# Patient Record
Sex: Female | Born: 1980 | Race: White | Hispanic: No | Marital: Married | Smoking: Current some day smoker
Health system: Southern US, Community
[De-identification: ages and names within clinical notes are randomized; demographics above are authoritative.]

## PROBLEM LIST (undated history)

## (undated) DIAGNOSIS — K449 Diaphragmatic hernia without obstruction or gangrene: Secondary | ICD-10-CM

## (undated) DIAGNOSIS — T8859XA Other complications of anesthesia, initial encounter: Secondary | ICD-10-CM

## (undated) DIAGNOSIS — E259 Adrenogenital disorder, unspecified: Secondary | ICD-10-CM

## (undated) DIAGNOSIS — J189 Pneumonia, unspecified organism: Secondary | ICD-10-CM

## (undated) DIAGNOSIS — J40 Bronchitis, not specified as acute or chronic: Secondary | ICD-10-CM

## (undated) DIAGNOSIS — K589 Irritable bowel syndrome without diarrhea: Secondary | ICD-10-CM

## (undated) DIAGNOSIS — Z720 Tobacco use: Secondary | ICD-10-CM

## (undated) DIAGNOSIS — D3501 Benign neoplasm of right adrenal gland: Secondary | ICD-10-CM

## (undated) DIAGNOSIS — E236 Other disorders of pituitary gland: Secondary | ICD-10-CM

## (undated) DIAGNOSIS — E273 Drug-induced adrenocortical insufficiency: Secondary | ICD-10-CM

## (undated) DIAGNOSIS — J45909 Unspecified asthma, uncomplicated: Secondary | ICD-10-CM

## (undated) DIAGNOSIS — K219 Gastro-esophageal reflux disease without esophagitis: Secondary | ICD-10-CM

## (undated) DIAGNOSIS — R519 Headache, unspecified: Secondary | ICD-10-CM

## (undated) DIAGNOSIS — E279 Disorder of adrenal gland, unspecified: Secondary | ICD-10-CM

## (undated) HISTORY — PX: TUBAL LIGATION: SHX77

## (undated) HISTORY — DX: Irritable bowel syndrome, unspecified: K58.9

## (undated) HISTORY — PX: APPENDECTOMY: SHX54

## (undated) HISTORY — PX: CHOLECYSTECTOMY: SHX55

## (undated) HISTORY — DX: Diaphragmatic hernia without obstruction or gangrene: K44.9

## (undated) HISTORY — PX: HIP SURGERY: SHX245

---

## 2005-05-04 ENCOUNTER — Emergency Department: Payer: Self-pay | Admitting: Internal Medicine

## 2005-05-23 ENCOUNTER — Emergency Department: Payer: Self-pay | Admitting: Emergency Medicine

## 2006-01-07 ENCOUNTER — Emergency Department: Payer: Self-pay | Admitting: Internal Medicine

## 2006-01-12 ENCOUNTER — Emergency Department: Payer: Self-pay | Admitting: Emergency Medicine

## 2006-06-05 ENCOUNTER — Emergency Department: Payer: Self-pay | Admitting: Emergency Medicine

## 2006-09-03 ENCOUNTER — Emergency Department: Payer: Self-pay | Admitting: Emergency Medicine

## 2006-09-03 ENCOUNTER — Other Ambulatory Visit: Payer: Self-pay

## 2006-10-30 ENCOUNTER — Emergency Department: Payer: Self-pay | Admitting: Emergency Medicine

## 2007-05-21 ENCOUNTER — Emergency Department: Payer: Self-pay | Admitting: Emergency Medicine

## 2009-02-10 ENCOUNTER — Ambulatory Visit: Payer: Self-pay | Admitting: Unknown Physician Specialty

## 2009-02-16 ENCOUNTER — Ambulatory Visit: Payer: Self-pay | Admitting: Unknown Physician Specialty

## 2009-10-13 ENCOUNTER — Emergency Department: Payer: Self-pay | Admitting: Emergency Medicine

## 2009-11-09 ENCOUNTER — Emergency Department: Payer: Self-pay | Admitting: Unknown Physician Specialty

## 2009-12-05 ENCOUNTER — Encounter: Payer: Self-pay | Admitting: Obstetrics and Gynecology

## 2010-07-07 ENCOUNTER — Observation Stay: Payer: Self-pay | Admitting: Obstetrics and Gynecology

## 2010-07-08 ENCOUNTER — Inpatient Hospital Stay: Payer: Self-pay

## 2010-07-12 LAB — PATHOLOGY REPORT

## 2013-01-22 DIAGNOSIS — F17200 Nicotine dependence, unspecified, uncomplicated: Secondary | ICD-10-CM | POA: Insufficient documentation

## 2013-07-15 ENCOUNTER — Ambulatory Visit: Payer: Self-pay | Admitting: Orthopedic Surgery

## 2013-07-22 ENCOUNTER — Other Ambulatory Visit: Payer: Self-pay

## 2013-07-22 LAB — CBC WITH DIFFERENTIAL/PLATELET
BASOS ABS: 0 10*3/uL (ref 0.0–0.1)
BASOS PCT: 0.6 %
EOS PCT: 7.8 %
Eosinophil #: 0.5 10*3/uL (ref 0.0–0.7)
HCT: 45.1 % (ref 35.0–47.0)
HGB: 14.7 g/dL (ref 12.0–16.0)
LYMPHS PCT: 41.7 %
Lymphocyte #: 2.8 10*3/uL (ref 1.0–3.6)
MCH: 32.7 pg (ref 26.0–34.0)
MCHC: 32.6 g/dL (ref 32.0–36.0)
MCV: 100 fL (ref 80–100)
MONO ABS: 0.7 x10 3/mm (ref 0.2–0.9)
MONOS PCT: 10.3 %
NEUTROS ABS: 2.6 10*3/uL (ref 1.4–6.5)
Neutrophil %: 39.6 %
Platelet: 253 10*3/uL (ref 150–440)
RBC: 4.51 10*6/uL (ref 3.80–5.20)
RDW: 12.2 % (ref 11.5–14.5)
WBC: 6.6 10*3/uL (ref 3.6–11.0)

## 2013-07-22 LAB — SEDIMENTATION RATE: Erythrocyte Sed Rate: 3 mm/hr (ref 0–20)

## 2014-05-21 ENCOUNTER — Other Ambulatory Visit: Payer: Self-pay | Admitting: Orthopedic Surgery

## 2014-05-21 DIAGNOSIS — M545 Low back pain, unspecified: Secondary | ICD-10-CM

## 2014-05-21 DIAGNOSIS — M5440 Lumbago with sciatica, unspecified side: Principal | ICD-10-CM

## 2014-05-28 ENCOUNTER — Ambulatory Visit
Admission: RE | Admit: 2014-05-28 | Discharge: 2014-05-28 | Disposition: A | Discharge status: Home / Self Care | Payer: BLUE CROSS/BLUE SHIELD | Source: Ambulatory Visit | Attending: Orthopedic Surgery | Admitting: Orthopedic Surgery

## 2014-05-28 DIAGNOSIS — M545 Low back pain, unspecified: Secondary | ICD-10-CM

## 2014-05-28 DIAGNOSIS — M5127 Other intervertebral disc displacement, lumbosacral region: Secondary | ICD-10-CM | POA: Diagnosis not present

## 2014-05-28 DIAGNOSIS — M5440 Lumbago with sciatica, unspecified side: Secondary | ICD-10-CM

## 2014-12-07 DIAGNOSIS — M25851 Other specified joint disorders, right hip: Secondary | ICD-10-CM | POA: Insufficient documentation

## 2015-03-11 DIAGNOSIS — E259 Adrenogenital disorder, unspecified: Secondary | ICD-10-CM | POA: Insufficient documentation

## 2015-03-23 DIAGNOSIS — Z9889 Other specified postprocedural states: Secondary | ICD-10-CM | POA: Insufficient documentation

## 2015-05-06 DIAGNOSIS — S73192A Other sprain of left hip, initial encounter: Secondary | ICD-10-CM | POA: Insufficient documentation

## 2017-01-03 ENCOUNTER — Emergency Department: Payer: BLUE CROSS/BLUE SHIELD

## 2017-01-03 ENCOUNTER — Emergency Department
Admission: EM | Admit: 2017-01-03 | Discharge: 2017-01-03 | Disposition: A | Discharge status: Home / Self Care | Payer: BLUE CROSS/BLUE SHIELD | Attending: Emergency Medicine | Admitting: Emergency Medicine

## 2017-01-03 ENCOUNTER — Other Ambulatory Visit: Payer: Self-pay

## 2017-01-03 DIAGNOSIS — B9689 Other specified bacterial agents as the cause of diseases classified elsewhere: Secondary | ICD-10-CM | POA: Insufficient documentation

## 2017-01-03 DIAGNOSIS — F17200 Nicotine dependence, unspecified, uncomplicated: Secondary | ICD-10-CM | POA: Insufficient documentation

## 2017-01-03 DIAGNOSIS — R05 Cough: Secondary | ICD-10-CM | POA: Diagnosis not present

## 2017-01-03 DIAGNOSIS — R0789 Other chest pain: Secondary | ICD-10-CM | POA: Diagnosis present

## 2017-01-03 DIAGNOSIS — N760 Acute vaginitis: Secondary | ICD-10-CM | POA: Diagnosis not present

## 2017-01-03 HISTORY — DX: Disorder of adrenal gland, unspecified: E27.9

## 2017-01-03 LAB — CBC
HEMATOCRIT: 42.3 % (ref 35.0–47.0)
Hemoglobin: 14.5 g/dL (ref 12.0–16.0)
MCH: 33.9 pg (ref 26.0–34.0)
MCHC: 34.2 g/dL (ref 32.0–36.0)
MCV: 99.2 fL (ref 80.0–100.0)
Platelets: 306 10*3/uL (ref 150–440)
RBC: 4.26 MIL/uL (ref 3.80–5.20)
RDW: 12.5 % (ref 11.5–14.5)
WBC: 14.3 10*3/uL — ABNORMAL HIGH (ref 3.6–11.0)

## 2017-01-03 LAB — URINALYSIS, ROUTINE W REFLEX MICROSCOPIC
BILIRUBIN URINE: NEGATIVE
GLUCOSE, UA: NEGATIVE mg/dL
HGB URINE DIPSTICK: NEGATIVE
Ketones, ur: NEGATIVE mg/dL
Leukocytes, UA: NEGATIVE
Nitrite: NEGATIVE
PH: 7 (ref 5.0–8.0)
Protein, ur: NEGATIVE mg/dL
Specific Gravity, Urine: 1.018 (ref 1.005–1.030)

## 2017-01-03 LAB — WET PREP, GENITAL
Sperm: NONE SEEN
Trich, Wet Prep: NONE SEEN
Yeast Wet Prep HPF POC: NONE SEEN

## 2017-01-03 LAB — TROPONIN I: Troponin I: <0.03 ng/mL (ref <0.03)

## 2017-01-03 LAB — BASIC METABOLIC PANEL
Anion gap: 8 (ref 5–15)
BUN: 7 mg/dL (ref 6–20)
CHLORIDE: 103 mmol/L (ref 101–111)
CO2: 26 mmol/L (ref 22–32)
CREATININE: 0.66 mg/dL (ref 0.44–1.00)
Calcium: 9.5 mg/dL (ref 8.9–10.3)
GFR calc Af Amer: >60 mL/min (ref >60)
GFR calc non Af Amer: >60 mL/min (ref >60)
GLUCOSE: 100 mg/dL — AB (ref 65–99)
POTASSIUM: 3.3 mmol/L — AB (ref 3.5–5.1)
Sodium: 137 mmol/L (ref 135–145)

## 2017-01-03 LAB — POCT PREGNANCY, URINE: PREG TEST UR: NEGATIVE

## 2017-01-03 LAB — FIBRIN DERIVATIVES D-DIMER (ARMC ONLY): Fibrin derivatives D-dimer (ARMC): 258.9 ng/mL (FEU) (ref 0.00–499.00)

## 2017-01-03 LAB — CHLAMYDIA/NGC RT PCR (ARMC ONLY)
CHLAMYDIA TR: NOT DETECTED
N GONORRHOEAE: NOT DETECTED

## 2017-01-03 MED ORDER — METRONIDAZOLE 500 MG PO TABS: 500.0000 mg | ORAL_TABLET | Freq: Two times a day (BID) | ORAL | 0 refills | Status: AC

## 2017-01-03 NOTE — ED Provider Notes (Signed)
[facility] Emergency Department Provider Note  Time seen: 4:39 PM  I have reviewed the triage vital signs and the nursing notes.   HISTORY  Chief Complaint Flank Pain    HPI Margaret Gates is a 37 y.o. female with no significant past medical history who presents to the emergency department for right rib pain.  According to the patient for the past 2 weeks she has been experiencing pain in the right lateral rib cage, worse with deep inspiration or cough.  Also states it is worse if you push on the area or if she lies on that side.  Denies any shortness of breath.  Denies any nausea diaphoresis.  Denies any fever.  Does state occasional cough but denies sputum production, states chronic cough due to smoking.  As a secondary complaint she has also noted some clear pink tinged vaginal discharge, denies any lower abdominal pain.  Negative for dysuria.  Past Medical History:  Diagnosis Date  . 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   Past Surgical History:  Procedure Laterality Date  . CESAREAN SECTION    . HIP SURGERY      Prior to Admission medications   Not on File    Allergies  Allergen Reactions  . Penicillins     History reviewed. No pertinent family history.  Social History Social History   Tobacco Use  . Smoking status: Current Every Day Smoker  Substance Use Topics  . Alcohol use: No    Frequency: Never  . Drug use: Not on file    Review of Systems Constitutional: Negative for fever. Eyes: Negative for visual complaints ENT: Negative for congestion Cardiovascular: Right lower chest pain Respiratory: Negative for shortness of breath.  Positive for occasional cough Gastrointestinal: Negative for abdominal pain, vomiting and diarrhea. Genitourinary: Negative for dysuria.  Positive for pink/clear vaginal discharge Musculoskeletal: Negative for back pain. Skin: Negative for  rash. Neurological: Negative for headache All other ROS negative  ____________________________________________   PHYSICAL EXAM:  VITAL SIGNS: ED Triage Vitals  Enc Vitals Group     BP 01/03/17 1407 131/89     Pulse Rate 01/03/17 1407 (!) 105     Resp 01/03/17 1407 20     Temp 01/03/17 1407 98.5 F (36.9 C)     Temp Source 01/03/17 1407 Oral     SpO2 01/03/17 1407 98 %     Weight 01/03/17 1408 165 lb (74.8 kg)     Height 01/03/17 1408 5\' 7"  (1.702 m)     Head Circumference --      Peak Flow --      Pain Score 01/03/17 1421 5     Pain Loc --      Pain Edu? --      Excl. in [place]? --    Constitutional: Alert and oriented. Well appearing and in no distress. Eyes: Normal exam ENT   Head: Normocephalic and atraumatic   Mouth/Throat: Mucous membranes are moist. Cardiovascular: Normal rate, regular rhythm. No murmur Respiratory: Normal respiratory effort without tachypnea nor retractions. Breath sounds are clear.  Moderate right lateral chest wall tenderness to palpation. Gastrointestinal: Soft and nontender. No distention.   Musculoskeletal: Nontender with normal range of motion in all extremities.  No lower extremity edema or tenderness to palpation. Neurologic:  Normal speech and language. No gross focal neurologic deficits Skin:  Skin is warm, dry and intact.  Psychiatric: Mood and affect are  normal.   ____________________________________________    EKG  EKG reviewed and interpreted by myself shows normal sinus rhythm at 90 bpm with a narrow QRS, normal axis, normal intervals, no concerning ST changes.  ____________________________________________    RADIOLOGY  X-ray negative  ____________________________________________   INITIAL IMPRESSION / ASSESSMENT AND PLAN / ED COURSE  Pertinent labs & imaging results that were available during my care of the patient were reviewed by me and considered in my medical decision making (see chart for details).  Patient  presents the emergency department for right lateral chest wall pain worse with deep inspiration, palpation or when lying on that side.  Differential would include musculoskeletal pain, rib contusion or fracture, pulmonary embolism, less likely ACS.  Patient's workup thus far is normal, labs are largely within normal limits, chest x-ray is normal.  We will obtain an EKG, troponin and a d-dimer to further evaluate the chest pain given the pleuritic nature.  However as the patient's pain is very reproducible and worse when lying on that side or palpation I highly suspect musculoskeletal discomfort.  Like exam shows mild amount of clear/brown tinged discharge.  We will send swabs.  Troponin is negative.  D-dimer is negative.  Patient is positive for bacterial vaginitis on wet prep.  We will discharge with Flagyl.  Overall the patient appears very well, no distress highly suspect right-sided musculoskeletal/chest wall pain.  ____________________________________________   FINAL CLINICAL IMPRESSION(S) / ED DIAGNOSES  Right chest wall pain Bacterial vaginosis   Harvest Dark, MD 01/03/17 1850

## 2017-01-03 NOTE — ED Triage Notes (Addendum)
Pt states R sided pain x few weeks. States nausea. Denies fever, vomiting, diarrhea. Pt states urinary frequency is normal for her. Pt also c/o of intermittent periods with "clear looking fluid with some blood in it." pt c/o of pain at R rib cage. Denies any trauma to area. States pain with deep breath.

## 2018-01-01 HISTORY — PX: UPPER GI ENDOSCOPY: SHX6162

## 2018-07-10 ENCOUNTER — Other Ambulatory Visit: Payer: Self-pay | Admitting: *Deleted

## 2018-07-10 DIAGNOSIS — Z20822 Contact with and (suspected) exposure to covid-19: Secondary | ICD-10-CM

## 2018-07-14 LAB — NOVEL CORONAVIRUS, NAA: SARS-CoV-2, NAA: NOT DETECTED

## 2018-11-25 ENCOUNTER — Other Ambulatory Visit: Payer: Self-pay

## 2018-11-25 DIAGNOSIS — Z20822 Contact with and (suspected) exposure to covid-19: Secondary | ICD-10-CM

## 2018-11-26 LAB — NOVEL CORONAVIRUS, NAA: SARS-CoV-2, NAA: NOT DETECTED

## 2018-12-22 ENCOUNTER — Telehealth: Payer: BLUE CROSS/BLUE SHIELD

## 2019-01-31 IMAGING — CR DG RIBS W/ CHEST 3+V*R*
1 series · 5 of 5 positions shown · non-contrast
Comparison: Chest x-ray 06/13/2012.

CLINICAL DATA: Rib pain.  No known injury.

EXAM:
RIGHT RIBS AND CHEST - 3+ VIEW

[Series 1: dg ribs unilateral w/chest right · 0.14mm/px · 5 of 5 slices shown]
[im 1/5]
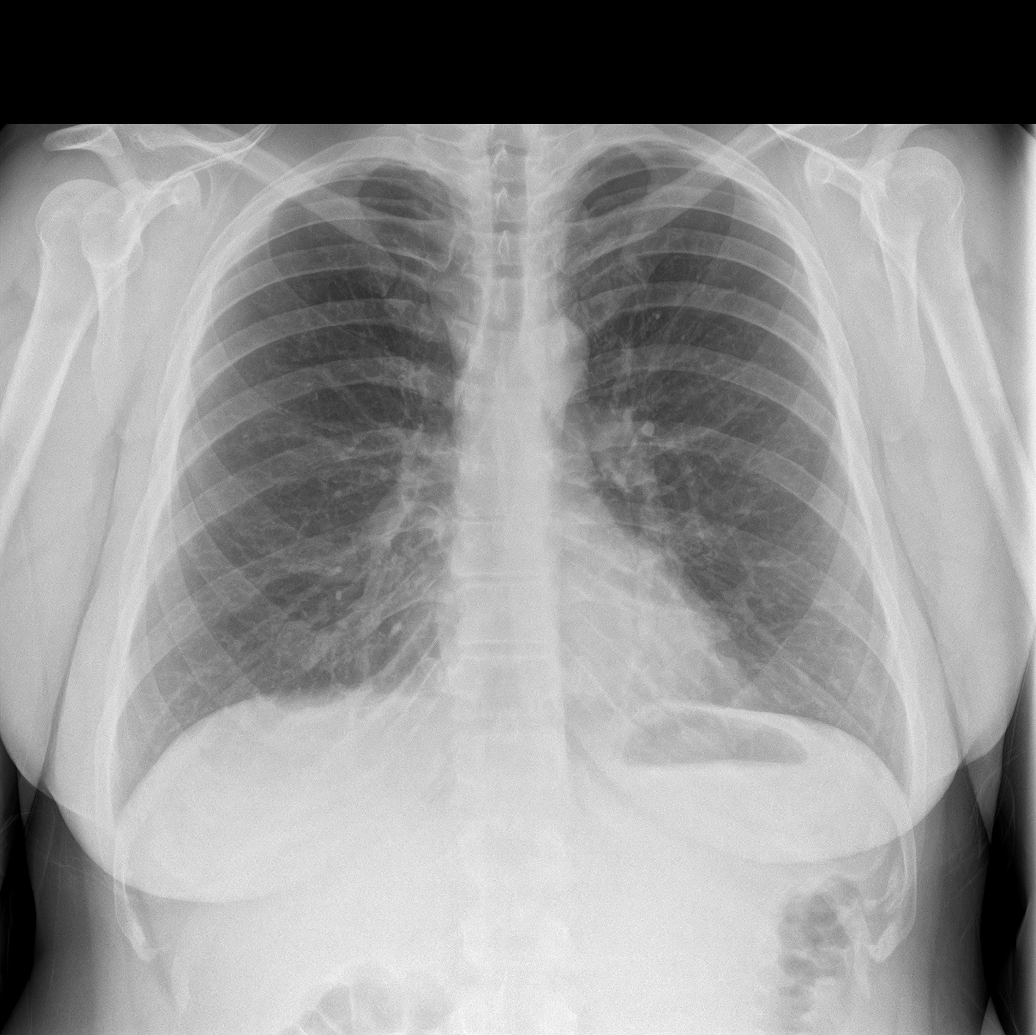
[im 2/5]
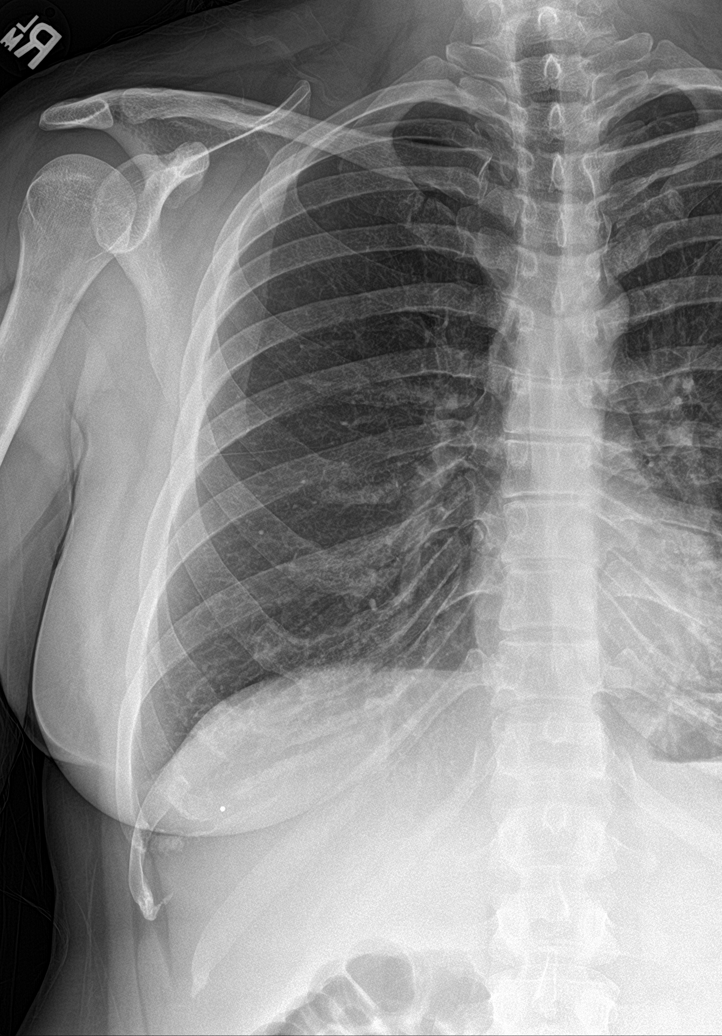
[im 3/5]
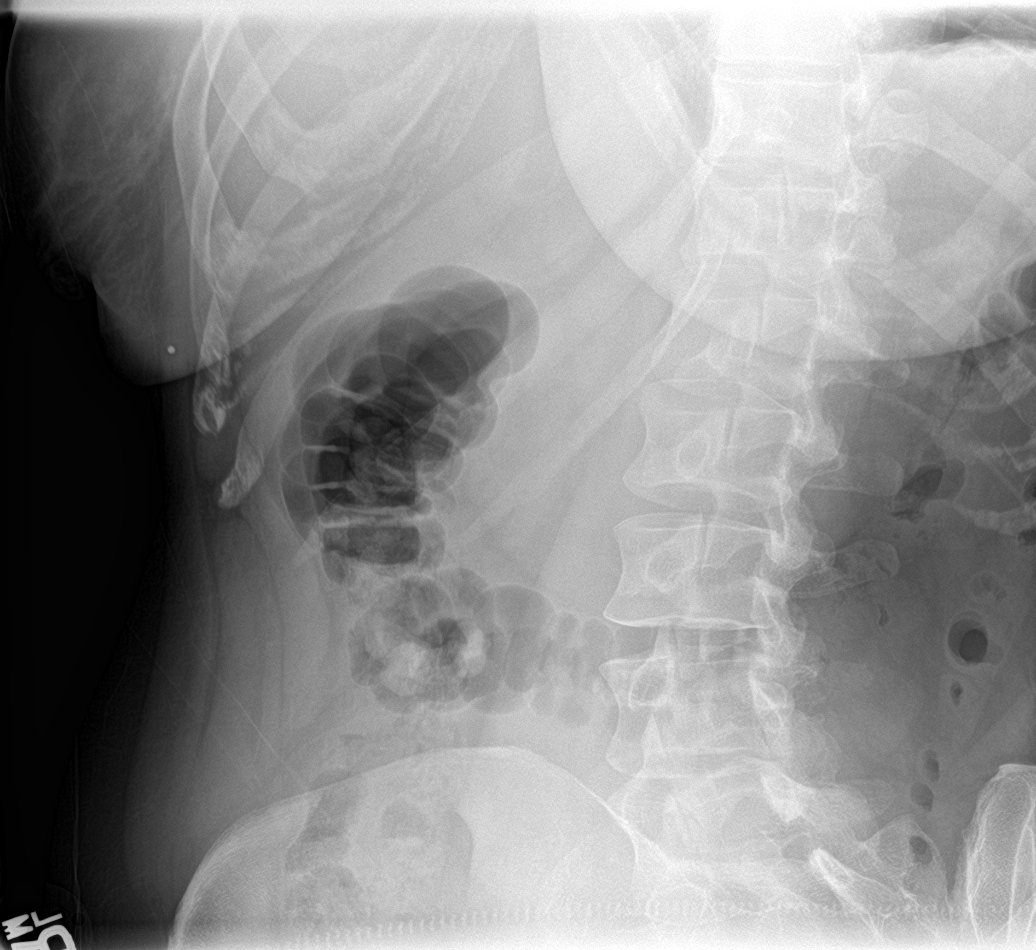
[im 4/5]
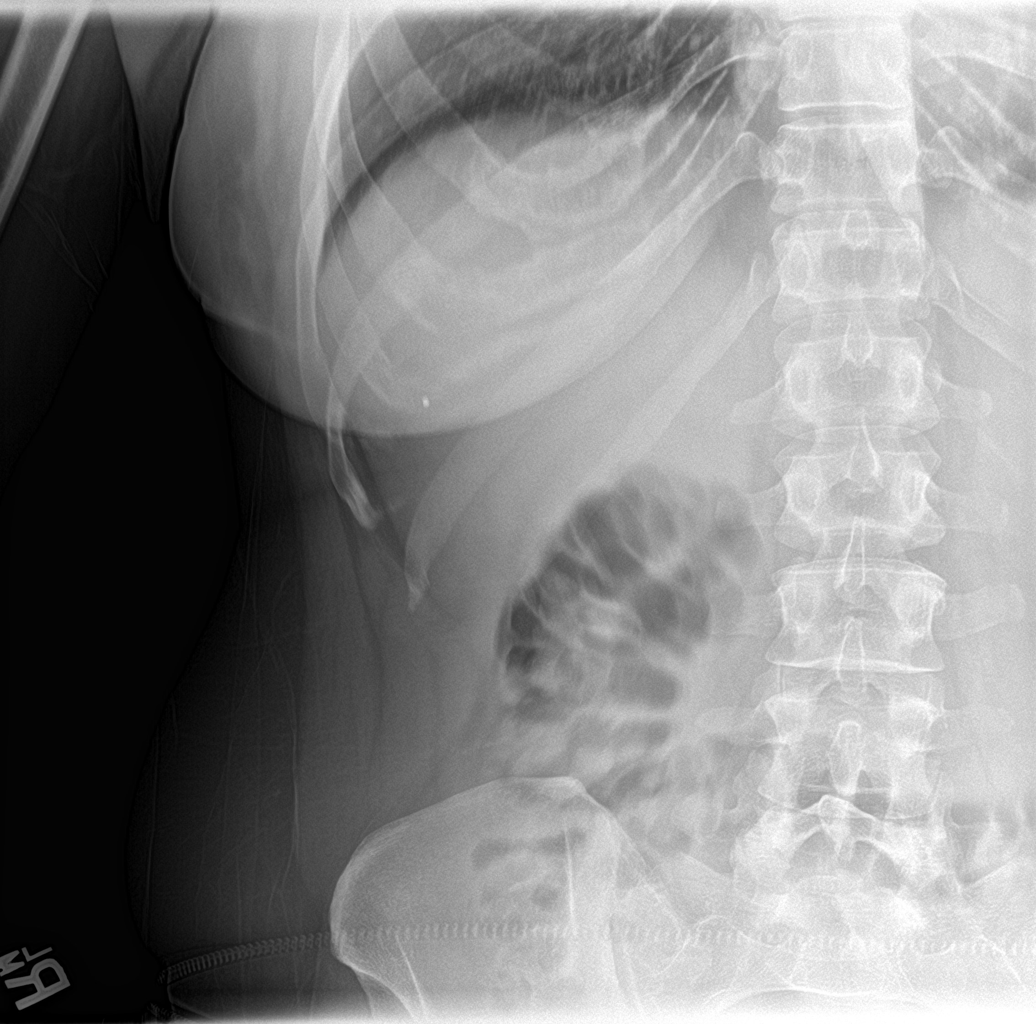
[im 5/5]
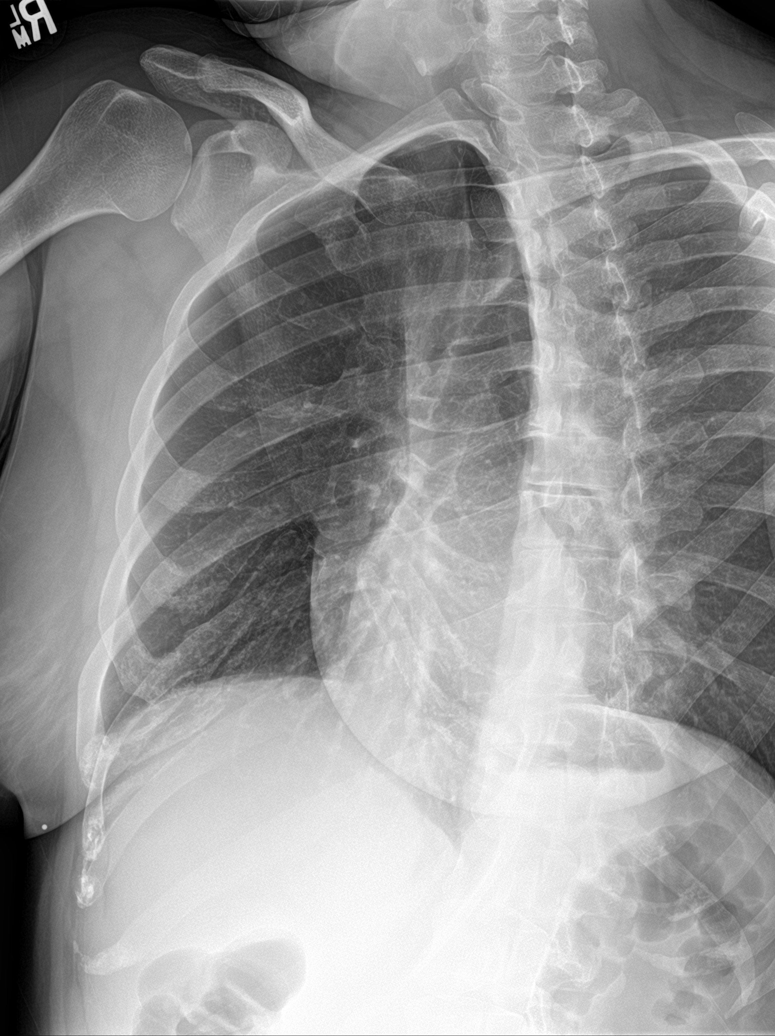

[5 of 5 positions shown; findings below may reference images not displayed]

FINDINGS: Mediastinum and hilar structures normal. Heart size normal. No focal
infiltrate. No pleural effusion or pneumothorax. No displaced rib
fracture or focal bony abnormality identified. Mild thoracic spine
scoliosis. No pneumothorax.
IMPRESSION: No acute or focal abnormality identified.

## 2020-08-19 DIAGNOSIS — G5601 Carpal tunnel syndrome, right upper limb: Secondary | ICD-10-CM | POA: Insufficient documentation

## 2020-08-23 ENCOUNTER — Other Ambulatory Visit: Payer: Self-pay | Admitting: Surgery

## 2020-08-26 ENCOUNTER — Other Ambulatory Visit: Payer: Self-pay

## 2020-08-26 ENCOUNTER — Other Ambulatory Visit
Admission: RE | Admit: 2020-08-26 | Discharge: 2020-08-26 | Disposition: A | Discharge status: Home / Self Care | Payer: BC Managed Care – PPO | Source: Ambulatory Visit | Attending: Surgery | Admitting: Surgery

## 2020-08-26 HISTORY — DX: Bronchitis, not specified as acute or chronic: J40

## 2020-08-26 HISTORY — DX: Headache, unspecified: R51.9

## 2020-08-26 NOTE — Progress Notes (Signed)
Perioperative Services Pre-Admission/Anesthesia Testing   Date: 08/26/20 Name: Margaret Gates MRN:   [id number]  Re: Consideration of preoperative prophylactic antibiotic change   Request sent to: Poggi, Marshall Cork, MD (routed and/or faxed via [facility])  Planned Surgical Procedure(s):    Case: [id number] Date/Time: 08/31/20 1050   Procedure: CARPAL TUNNEL RELEASE ENDOSCOPIC (Right: Wrist)   Anesthesia type: Choice   Pre-op diagnosis: CARPAL TUNNEL SYNDROME, RIGHT   Location: [place] 01 / [place] ORS FOR ANESTHESIA GROUP   Surgeons: Corky Mull, MD     Notes: Patient has a documented allergy to PCN  The actual reaction to PCN is unknown.  Received cephalosporin with no documented complications CEFAZOLIN received on 03/23/2015 and 06/20/2015  Screened as appropriate for cephalosporin use during medication reconciliation No immediate angioedema, dysphagia, SOB, anaphylaxis symptoms. No severe rash involving mucous membranes or skin necrosis. No hospital admissions related to side effects of PCN/cephalosporin use.  No documented reaction to PCN or cephalosporin in the last 10 years.  Request:  As an evidence based approach to reducing the rate of incidence for post-operative SSI and the development of MDROs, could an agent with narrower coverage for preoperative prophylaxis in this patient's upcoming surgical course be considered?   Currently ordered preoperative prophylactic ABX: clindamycin.   Specifically requesting change to cephalosporin (CEFAZOLIN).   Please communicate decision with me and I will change the orders in Epic as per your direction.   Things to consider: Many patients report that they were "allergic" to PCN earlier in life, however this does not translate into a true lifelong allergy. Patients can lose sensitivity to specific IgE antibodies over time if PCN is avoided (Kleris & Lugar, 2019).  Up to 10% of the adult population and 15% of hospitalized patients  report an allergy to PCN, however clinical studies suggest that 90% of those reporting an allergy can tolerate PCN antibiotics (Kleris & Lugar, 2019).  Cross-sensitivity between PCN and cephalosporins has been documented as being as high as 10%, however this estimation included data believed to have been collected in a setting where there was contamination. Newer data suggests that the prevalence of cross-sensitivity between PCN and cephalosporins is actually estimated to be closer to 1% (Hermanides et al., 2018).   Patients labeled as PCN allergic, whether they are truly allergic or not, have been found to have inferior outcomes in terms of rates of serious infection, and these patients tend to have longer hospital stays ([place], 2019).  Treatment related secondary infections, such as Clostridioides difficile, have been linked to the improper use of broad spectrum antibiotics in patients improperly labeled as PCN allergic (Kleris & Lugar, 2019).  Anaphylaxis from cephalosporins is rare and the evidence suggests that there is no increased risk of an anaphylactic type reaction when cephalosporins are used in a PCN allergic patient (Pichichero, 2006).  Citations: Hermanides J, Lemkes BA, Prins Pearla Dubonnet MW, Terreehorst I. Presumed ?-Lactam Allergy and Cross-reactivity in the Operating Theater: A Practical Approach. Anesthesiology. 2018 Aug;129(2):335-342. doi: 10.1097/ALN.0000000000002252. PMID: [id number].  Kleris, [place]., & Lugar, P. L. (2019). Things We Do For No Reason: Failing to Question a Penicillin Allergy History. Journal of hospital medicine, 14(10), [phone]. Advance online publication. https://www.wallace-middleton.info/  Pichichero, M. E. (2006). Cephalosporins can be prescribed safely for penicillin-allergic patients. Journal of family medicine, 55(2), 106-112. Accessed: https://cdn.mdedge.com/files/s4f-public/Document/September-2017/5502JFP_AppliedEvidence1.pdf   BHonor Loh MSN,  APRN, FNP-C, CEN [facility] Per[facility] Nurse Practitioner FAX: [phone]/26/22 1:03 PM

## 2020-08-26 NOTE — Patient Instructions (Addendum)
Your procedure is scheduled on: 08/31/20 Report to the Registration Desk on the 1st floor of the [facility]. To find out your arrival time, please call [phone] between 1PM - 3PM on:08/30/20   REMEMBER: Instructions that are not followed completely may result in serious medical risk, up to and including death; or upon the discretion of your surgeon and anesthesiologist your surgery may need to be rescheduled.  Do not eat food after midnight the night before surgery.  No gum chewing, lozengers or hard candies.  You may however, drink CLEAR liquids up to 2 hours before you are scheduled to arrive for your surgery. Do not drink anything within 2 hours of your scheduled arrival time.  Clear liquids include: - water  - apple juice without pulp - gatorade (not RED, PURPLE, OR BLUE) - black coffee or tea (Do NOT add milk or creamers to the coffee or tea) Do NOT drink anything that is not on this list.  TAKE THESE MEDICATIONS THE MORNING OF SURGERY WITH A SIP OF WATER:   - omeprazole (PRILOSEC OTC) 20 MG tablet - fludrocortisone (FLORINEF) 0.1 MG tablet - hydrocortisone (CORTEF) 10 MG tablet  Use albuterol (VENTOLIN HFA) 108 (90 Base) MCG/ACT inhaler on the day of surgery and bring to the hospital.  One week prior to surgery: Stop Anti-inflammatories (NSAIDS) such as Advil, Aleve, Ibuprofen, Motrin, Naproxen, Naprosyn and Aspirin based products such as Excedrin, Goodys Powder, BC Powder.  Stop ANY OVER THE COUNTER supplements until after surgery.  You may however, continue to take Tylenol if needed for pain up until the day of surgery.  No Alcohol for 24 hours before or after surgery.  No Smoking including e-cigarettes for 24 hours prior to surgery.  No chewable tobacco products for at least 6 hours prior to surgery.  No nicotine patches on the day of surgery.  Do not use any "recreational" drugs for at least a week prior to your surgery.  Please be advised that the  combination of cocaine and anesthesia may have negative outcomes, up to and including death. If you test positive for cocaine, your surgery will be cancelled.  On the morning of surgery brush your teeth with toothpaste and water, you may rinse your mouth with mouthwash if you wish. Do not swallow any toothpaste or mouthwash.  Do not wear jewelry, make-up, hairpins, clips or nail polish.  Do not wear lotions, powders, or perfumes.   Do not shave body from the neck down 48 hours prior to surgery just in case you cut yourself which could leave a site for infection.  Also, freshly shaved skin may become irritated if using the CHG soap.  Contact lenses, hearing aids and dentures may not be worn into surgery.  Do not bring valuables to the hospital. [facility] is not responsible for any missing/lost belongings or valuables.   Notify your doctor if there is any change in your medical condition (cold, fever, infection).  Wear comfortable clothing (specific to your surgery type) to the hospital.  After surgery, you can help prevent lung complications by doing breathing exercises.  Take deep breaths and cough every 1-2 hours. Your doctor may order a device called an Incentive Spirometer to help you take deep breaths. When coughing or sneezing, hold a pillow firmly against your incision with both hands. This is called "splinting." Doing this helps protect your incision. It also decreases belly discomfort.  If you are being admitted to the hospital overnight, leave your suitcase in the  car. After surgery it may be brought to your room.  If you are being discharged the day of surgery, you will not be allowed to drive home. You will need a responsible adult (18 years or older) to drive you home and stay with you that night.   If you are taking public transportation, you will need to have a responsible adult (18 years or older) with you. Please confirm with your physician that it is acceptable to  use public transportation.   Please call the [place] Dept. at [phone] if you have any questions about these instructions.  Surgery Visitation Policy:  Patients undergoing a surgery or procedure may have one family member or support person with them as long as that person is not COVID-19 positive or experiencing its symptoms.  That person may remain in the waiting area during the procedure.  Inpatient Visitation:    Visiting hours are 7 a.m. to 8 p.m. Inpatients will be allowed two visitors daily. The visitors may change each day during the patient's stay. No visitors under the age of 24. Any visitor under the age of 82 must be accompanied by an adult. The visitor must pass COVID-19 screenings, use hand sanitizer when entering and exiting the patient's room and wear a mask at all times, including in the patient's room. Patients must also wear a mask when staff or their visitor are in the room. Masking is required regardless of vaccination status.

## 2020-08-29 NOTE — Progress Notes (Signed)
  Perioperative Services Pre-Admission/Anesthesia Testing     Date: 08/29/20  Name: Margaret Gates MRN:   [id number]  Re: Change in [place] for upcoming surgery   Case: [id number] Date/Time: 08/31/20 1050   Procedure: CARPAL TUNNEL RELEASE ENDOSCOPIC (Right: Wrist)   Anesthesia type: Choice   Pre-op diagnosis: CARPAL TUNNEL SYNDROME, RIGHT   Location: [facility] OR ROOM 01 / [place] ORS FOR ANESTHESIA GROUP   Surgeons: Corky Mull, MD     Primary attending surgeon was consulted regarding consideration of therapeutic change in antimicrobial agent being used for preoperative prophylaxis in this patient's upcoming surgical case. Following analysis of the risk versus benefits, Dr. Roland Rack, Marshall Cork, MD advising that it would be acceptable to discontinue the ordered clindamycin and place an order for cefazolin 2 gm IV on call to the OR. Orders for this patient were amended by me following collaborative conversation with attending surgeon taking into consideration of risk versus benefits of the therapy change.  Honor Loh, MSN, APRN, FNP-C, CEN [facility]  Peri-operative Services Nurse Practitioner Phone: [phone] 08/29/20 8:52 AM

## 2020-08-30 MED ORDER — ORAL CARE MOUTH RINSE: 15.0000 mL | Freq: Once | OROMUCOSAL | Status: AC

## 2020-08-30 MED ORDER — CHLORHEXIDINE GLUCONATE 0.12 % MT SOLN: 15.0000 mL | Freq: Once | OROMUCOSAL | Status: AC

## 2020-08-30 MED ORDER — CEFAZOLIN SODIUM-DEXTROSE 2-4 GM/100ML-% IV SOLN
2.0000 g | Freq: Once | INTRAVENOUS | Status: AC
  Administered 2020-08-31: 2 g via INTRAVENOUS

## 2020-08-30 MED ORDER — CLINDAMYCIN PHOSPHATE 900 MG/50ML IV SOLN: 900.0000 mg | INTRAVENOUS | Status: DC

## 2020-08-30 MED ORDER — LACTATED RINGERS IV SOLN: INTRAVENOUS | Status: DC

## 2020-08-31 ENCOUNTER — Encounter
Admission: RE | Disposition: A | Discharge status: Home / Self Care | Payer: Self-pay | Source: Home / Self Care | Attending: Surgery

## 2020-08-31 ENCOUNTER — Ambulatory Visit: Payer: BC Managed Care – PPO | Admitting: Urgent Care

## 2020-08-31 ENCOUNTER — Ambulatory Visit
Admission: RE | Admit: 2020-08-31 | Discharge: 2020-08-31 | Disposition: A | Discharge status: Home / Self Care | Payer: BC Managed Care – PPO | Attending: Surgery | Admitting: Surgery

## 2020-08-31 ENCOUNTER — Encounter: Payer: Self-pay | Admitting: Surgery

## 2020-08-31 ENCOUNTER — Other Ambulatory Visit: Payer: Self-pay

## 2020-08-31 DIAGNOSIS — Z888 Allergy status to other drugs, medicaments and biological substances status: Secondary | ICD-10-CM | POA: Insufficient documentation

## 2020-08-31 DIAGNOSIS — Z880 Allergy status to penicillin: Secondary | ICD-10-CM | POA: Insufficient documentation

## 2020-08-31 DIAGNOSIS — G5603 Carpal tunnel syndrome, bilateral upper limbs: Secondary | ICD-10-CM | POA: Insufficient documentation

## 2020-08-31 DIAGNOSIS — Z79899 Other long term (current) drug therapy: Secondary | ICD-10-CM | POA: Diagnosis not present

## 2020-08-31 HISTORY — PX: CARPAL TUNNEL RELEASE: SHX101

## 2020-08-31 LAB — POCT PREGNANCY, URINE: Preg Test, Ur: NEGATIVE

## 2020-08-31 SURGERY — RELEASE, CARPAL TUNNEL, ENDOSCOPIC
Anesthesia: General | Site: Wrist | Laterality: Right

## 2020-08-31 MED ORDER — HYDROCODONE-ACETAMINOPHEN 5-325 MG PO TABS: 1.0000 | ORAL_TABLET | Freq: Four times a day (QID) | ORAL | 0 refills | Status: DC | PRN

## 2020-08-31 MED ORDER — LIDOCAINE HCL (CARDIAC) PF 100 MG/5ML IV SOSY
PREFILLED_SYRINGE | INTRAVENOUS | Status: DC | PRN
  Administered 2020-08-31: 60 mg via INTRAVENOUS

## 2020-08-31 MED ORDER — RACEPINEPHRINE HCL 2.25 % IN NEBU
INHALATION_SOLUTION | RESPIRATORY_TRACT | Status: AC
  Administered 2020-08-31: 0.5 mL via RESPIRATORY_TRACT
  Filled 2020-08-31: qty 0.5

## 2020-08-31 MED ORDER — ONDANSETRON HCL 4 MG/2ML IJ SOLN
INTRAMUSCULAR | Status: DC | PRN
  Administered 2020-08-31: 4 mg via INTRAVENOUS

## 2020-08-31 MED ORDER — CHLORHEXIDINE GLUCONATE 0.12 % MT SOLN
OROMUCOSAL | Status: AC
  Administered 2020-08-31: 15 mL via OROMUCOSAL
  Filled 2020-08-31: qty 15

## 2020-08-31 MED ORDER — ONDANSETRON HCL 4 MG PO TABS: 4.0000 mg | ORAL_TABLET | Freq: Four times a day (QID) | ORAL | Status: DC | PRN

## 2020-08-31 MED ORDER — MIDAZOLAM HCL 2 MG/2ML IJ SOLN
INTRAMUSCULAR | Status: DC | PRN
  Administered 2020-08-31: 2 mg via INTRAVENOUS

## 2020-08-31 MED ORDER — 0.9 % SODIUM CHLORIDE (POUR BTL) OPTIME
TOPICAL | Status: DC | PRN
  Administered 2020-08-31: 500 mL

## 2020-08-31 MED ORDER — FAMOTIDINE 20 MG PO TABS
ORAL_TABLET | ORAL | Status: AC
  Filled 2020-08-31: qty 1

## 2020-08-31 MED ORDER — CEFAZOLIN SODIUM-DEXTROSE 2-4 GM/100ML-% IV SOLN
INTRAVENOUS | Status: AC
  Filled 2020-08-31: qty 100

## 2020-08-31 MED ORDER — BUPIVACAINE HCL (PF) 0.5 % IJ SOLN
INTRAMUSCULAR | Status: DC | PRN
  Administered 2020-08-31: 10 mL

## 2020-08-31 MED ORDER — POTASSIUM CHLORIDE IN NACL 20-0.9 MEQ/L-% IV SOLN
INTRAVENOUS | Status: DC
  Filled 2020-08-31 (×3): qty 1000

## 2020-08-31 MED ORDER — DEXMEDETOMIDINE (PRECEDEX) IN NS 20 MCG/5ML (4 MCG/ML) IV SYRINGE
PREFILLED_SYRINGE | INTRAVENOUS | Status: DC | PRN
  Administered 2020-08-31: 12 ug via INTRAVENOUS

## 2020-08-31 MED ORDER — ONDANSETRON HCL 4 MG/2ML IJ SOLN: 4.0000 mg | Freq: Four times a day (QID) | INTRAMUSCULAR | Status: DC | PRN

## 2020-08-31 MED ORDER — PROPOFOL 10 MG/ML IV BOLUS
INTRAVENOUS | Status: DC | PRN
  Administered 2020-08-31: 50 mg via INTRAVENOUS
  Administered 2020-08-31: 150 mg via INTRAVENOUS

## 2020-08-31 MED ORDER — PROPOFOL 10 MG/ML IV BOLUS
INTRAVENOUS | Status: AC
  Filled 2020-08-31: qty 20

## 2020-08-31 MED ORDER — FENTANYL CITRATE (PF) 100 MCG/2ML IJ SOLN: 25.0000 ug | INTRAMUSCULAR | Status: DC | PRN

## 2020-08-31 MED ORDER — RACEPINEPHRINE HCL 2.25 % IN NEBU: 0.5000 mL | INHALATION_SOLUTION | Freq: Once | RESPIRATORY_TRACT | Status: AC

## 2020-08-31 MED ORDER — CLINDAMYCIN PHOSPHATE 900 MG/50ML IV SOLN
INTRAVENOUS | Status: AC
  Filled 2020-08-31: qty 50

## 2020-08-31 MED ORDER — ALBUTEROL SULFATE (2.5 MG/3ML) 0.083% IN NEBU: 2.5000 mg | INHALATION_SOLUTION | Freq: Four times a day (QID) | RESPIRATORY_TRACT | Status: DC | PRN

## 2020-08-31 MED ORDER — HYDROCODONE-ACETAMINOPHEN 5-325 MG PO TABS
ORAL_TABLET | ORAL | Status: AC
  Filled 2020-08-31: qty 1

## 2020-08-31 MED ORDER — ONDANSETRON HCL 4 MG/2ML IJ SOLN: 4.0000 mg | Freq: Once | INTRAMUSCULAR | Status: DC | PRN

## 2020-08-31 MED ORDER — HYDROCORTISONE NA SUCCINATE PF 100 MG IJ SOLR
INTRAMUSCULAR | Status: DC | PRN
  Administered 2020-08-31: 100 mg via INTRAVENOUS

## 2020-08-31 MED ORDER — SUCCINYLCHOLINE CHLORIDE 200 MG/10ML IV SOSY
PREFILLED_SYRINGE | INTRAVENOUS | Status: DC | PRN
  Administered 2020-08-31: 100 mg via INTRAVENOUS

## 2020-08-31 MED ORDER — FENTANYL CITRATE (PF) 100 MCG/2ML IJ SOLN
INTRAMUSCULAR | Status: AC
  Filled 2020-08-31: qty 2

## 2020-08-31 MED ORDER — RACEPINEPHRINE HCL 2.25 % IN NEBU
INHALATION_SOLUTION | RESPIRATORY_TRACT | Status: AC
  Filled 2020-08-31: qty 0.5

## 2020-08-31 MED ORDER — METOCLOPRAMIDE HCL 5 MG/ML IJ SOLN: 5.0000 mg | Freq: Three times a day (TID) | INTRAMUSCULAR | Status: DC | PRN

## 2020-08-31 MED ORDER — METOCLOPRAMIDE HCL 10 MG PO TABS: 5.0000 mg | ORAL_TABLET | Freq: Three times a day (TID) | ORAL | Status: DC | PRN

## 2020-08-31 MED ORDER — BUPIVACAINE HCL (PF) 0.5 % IJ SOLN
INTRAMUSCULAR | Status: AC
  Filled 2020-08-31: qty 30

## 2020-08-31 MED ORDER — MIDAZOLAM HCL 2 MG/2ML IJ SOLN
INTRAMUSCULAR | Status: AC
  Filled 2020-08-31: qty 2

## 2020-08-31 MED ORDER — HYDROCODONE-ACETAMINOPHEN 5-325 MG PO TABS
1.0000 | ORAL_TABLET | ORAL | Status: DC | PRN
  Administered 2020-08-31: 1 via ORAL

## 2020-08-31 MED ORDER — ALBUTEROL SULFATE (2.5 MG/3ML) 0.083% IN NEBU
INHALATION_SOLUTION | RESPIRATORY_TRACT | Status: AC
  Administered 2020-08-31: 2.5 mg via RESPIRATORY_TRACT
  Filled 2020-08-31: qty 3

## 2020-08-31 MED ORDER — RACEPINEPHRINE HCL 2.25 % IN NEBU
0.5000 mL | INHALATION_SOLUTION | Freq: Once | RESPIRATORY_TRACT | Status: AC
  Administered 2020-08-31: 0.5 mL via RESPIRATORY_TRACT

## 2020-08-31 MED ORDER — FENTANYL CITRATE (PF) 100 MCG/2ML IJ SOLN
INTRAMUSCULAR | Status: DC | PRN
  Administered 2020-08-31: 100 ug via INTRAVENOUS

## 2020-08-31 SURGICAL SUPPLY — 32 items
BNDG COHESIVE 4X5 TAN ST LF (GAUZE/BANDAGES/DRESSINGS) ×2 IMPLANT
BNDG ELASTIC 2X5.8 VLCR STR LF (GAUZE/BANDAGES/DRESSINGS) ×2 IMPLANT
BNDG ESMARK 4X12 TAN STRL LF (GAUZE/BANDAGES/DRESSINGS) ×2 IMPLANT
CHLORAPREP W/TINT 26 (MISCELLANEOUS) ×2 IMPLANT
CORD BIP STRL DISP 12FT (MISCELLANEOUS) ×2 IMPLANT
CUFF TOURN SGL QUICK 18X4 (TOURNIQUET CUFF) IMPLANT
DRAPE SURG 17X11 SM STRL (DRAPES) ×2 IMPLANT
FORCEPS JEWEL BIP 4-3/4 STR (INSTRUMENTS) ×2 IMPLANT
GAUZE 4X4 16PLY ~~LOC~~+RFID DBL (SPONGE) ×2 IMPLANT
GAUZE SPONGE 4X4 12PLY STRL (GAUZE/BANDAGES/DRESSINGS) ×2 IMPLANT
GAUZE XEROFORM 1X8 LF (GAUZE/BANDAGES/DRESSINGS) ×2 IMPLANT
GLOVE SURG ENC MOIS LTX SZ8 (GLOVE) ×2 IMPLANT
GLOVE SURG UNDER LTX SZ8 (GLOVE) ×2 IMPLANT
GOWN STRL REUS W/ TWL LRG LVL3 (GOWN DISPOSABLE) ×1 IMPLANT
GOWN STRL REUS W/ TWL XL LVL3 (GOWN DISPOSABLE) ×1 IMPLANT
GOWN STRL REUS W/TWL LRG LVL3 (GOWN DISPOSABLE) ×1
GOWN STRL REUS W/TWL XL LVL3 (GOWN DISPOSABLE) ×1
KIT CARPAL TUNNEL (MISCELLANEOUS) ×1
KIT ESCP INSRT D SLOT CANN KN (MISCELLANEOUS) ×1 IMPLANT
KIT TURNOVER KIT A (KITS) ×2 IMPLANT
MANIFOLD NEPTUNE II (INSTRUMENTS) ×2 IMPLANT
NS IRRIG 500ML POUR BTL (IV SOLUTION) ×2 IMPLANT
PACK EXTREMITY ARMC (MISCELLANEOUS) ×2 IMPLANT
SPLINT WRIST LG LT TX990309 (SOFTGOODS) IMPLANT
SPLINT WRIST LG RT TX900304 (SOFTGOODS) IMPLANT
SPLINT WRIST M LT TX990308 (SOFTGOODS) IMPLANT
SPLINT WRIST M RT TX990303 (SOFTGOODS) ×2 IMPLANT
SPLINT WRIST XL LT TX990310 (SOFTGOODS) IMPLANT
SPLINT WRIST XL RT TX990305 (SOFTGOODS) IMPLANT
STOCKINETTE IMPERVIOUS 9X36 MD (GAUZE/BANDAGES/DRESSINGS) ×2 IMPLANT
SUT PROLENE 4 0 PS 2 18 (SUTURE) ×2 IMPLANT
WATER STERILE IRR 500ML POUR (IV SOLUTION) ×2 IMPLANT

## 2020-08-31 NOTE — Anesthesia Postprocedure Evaluation (Signed)
Anesthesia Post Note  Patient: Margaret Gates  Procedure(s) Performed: CARPAL TUNNEL RELEASE ENDOSCOPIC (Right: Wrist)  Patient location during evaluation: PACU Anesthesia Type: General Level of consciousness: awake and alert Pain management: pain level controlled Vital Signs Assessment: post-procedure vital signs reviewed and stable Respiratory status: spontaneous breathing, nonlabored ventilation, respiratory function stable and patient connected to nasal cannula oxygen Cardiovascular status: blood pressure returned to baseline and stable Postop Assessment: no apparent nausea or vomiting Anesthetic complications: no Comments: Called to pacu secondary to laryngospasm.  Sats to 80s and cyanotic. BVM intitiated and sux 20 mg iv given to break.  Able to ventilate with sats to low 90s within 45 seconds.  Epi neb x2 and albuterol yielded resolution with return to spontaneous ventilation. Significant amount of copious secretions suctioned from mouth and OP. Pt. Alert and oriented and stable  With sats in mid 90s. ja   No notable events documented.   Last Vitals:  Vitals:   08/31/20 1403 08/31/20 1405  BP:    Pulse: (!) 117 (!) 101  Resp: 18 16  Temp:    SpO2: (!) 67% 90%    Last Pain:  Vitals:   08/31/20 1115  TempSrc: Oral  PainSc: [place] Tomie Spizzirri

## 2020-08-31 NOTE — Anesthesia Procedure Notes (Signed)
Procedure Name: Intubation Date/Time: 08/31/2020 1:22 PM Performed by: Posey Pronto, Duha Abair, CRNA Pre-anesthesia Checklist: Patient identified, Patient being monitored, Timeout performed, Emergency Drugs available and Suction available Patient Re-evaluated:Patient Re-evaluated prior to induction Oxygen Delivery Method: Circle system utilized Preoxygenation: Pre-oxygenation with 100% oxygen Induction Type: IV induction Ventilation: Mask ventilation without difficulty Laryngoscope Size: 3 and McGraph Grade View: Grade I Tube type: Oral Tube size: 7.5 mm Number of attempts: 1 Airway Equipment and Method: Stylet Placement Confirmation: ETT inserted through vocal cords under direct vision, positive ETCO2 and breath sounds checked- equal and bilateral Secured at: 22 cm Tube secured with: Tape Dental Injury: Teeth and Oropharynx as per pre-operative assessment  Comments: Eyes taped prior to intubation

## 2020-08-31 NOTE — Anesthesia Preprocedure Evaluation (Signed)
Anesthesia Evaluation  Patient identified by MRN, date of birth, ID band Patient awake    Reviewed: Allergy & Precautions, H&P , NPO status , Patient's Chart, lab work & pertinent test results, reviewed documented beta blocker date and time   Airway Mallampati: III  TM Distance: >3 FB Neck ROM: full    Dental  (+) Teeth Intact, Poor Dentition, Missing, Chipped, Loose   Pulmonary neg pulmonary ROS, Current SmokerPatient did not abstain from smoking.,    Pulmonary exam normal        Cardiovascular Exercise Tolerance: Good negative cardio ROS Normal cardiovascular exam Rate:Normal     Neuro/Psych  Headaches, negative psych ROS   GI/Hepatic negative GI ROS, Neg liver ROS,   Endo/Other  negative endocrine ROS  Renal/GU negative Renal ROS  negative genitourinary   Musculoskeletal   Abdominal   Peds  Hematology negative hematology ROS (+)   Anesthesia Other Findings   Reproductive/Obstetrics negative OB ROS                             Anesthesia Physical Anesthesia Plan  ASA: 2  Anesthesia Plan: General LMA   Post-op Pain Management:    Induction:   PONV Risk Score and Plan: 3  Airway Management Planned:   Additional Equipment:   Intra-op Plan:   Post-operative Plan:   Informed Consent: I have reviewed the patients History and Physical, chart, labs and discussed the procedure including the risks, benefits and alternatives for the proposed anesthesia with the patient or authorized representative who has indicated his/her understanding and acceptance.       Plan Discussed with: CRNA  Anesthesia Plan Comments:         Anesthesia Quick Evaluation

## 2020-08-31 NOTE — H&P (Signed)
History of Present Illness:  Margaret Gates is a 40 y.o. female who is being referred by Reche Dixon, PA-C, for evaluation and treatment of her bilateral hand and wrist pain and paresthesias, right more symptomatic than left. The patient notes that the symptoms have been present for several years but have worsened over the past few months. She notes that her symptoms are aggravated by holding and grasping objects as well as with driving. She also has pain at night awakening her from sleep. Her symptoms have persisted despite medications, activity modification, using Velcro wrist splints, and taking ibuprofen and/or Tylenol. She notes that the paresthesias extend into her thumb, index, and long fingers of both hands and that her fingers tend to "drop". She has pain radiating up into the volar forearm region. 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 She was sent for EMG studies of both upper extremities which confirmed the presence of bilateral carpal tunnel syndrome, so she has been referred to me for further evaluation and treatment.  Current Outpatient Medications:  acetaminophen (TYLENOL) 325 MG tablet Take 650 mg by mouth every 4 (four) hours as needed for Pain   albuterol 90 mcg/actuation inhaler INHALE 2 PUFFS BY MOUTH EVERY 4 TO 6 HOURS AS NEEDED FOR WHEEZING AND FOR SHORTNESS OF BREATH OR AS DIRECTED   dexAMETHasone (DECADRON) 0.5 MG tablet Take 0.5 tablets (0.25 mg total) by mouth at bedtime 45 tablet 3   docusate sodium (STOOL SOFTENER ORAL) Take by mouth 2 (two) times daily   fludrocortisone (FLORINEF) 0.1 mg tablet Take 1 tablet (0.1 mg total) by mouth once daily 90 tablet 3   hydrocortisone (CORTEF) 10 MG tablet Take 1.5 tablets (15 mg total) by mouth once daily 135 tablet 3   ibuprofen (MOTRIN) 400 MG tablet Take by mouth every 6 (six) hours as needed   magnesium hydroxide (MILK OF MAGNESIA) 400 mg/5 mL  suspension Take by mouth once daily as needed for Constipation   ondansetron (ZOFRAN-ODT) 4 MG disintegrating tablet Take 4 mg by mouth every 8 (eight) hours as needed   polyethylene glycol (MIRALAX) powder Take 17 g by mouth once daily Mix in 4-8ounces of fluid prior to taking.   syringe with needle 3 mL 25 x 5/8" Syrg Use 1 each as directed 1 each 1   Allergies:  Allergen Reactions   Penicillins Unknown (childhood reaction - grandmother told her she was allergic to penicillin.   Tizanidine Nausea   Cyclobenzaprine Nausea   Past Medical History:   Adrenal hyperplasia (CMS-HCC)   Chickenpox   Past Surgical History:   APPENDECTOMY   ARTHROSCOPY HIP Right 03/23/2015  Procedure: ARTHROSCOPY, HIP, SURGICAL; WITH FEMOROPLASTY (IE, TREATMENT OF CAM LESION); Surgeon: Rosalio Macadamia, MD; Location: [place]; Service: Orthopedics; Laterality: Right;   ARTHROSCOPY HIP Right 03/23/2015  Procedure: ARTHROSCOPY, HIP, SURGICAL; WITH LABRAL REPAIR; Surgeon: Rosalio Macadamia, MD; Location: [place]; Service: Orthopedics; Laterality: Right;   ARTHROSCOPY HIP Left 06/20/2015  Procedure: ARTHROSCOPY, HIP, SURGICAL; WITH FEMOROPLASTY (IE, TREATMENT OF CAM LESION); Surgeon: Rosalio Macadamia, MD; Location: [place]; Service: Orthopedics; Laterality: Left;   ARTHROSCOPY HIP Left 06/20/2015  Procedure: ARTHROSCOPY, HIP, SURGICAL; WITH LABRAL REPAIR; Surgeon: Rosalio Macadamia, MD; Location: [place]; Service: Orthopedics; Laterality: Left;   CESAREAN SECTION x 2   ESOPHAGOGASTRODOUDENOSCOPY W/BIOPSY N/A 08/29/2018  Procedure: DIAGNOSTIC ESOPHAGOGASTRODUODENOSCOPY; Surgeon: Jerelene Redden, MD; Location: [facility]; Service:  Gastroenterology; Laterality: N/A;   INTRAOPERATIVE FLUOROSCOPY Right 03/23/2015  Procedure: FLUOROSCOPY (SEPARATE PROCEDURE), UP TO 1 HOUR PHYSICIAN OR OTHER QUALIFIED HEALTH CARE PROFESSIONAL TIME, OTHER THAN [phone] OR [id number] (EG, CARDIAC FLUOROSCOPY); Surgeon: Rosalio Macadamia, MD;  Location: [place]; Service: Orthopedics; Laterality: Right;   INTRAOPERATIVE FLUOROSCOPY Left 06/20/2015  Procedure: FLUOROSCOPY, PHYSICIAN OR OTHER QUALIFIED HEALTH CARE PROFESSIONAL TIME MORE THAN 1 HOUR, ASSISTING A NONRADIOLOGIC PHYSICIAN OR OTHER QUALIFIED HEALTH CARE PROFESSIONAL; Surgeon: Rosalio Macadamia, MD; Location: [place]; Service: Orthopedics; Laterality: Left;   Removal of ovarian cyst with incidental appendectomy   TUBAL LIGATION   UNLISTED ARTHROSCOPY PROCEDURE Left 06/20/2015  Procedure: Complete capsular closure/iliofemoral ligament repair; Surgeon: Rosalio Macadamia, MD; Location: [place]; Service: Orthopedics; Laterality: Left;   Family History:   Scoliosis Mother   Hodgkin's lymphoma Father   ADD / ADHD Brother   Anesthesia problems Neg Hx   Social History:   Socioeconomic History:   Marital status: Married  Tobacco Use   Smoking status: Former Smoker  Packs/day: 0.50  Years: 15.00  Pack years: 7.50  Types: Cigarettes   Smokeless tobacco: Never Used  Scientific laboratory technician Use: Never used  Substance and Sexual Activity   Alcohol use: No  Alcohol/week: 0.0 standard drinks   Drug use: No   Sexual activity: Yes  Partners: Male  Birth control/protection: Surgical, None   Review of Systems:  A comprehensive 14 point ROS was performed, reviewed, and the pertinent orthopaedic findings are documented in the HPI.  Physical Exam: Vitals:  08/19/20 1415  BP: 118/70  Weight: 72.4 kg (159 lb 9.6 oz)  Height: 167.6 cm ('5\' 6"'$ )  PainSc: 6  PainLoc: Wrist   General/Constitutional: The patient appears to be well-nourished, well-developed, and in no acute distress. Neuro/Psych: Normal mood and affect, oriented to person, place and time. Eyes: Non-icteric. Pupils are equal, round, and reactive to light, and exhibit synchronous movement. ENT: Unremarkable. Lymphatic: No palpable adenopathy. Respiratory: Lungs clear to auscultation, Normal chest excursion, No wheezes and  Non-labored breathing Cardiovascular: Regular rate and rhythm. No murmurs. and No edema, swelling or tenderness, except as noted in detailed exam. Integumentary: No impressive skin lesions present, except as noted in detailed exam. Musculoskeletal: Unremarkable, except as noted in detailed exam.  Right wrist/hand exam: Skin inspection of the right wrist and hand is notable for mild swelling over the volar aspect of the wrist and extending into the hand, but otherwise is unremarkable. No erythema, ecchymosis, abrasions, or other skin abnormalities are identified. She exhibits near full active and passive range of motion of the wrist with mild discomfort at the extremes of both flexion and extension. She is able to actively flex and extend all digits without any pain or triggering. She is neurovascularly intact to all digits, other than slightly decreased sensation to light touch subjectively to the tips of the thumb, index, and long fingers. She has a positive Phalen's test as well as a positive Tinel's over the right carpal tunnel.  EMG results: The results of her recent EMG of both upper extremities is available for review. By report, the study demonstrates evidence of mild right and minimal left carpal tunnel syndrome. This report has been reviewed by myself and discussed with the patient.  Assessment:  Carpal tunnel syndrome, right  Plan: The treatment options were discussed with the patient. In addition, patient educational materials were provided regarding the diagnosis and treatment options. Overall, the patient is quite frustrated by her symptoms and function  limitations, and is ready to consider more aggressive treatment options. Therefore, I have recommended a surgical procedure, specifically an endoscopic right carpal tunnel release. The procedure was discussed with the patient, as were the potential risks (including bleeding, infection, nerve and/or blood vessel injury, persistent or  recurrent pain/paresthesias, weakness of grip, need for further surgery, blood clots, strokes, heart attacks and/or arhythmias, pneumonia, etc.) and benefits. The patient states his/her understanding and wishes to proceed. All of the patient's questions and concerns were answered. She can call any time with further concerns. She will follow up post-surgery, routine. She is to remain out of work during this time.   H&P reviewed and patient re-examined. No changes.

## 2020-08-31 NOTE — Discharge Instructions (Addendum)
Orthopedic discharge instructions: Keep dressing dry and intact. Keep hand elevated above heart level. May shower after dressing removed on postop day 4 (Sunday). Cover sutures with Band-Aids after drying off. Apply ice to affected area frequently. Take ibuprofen 600-800 mg TID with meals for 7-10 days, then as necessary. Take ES Tylenol or pain medication as prescribed when needed.  Return for follow-up in 10-14 days or as scheduled.  AMBULATORY SURGERY  DISCHARGE INSTRUCTIONS   The drugs that you were given will stay in your system until tomorrow so for the next 24 hours you should not:  Drive an automobile Make any legal decisions Drink any alcoholic beverage   You may resume regular meals tomorrow.  Today it is better to start with liquids and gradually work up to solid foods.  You may eat anything you prefer, but it is better to start with liquids, then soup and crackers, and gradually work up to solid foods.   Please notify your doctor immediately if you have any unusual bleeding, trouble breathing, redness and pain at the surgery site, drainage, fever, or pain not relieved by medication.    Your post-operative visit with Dr.                                       is: Date:                        Time:    Please call to schedule your post-operative visit. Additional Instructions:

## 2020-08-31 NOTE — Transfer of Care (Signed)
Immediate Anesthesia Transfer of Care Note  Patient: [place]  Procedure(s) Performed: CARPAL TUNNEL RELEASE ENDOSCOPIC (Right: Wrist)  Patient Location: PACU  Anesthesia Type:General  Level of Consciousness: drowsy  Airway & Oxygen Therapy: Patient Spontanous Breathing and Patient connected to face mask oxygen  Post-op Assessment: Report given to RN  Post vital signs: Reviewed  Last Vitals:  Vitals Value Taken Time  BP 122/84 08/31/20 1415  Temp 36.2 C 08/31/20 1400  Pulse 130 08/31/20 1418  Resp 31 08/31/20 1418  SpO2 100 % 08/31/20 1418  Vitals shown include unvalidated device data.  Last Pain:  Vitals:   08/31/20 1115  TempSrc: Oral  PainSc: 4          Complications: No notable events documented.

## 2020-08-31 NOTE — Op Note (Signed)
08/31/2020  1:54 PM  Patient:   Margaret Gates  Pre-Op Diagnosis:   Right carpal tunnel syndrome.  Post-Op Diagnosis:   Same.  Procedure:   Endoscopic right carpal tunnel release.  Surgeon:   Pascal Lux, MD  Anesthesia:   General LMA  Findings:   As above.  Complications:   None  EBL:   0 cc  Fluids:   600 cc crystalloid  TT:   12 minutes at 250 mmHg  Drains:   None  Closure:   4-0 Prolene interrupted sutures  Brief Clinical Note:   The patient is a 40 year old female with a history of progressively worsening pain and paresthesias to her right hand. Her symptoms have persisted despite medications, activity modification, splinting, etc. Her history and examination are consistent with carpal tunnel syndrome, confirmed by EMG. The patient presents at this time for an endoscopic right carpal tunnel release.   Procedure:   The patient was brought into the operating room and lain in the supine position. After adequate general laryngeal mask anesthesia was obtained, the right hand and upper extremity were prepped with ChloraPrep solution before being draped sterilely. Preoperative antibiotics were administered. A timeout was performed to verify the appropriate surgical site before the limb was exsanguinated with an Esmarch and the tourniquet inflated to 250 mmHg. An approximately 1.5-2 cm incision was made over the volar wrist flexion crease, centered over the palmaris longus tendon. The incision was carried down through the subcutaneous tissues with care taken to identify and protect any neurovascular structures. The distal forearm fascia was penetrated just proximal to the transverse carpal ligament. The soft tissues were released off the superficial and deep surfaces of the distal forearm fascia and this was released proximally for 3-4 cm under direct visualization.  Attention was directed distally. The Soil scientist was passed beneath the transverse carpal ligament along  the ulnar aspect of the carpal tunnel and used to release any adhesions as well as to remove any adherent synovial tissue before first the smaller then the larger of the two dilators were passed beneath the transverse carpal ligament along the ulnar margin of the carpal tunnel. The slotted cannula was introduced and the endoscope was placed into the slotted cannula and the undersurface of the transverse carpal ligament visualized. The distal margin of the transverse carpal ligament was marked by placing a 25-gauge needle percutaneously at [place] cardinal point so that it entered the distal portion of the slotted cannula. Under endoscopic visualization, the transverse carpal ligament was released from proximal to distal using the end-cutting blade. A second pass was performed to ensure complete release of the ligament. The adequacy of release was verified both endoscopically and by palpation using the freer elevator.  The wound was irrigated thoroughly with sterile saline solution before being closed using 4-0 Prolene interrupted sutures. A total of 10 cc of 0.5% plain Sensorcaine was injected in and around the incision before a sterile bulky dressing was applied to the wound. The patient was placed into a volar wrist splint before being awakened, extubated, and returned to the recovery room in satisfactory condition after tolerating the procedure well.

## 2020-09-07 ENCOUNTER — Encounter: Payer: Self-pay | Admitting: Surgery

## 2020-10-19 ENCOUNTER — Other Ambulatory Visit: Payer: Self-pay | Admitting: Surgery

## 2020-10-24 ENCOUNTER — Encounter: Payer: Self-pay | Admitting: Urgent Care

## 2020-10-24 ENCOUNTER — Encounter
Admission: RE | Admit: 2020-10-24 | Discharge: 2020-10-24 | Disposition: A | Discharge status: Home / Self Care | Payer: BC Managed Care – PPO | Source: Ambulatory Visit | Attending: Surgery | Admitting: Surgery

## 2020-10-24 ENCOUNTER — Other Ambulatory Visit: Payer: Self-pay

## 2020-10-24 HISTORY — DX: Unspecified asthma, uncomplicated: J45.909

## 2020-10-24 HISTORY — DX: Gastro-esophageal reflux disease without esophagitis: K21.9

## 2020-10-24 NOTE — Patient Instructions (Addendum)
Your procedure is scheduled on: 10/26/20 Report to [facility]. To find out your arrival time please call [phone] between 1PM - 3PM on 10/25/20.  Remember: Instructions that are not followed completely may result in serious medical risk, up to and including death, or upon the discretion of your surgeon and anesthesiologist your surgery may need to be rescheduled.     _X__ 1. Do not eat food after midnight the night before your procedure.                 No gum chewing or hard candies. You may drink clear liquids up to 2 hours                 before you are scheduled to arrive for your surgery- DO not drink clear                 liquids within 2 hours of the start of your surgery.                 Clear Liquids include:  water, apple juice without pulp, clear carbohydrate                 drink such as Clearfast or Gatorade, Black Coffee or Tea (Do not add                 anything to coffee or tea). Diabetics water only  __X__2.  On the morning of surgery brush your teeth with toothpaste and water, you                 may rinse your mouth with mouthwash if you wish.  Do not swallow any              toothpaste of mouthwash.     _X__ 3.  No Alcohol for 24 hours before or after surgery.   _X__ 4.  Do Not Smoke or use e-cigarettes For 24 Hours Prior to Your Surgery.                 Do not use any chewable tobacco products for at least 6 hours prior to                 surgery.  ____  5.  Bring all medications with you on the day of surgery if instructed.   __X__  6.  Notify your doctor if there is any change in your medical condition      (cold, fever, infections).     Do not wear jewelry, make-up, hairpins, clips or nail polish. Do not wear lotions, powders, or perfumes.  Do not shave 48 hours prior to surgery. Men may shave face and neck. Do not bring valuables to the hospital.    [facility] is not responsible for any belongings  or valuables.  Contacts, dentures/partials or body piercings may not be worn into surgery. Bring a case for your contacts, glasses or hearing aids, a denture cup will be supplied. Leave your suitcase in the car. After surgery it may be brought to your room. For patients admitted to the hospital, discharge time is determined by your treatment team.   Patients discharged the day of surgery will not be allowed to drive home.   Please read over the following fact sheets that you were given:     __X__ Take these medicines the morning of surgery with A SIP OF WATER:  1. dexamethasone (DECADRON) 0.5 MG tablet  2. fludrocortisone (FLORINEF) 0.1 MG tablet  3. hydrocortisone (CORTEF) 10 MG tablet  4. omeprazole (PRILOSEC OTC) 20 MG tablet  5.  6.  ____ Fleet Enema (as directed)   ____ Use CHG Soap/SAGE wipes as directed  ____ Use inhalers on the day of surgery  ____ Stop metformin/Janumet/Farxiga 2 days prior to surgery    ____ Take 1/2 of usual insulin dose the night before surgery. No insulin the morning          of surgery.   ____ Stop Blood Thinners Coumadin/Plavix/Xarelto/Pleta/Pradaxa/Eliquis/Effient/Aspirin  on   Or contact your Surgeon, Cardiologist or Medical Doctor regarding  ability to stop your blood thinners  __X__ Stop Anti-inflammatories 7 days before surgery such as Advil, Ibuprofen, Motrin,  BC or Goodies Powder, Naprosyn, Naproxen, Aleve, Aspirin    __X__ Stop all herbal supplements, fish oil or vitamin E until after surgery.    ____ Bring C-Pap to the hospital.      How to Use an Incentive Spirometer An incentive spirometer is a tool that measures how well you are filling your lungs with each breath. Learning to take long, deep breaths using this tool can help you keep your lungs clear and active. This may help to reverse or lessen your chance of developing breathing (pulmonary) problems, especially infection. You may be asked to use a spirometer: After a  surgery. If you have a lung problem or a history of smoking. After a long period of time when you have been unable to move or be active. If the spirometer includes an indicator to show the highest number that you have reached, your health care provider or respiratory therapist will help you set a goal. Keep a log of your progress as told by your health care provider. What are the risks? Breathing too quickly may cause dizziness or cause you to pass out. Take your time so you do not get dizzy or light-headed. If you are in pain, you may need to take pain medicine before doing incentive spirometry. It is harder to take a deep breath if you are having pain. How to use your incentive spirometer  Sit up on the edge of your bed or on a chair. Hold the incentive spirometer so that it is in an upright position. Before you use the spirometer, breathe out normally. Place the mouthpiece in your mouth. Make sure your lips are closed tightly around it. Breathe in slowly and as deeply as you can through your mouth, causing the piston or the ball to rise toward the top of the chamber. Hold your breath for 3-5 seconds, or for as long as possible. If the spirometer includes a coach indicator, use this to guide you in breathing. Slow down your breathing if the indicator goes above the marked areas. Remove the mouthpiece from your mouth and breathe out normally. The piston or ball will return to the bottom of the chamber. Rest for a few seconds, then repeat the steps 10 or more times. Take your time and take a few normal breaths between deep breaths so that you do not get dizzy or light-headed. Do this every 1-2 hours when you are awake. If the spirometer includes a goal marker to show the highest number you have reached (best effort), use this as a goal to work toward during each repetition. After each set of 10 deep breaths, cough a few times. This will help to make sure that your lungs are clear.  If you have  an incision on your chest or abdomen from surgery, place a pillow or a rolled-up towel firmly against the incision when you cough. This can help to reduce pain while taking deep breaths and coughing. General tips When you are able to get out of bed: Walk around often. Continue to take deep breaths and cough in order to clear your lungs. Keep using the incentive spirometer until your health care provider says it is okay to stop using it. If you have been in the hospital, you may be told to keep using the spirometer at home. Contact a health care provider if: You are having difficulty using the spirometer. You have trouble using the spirometer as often as instructed. Your pain medicine is not giving enough relief for you to use the spirometer as told. You have a fever. Get help right away if: You develop shortness of breath. You develop a cough with bloody mucus from the lungs. You have fluid or blood coming from an incision site after you cough. Summary An incentive spirometer is a tool that can help you learn to take long, deep breaths to keep your lungs clear and active. You may be asked to use a spirometer after a surgery, if you have a lung problem or a history of smoking, or if you have been inactive for a long period of time. Use your incentive spirometer as instructed every 1-2 hours while you are awake. If you have an incision on your chest or abdomen, place a pillow or a rolled-up towel firmly against your incision when you cough. This will help to reduce pain. Get help right away if you have shortness of breath, you cough up bloody mucus, or blood comes from your incision when you cough. This information is not intended to replace advice given to you by your health care provider. Make sure you discuss any questions you have with your health care provider. Document Revised: 03/09/2019 Document Reviewed: 03/09/2019 Elsevier Patient Education  [place].

## 2020-10-24 NOTE — Progress Notes (Signed)
  Perioperative Services Pre-Admission/Anesthesia Testing     Date: 10/24/20  Name: Margaret Gates MRN:   [id number]  Re: Change in Speed for upcoming surgery   Case: [id number] Date/Time: 10/26/20 1405   Procedure: CARPAL TUNNEL RELEASE ENDOSCOPIC (Left: Wrist)   Anesthesia type: Choice   Pre-op diagnosis: CARPAL TUNNEL SYNDROME, LEFT   Location: [facility] OR ROOM 02 / [place] [facility]   Surgeons: Corky Mull, MD     Primary attending surgeon was consulted regarding consideration of therapeutic change in antimicrobial agent being used for preoperative prophylaxis in this patient's upcoming surgical case. Following analysis of the risk versus benefits, the patient's primary attending surgeon advised that it would be acceptable to discontinue the ordered clindamycin and place an order for cefazolin 2 gm IV on call to the OR. Orders for this patient were amended by me following collaborative conversation with attending surgeon taking into consideration of risk versus benefits associated with the change in therapy.  Honor Loh, MSN, APRN, FNP-C, CEN [facility]  Peri-operative Services Nurse Practitioner Phone: [phone] 10/24/20 11:25 AM

## 2020-10-26 ENCOUNTER — Ambulatory Visit: Payer: BC Managed Care – PPO | Admitting: Urgent Care

## 2020-10-26 ENCOUNTER — Ambulatory Visit
Admission: RE | Admit: 2020-10-26 | Discharge: 2020-10-26 | Disposition: A | Discharge status: Home / Self Care | Payer: BC Managed Care – PPO | Attending: Surgery | Admitting: Surgery

## 2020-10-26 ENCOUNTER — Encounter
Admission: RE | Disposition: A | Discharge status: Home / Self Care | Payer: Self-pay | Source: Home / Self Care | Attending: Surgery

## 2020-10-26 ENCOUNTER — Encounter: Payer: Self-pay | Admitting: Surgery

## 2020-10-26 ENCOUNTER — Other Ambulatory Visit: Payer: Self-pay

## 2020-10-26 DIAGNOSIS — Z7952 Long term (current) use of systemic steroids: Secondary | ICD-10-CM | POA: Diagnosis not present

## 2020-10-26 DIAGNOSIS — Z79899 Other long term (current) drug therapy: Secondary | ICD-10-CM | POA: Diagnosis not present

## 2020-10-26 DIAGNOSIS — G5602 Carpal tunnel syndrome, left upper limb: Secondary | ICD-10-CM | POA: Insufficient documentation

## 2020-10-26 DIAGNOSIS — Z87891 Personal history of nicotine dependence: Secondary | ICD-10-CM | POA: Diagnosis not present

## 2020-10-26 DIAGNOSIS — Z888 Allergy status to other drugs, medicaments and biological substances status: Secondary | ICD-10-CM | POA: Insufficient documentation

## 2020-10-26 DIAGNOSIS — Z880 Allergy status to penicillin: Secondary | ICD-10-CM | POA: Insufficient documentation

## 2020-10-26 HISTORY — PX: CARPAL TUNNEL RELEASE: SHX101

## 2020-10-26 LAB — POCT PREGNANCY, URINE: Preg Test, Ur: NEGATIVE

## 2020-10-26 SURGERY — RELEASE, CARPAL TUNNEL, ENDOSCOPIC
Anesthesia: General | Site: Wrist | Laterality: Left

## 2020-10-26 MED ORDER — METOCLOPRAMIDE HCL 10 MG PO TABS: 5.0000 mg | ORAL_TABLET | Freq: Three times a day (TID) | ORAL | Status: DC | PRN

## 2020-10-26 MED ORDER — FENTANYL CITRATE (PF) 100 MCG/2ML IJ SOLN
INTRAMUSCULAR | Status: AC
  Filled 2020-10-26: qty 2

## 2020-10-26 MED ORDER — HYDROMORPHONE HCL 1 MG/ML IJ SOLN
INTRAMUSCULAR | Status: DC | PRN
  Administered 2020-10-26: 1 mg via INTRAVENOUS

## 2020-10-26 MED ORDER — OXYCODONE HCL 5 MG PO TABS: 5.0000 mg | ORAL_TABLET | ORAL | 0 refills | Status: DC | PRN

## 2020-10-26 MED ORDER — ALBUTEROL SULFATE HFA 108 (90 BASE) MCG/ACT IN AERS
INHALATION_SPRAY | RESPIRATORY_TRACT | Status: DC | PRN
  Administered 2020-10-26: 2 via RESPIRATORY_TRACT
  Administered 2020-10-26: 4 via RESPIRATORY_TRACT

## 2020-10-26 MED ORDER — MIDAZOLAM HCL 2 MG/2ML IJ SOLN
INTRAMUSCULAR | Status: AC
  Filled 2020-10-26: qty 2

## 2020-10-26 MED ORDER — CHLORHEXIDINE GLUCONATE 0.12 % MT SOLN: 15.0000 mL | Freq: Once | OROMUCOSAL | Status: AC

## 2020-10-26 MED ORDER — ONDANSETRON HCL 4 MG PO TABS: 4.0000 mg | ORAL_TABLET | Freq: Four times a day (QID) | ORAL | Status: DC | PRN

## 2020-10-26 MED ORDER — 0.9 % SODIUM CHLORIDE (POUR BTL) OPTIME
TOPICAL | Status: DC | PRN
  Administered 2020-10-26: 500 mL

## 2020-10-26 MED ORDER — ONDANSETRON HCL 4 MG/2ML IJ SOLN: 4.0000 mg | Freq: Four times a day (QID) | INTRAMUSCULAR | Status: DC | PRN

## 2020-10-26 MED ORDER — FENTANYL CITRATE (PF) 100 MCG/2ML IJ SOLN: 25.0000 ug | INTRAMUSCULAR | Status: DC | PRN

## 2020-10-26 MED ORDER — PROPOFOL 10 MG/ML IV BOLUS
INTRAVENOUS | Status: DC | PRN
  Administered 2020-10-26: 150 mg via INTRAVENOUS
  Administered 2020-10-26 (×5): 50 mg via INTRAVENOUS

## 2020-10-26 MED ORDER — HYDROCORTISONE SOD SUC (PF) 100 MG IJ SOLR
INTRAMUSCULAR | Status: DC | PRN
  Administered 2020-10-26: 125 mg via INTRAVENOUS

## 2020-10-26 MED ORDER — OXYCODONE HCL 5 MG/5ML PO SOLN: 5.0000 mg | Freq: Once | ORAL | Status: DC | PRN

## 2020-10-26 MED ORDER — FENTANYL CITRATE (PF) 100 MCG/2ML IJ SOLN
INTRAMUSCULAR | Status: DC | PRN
  Administered 2020-10-26: 50 ug via INTRAVENOUS
  Administered 2020-10-26 (×2): 25 ug via INTRAVENOUS

## 2020-10-26 MED ORDER — ACETAMINOPHEN 10 MG/ML IV SOLN: 1000.0000 mg | Freq: Once | INTRAVENOUS | Status: DC | PRN

## 2020-10-26 MED ORDER — PROPOFOL 10 MG/ML IV BOLUS
INTRAVENOUS | Status: AC
  Filled 2020-10-26: qty 20

## 2020-10-26 MED ORDER — ONDANSETRON HCL 4 MG/2ML IJ SOLN
INTRAMUSCULAR | Status: AC
  Filled 2020-10-26: qty 2

## 2020-10-26 MED ORDER — ONDANSETRON HCL 4 MG/2ML IJ SOLN
INTRAMUSCULAR | Status: DC | PRN
Start: 2020-10-26 — End: 2020-10-26
  Administered 2020-10-26: 4 mg via INTRAVENOUS

## 2020-10-26 MED ORDER — OXYCODONE HCL 5 MG PO TABS: 5.0000 mg | ORAL_TABLET | Freq: Once | ORAL | Status: DC | PRN

## 2020-10-26 MED ORDER — IPRATROPIUM-ALBUTEROL 0.5-2.5 (3) MG/3ML IN SOLN
RESPIRATORY_TRACT | Status: AC
  Administered 2020-10-26: 3 mL via RESPIRATORY_TRACT
  Filled 2020-10-26: qty 3

## 2020-10-26 MED ORDER — PHENYLEPHRINE HCL (PRESSORS) 10 MG/ML IV SOLN
INTRAVENOUS | Status: DC | PRN
  Administered 2020-10-26: 100 ug via INTRAVENOUS

## 2020-10-26 MED ORDER — IPRATROPIUM-ALBUTEROL 0.5-2.5 (3) MG/3ML IN SOLN: 3.0000 mL | Freq: Once | RESPIRATORY_TRACT | Status: AC

## 2020-10-26 MED ORDER — LIDOCAINE HCL (PF) 2 % IJ SOLN
INTRAMUSCULAR | Status: AC
  Filled 2020-10-26: qty 5

## 2020-10-26 MED ORDER — LIDOCAINE HCL (CARDIAC) PF 100 MG/5ML IV SOSY
PREFILLED_SYRINGE | INTRAVENOUS | Status: DC | PRN
  Administered 2020-10-26: 60 mg via INTRAVENOUS

## 2020-10-26 MED ORDER — LACTATED RINGERS IV SOLN
INTRAVENOUS | Status: DC
Start: 2020-10-26 — End: 2020-10-26

## 2020-10-26 MED ORDER — HYDROMORPHONE HCL 1 MG/ML IJ SOLN
INTRAMUSCULAR | Status: AC
  Filled 2020-10-26: qty 1

## 2020-10-26 MED ORDER — DEXAMETHASONE SODIUM PHOSPHATE 10 MG/ML IJ SOLN
INTRAMUSCULAR | Status: AC
  Filled 2020-10-26: qty 1

## 2020-10-26 MED ORDER — DEXMEDETOMIDINE (PRECEDEX) IN NS 20 MCG/5ML (4 MCG/ML) IV SYRINGE
PREFILLED_SYRINGE | INTRAVENOUS | Status: DC | PRN
  Administered 2020-10-26: 8 ug via INTRAVENOUS
  Administered 2020-10-26: 4 ug via INTRAVENOUS

## 2020-10-26 MED ORDER — ALBUTEROL SULFATE HFA 108 (90 BASE) MCG/ACT IN AERS
INHALATION_SPRAY | RESPIRATORY_TRACT | Status: AC
  Filled 2020-10-26: qty 6.7

## 2020-10-26 MED ORDER — CHLORHEXIDINE GLUCONATE 0.12 % MT SOLN
OROMUCOSAL | Status: AC
  Administered 2020-10-26: 15 mL via OROMUCOSAL
  Filled 2020-10-26: qty 15

## 2020-10-26 MED ORDER — BUPIVACAINE HCL (PF) 0.5 % IJ SOLN
INTRAMUSCULAR | Status: AC
  Filled 2020-10-26: qty 30

## 2020-10-26 MED ORDER — SODIUM CHLORIDE 0.9 % IV SOLN: INTRAVENOUS | Status: DC

## 2020-10-26 MED ORDER — GLYCOPYRROLATE 0.2 MG/ML IJ SOLN
INTRAMUSCULAR | Status: DC | PRN
Start: 2020-10-26 — End: 2020-10-26
  Administered 2020-10-26: .2 mg via INTRAVENOUS

## 2020-10-26 MED ORDER — BUPIVACAINE HCL (PF) 0.5 % IJ SOLN
INTRAMUSCULAR | Status: DC | PRN
  Administered 2020-10-26: 10 mL

## 2020-10-26 MED ORDER — ORAL CARE MOUTH RINSE: 15.0000 mL | Freq: Once | OROMUCOSAL | Status: AC

## 2020-10-26 MED ORDER — METOCLOPRAMIDE HCL 5 MG/ML IJ SOLN: 5.0000 mg | Freq: Three times a day (TID) | INTRAMUSCULAR | Status: DC | PRN

## 2020-10-26 MED ORDER — CEFAZOLIN SODIUM-DEXTROSE 2-4 GM/100ML-% IV SOLN
2.0000 g | Freq: Once | INTRAVENOUS | Status: AC
  Administered 2020-10-26: 2 g via INTRAVENOUS

## 2020-10-26 MED ORDER — ONDANSETRON HCL 4 MG/2ML IJ SOLN: 4.0000 mg | Freq: Once | INTRAMUSCULAR | Status: DC | PRN

## 2020-10-26 MED ORDER — MIDAZOLAM HCL 2 MG/2ML IJ SOLN
INTRAMUSCULAR | Status: DC | PRN
  Administered 2020-10-26: 2 mg via INTRAVENOUS

## 2020-10-26 MED ORDER — CEFAZOLIN SODIUM-DEXTROSE 2-4 GM/100ML-% IV SOLN
INTRAVENOUS | Status: AC
  Filled 2020-10-26: qty 100

## 2020-10-26 MED ORDER — HYDROCODONE-ACETAMINOPHEN 5-325 MG PO TABS: 1.0000 | ORAL_TABLET | ORAL | Status: DC | PRN

## 2020-10-26 SURGICAL SUPPLY — 33 items
APL PRP STRL LF DISP 70% ISPRP (MISCELLANEOUS) ×1
BNDG COHESIVE 4X5 TAN ST LF (GAUZE/BANDAGES/DRESSINGS) ×2 IMPLANT
BNDG ELASTIC 2X5.8 VLCR STR LF (GAUZE/BANDAGES/DRESSINGS) ×2 IMPLANT
BNDG ESMARK 4X12 TAN STRL LF (GAUZE/BANDAGES/DRESSINGS) ×2 IMPLANT
CHLORAPREP W/TINT 26 (MISCELLANEOUS) ×2 IMPLANT
CORD BIP STRL DISP 12FT (MISCELLANEOUS) ×2 IMPLANT
CUFF TOURN SGL QUICK 18X4 (TOURNIQUET CUFF) IMPLANT
DRAPE SURG 17X11 SM STRL (DRAPES) ×2 IMPLANT
FORCEPS JEWEL BIP 4-3/4 STR (INSTRUMENTS) ×2 IMPLANT
GAUZE 4X4 16PLY ~~LOC~~+RFID DBL (SPONGE) ×2 IMPLANT
GAUZE SPONGE 4X4 12PLY STRL (GAUZE/BANDAGES/DRESSINGS) ×2 IMPLANT
GAUZE XEROFORM 1X8 LF (GAUZE/BANDAGES/DRESSINGS) ×2 IMPLANT
GLOVE SURG ENC MOIS LTX SZ8 (GLOVE) ×2 IMPLANT
GLOVE SURG UNDER LTX SZ8 (GLOVE) ×2 IMPLANT
GOWN STRL REUS W/ TWL LRG LVL3 (GOWN DISPOSABLE) ×1 IMPLANT
GOWN STRL REUS W/ TWL XL LVL3 (GOWN DISPOSABLE) ×1 IMPLANT
GOWN STRL REUS W/TWL LRG LVL3 (GOWN DISPOSABLE) ×2
GOWN STRL REUS W/TWL XL LVL3 (GOWN DISPOSABLE) ×2
KIT CARPAL TUNNEL (MISCELLANEOUS) ×2
KIT ESCP INSRT D SLOT CANN KN (MISCELLANEOUS) ×1 IMPLANT
KIT TURNOVER KIT A (KITS) ×2 IMPLANT
MANIFOLD NEPTUNE II (INSTRUMENTS) ×2 IMPLANT
NS IRRIG 500ML POUR BTL (IV SOLUTION) ×2 IMPLANT
PACK EXTREMITY ARMC (MISCELLANEOUS) ×2 IMPLANT
SPLINT WRIST LG LT TX990309 (SOFTGOODS) IMPLANT
SPLINT WRIST LG RT TX900304 (SOFTGOODS) IMPLANT
SPLINT WRIST M LT TX990308 (SOFTGOODS) ×2 IMPLANT
SPLINT WRIST M RT TX990303 (SOFTGOODS) IMPLANT
SPLINT WRIST XL LT TX990310 (SOFTGOODS) IMPLANT
SPLINT WRIST XL RT TX990305 (SOFTGOODS) IMPLANT
STOCKINETTE IMPERVIOUS 9X36 MD (GAUZE/BANDAGES/DRESSINGS) ×2 IMPLANT
SUT PROLENE 4 0 PS 2 18 (SUTURE) ×2 IMPLANT
WATER STERILE IRR 500ML POUR (IV SOLUTION) ×2 IMPLANT

## 2020-10-26 NOTE — Op Note (Signed)
10/26/2020  1:19 PM  Patient:   Margaret Gates  Pre-Op Diagnosis:   Left carpal tunnel syndrome.  Post-Op Diagnosis:   Same.  Procedure:   Endoscopic left carpal tunnel release.  Surgeon:   Pascal Lux, MD  Anesthesia:   General LMA  Findings:   As above.  Complications:   None  EBL:   0 cc  Fluids:   500 cc crystalloid  TT:   12 minutes at 250 mmHg  Drains:   None  Closure:   4-0 Prolene interrupted sutures  Brief Clinical Note:   The patient is a 40 year old female with a long history progressively worsening pain and paresthesias to her left hand. Her symptoms have progressed despite medications, activity modification, splinting, etc. Her history and examination are consistent with carpal tunnel syndrome. The patient presents at this time for an endoscopic left carpal tunnel release.   Procedure:   The patient was brought into the operating room and lain in the supine position. After adequate general laryngeal mask anesthesia was obtained, the left hand and upper extremity were prepped with ChloraPrep solution before being draped sterilely. Preoperative antibiotics were administered. A timeout was performed to verify the appropriate surgical site before the limb was exsanguinated with an Esmarch and the tourniquet inflated to 250 mmHg. An approximately 1.5-2 cm incision was made over the volar wrist flexion crease, centered over the palmaris longus tendon. The incision was carried down through the subcutaneous tissues with care taken to identify and protect any neurovascular structures. The distal forearm fascia was penetrated just proximal to the transverse carpal ligament. The soft tissues were released off the superficial and deep surfaces of the distal forearm fascia and this was released proximally for 3-4 cm under direct visualization.  Attention was directed distally. The Soil scientist was passed beneath the transverse carpal ligament along the ulnar aspect of  the carpal tunnel and used to release any adhesions as well as to remove any adherent synovial tissue before first the smaller then the larger of the two dilators were passed beneath the transverse carpal ligament along the ulnar margin of the carpal tunnel. The slotted cannula was introduced and the endoscope was placed into the slotted cannula and the undersurface of the transverse carpal ligament visualized. The distal margin of the transverse carpal ligament was marked by placing a 25-gauge needle percutaneously at [place] cardinal point so that it entered the distal portion of the slotted cannula. Under endoscopic visualization, the transverse carpal ligament was released from proximal to distal using the end-cutting blade. A second pass was performed to ensure complete release of the ligament. The adequacy of release was verified both endoscopically and by palpation using the freer elevator.  The wound was irrigated thoroughly with sterile saline solution before being closed using 4-0 Prolene interrupted sutures. A total of 10 cc of 0.5% plain Sensorcaine was injected in and around the incision before a sterile bulky dressing was applied to the wound. The patient was placed into a volar wrist splint before being awakened, extubated, and returned to the recovery room in satisfactory condition after tolerating the procedure well.

## 2020-10-26 NOTE — H&P (Signed)
HPI:  Margaret Gates is a 40 y.o. female who presents for evaluation and treatment of continued pain and paresthesias in her left hand. The symptoms are aggravated by repetitive activities, as well as at night. She has been taking Tylenol and using a splint at night with limited benefit. She would like to consider more aggressive treatment options for her left wrist at this time. She remains out of work following her right wrist surgery and would like to get her left wrist finished before going back to work. She denies any recent injury to the left wrist.  Current Outpatient Medications:  acetaminophen (TYLENOL) 325 MG tablet Take 650 mg by mouth every 4 (four) hours as needed for Pain   albuterol 90 mcg/actuation inhaler INHALE 2 PUFFS BY MOUTH EVERY 4 TO 6 HOURS AS NEEDED FOR WHEEZING AND FOR SHORTNESS OF BREATH OR AS DIRECTED   dexAMETHasone (DECADRON) 0.5 MG tablet Take 0.5 tablets (0.25 mg total) by mouth at bedtime 45 tablet 3   docusate sodium (STOOL SOFTENER ORAL) Take by mouth 2 (two) times daily   fludrocortisone (FLORINEF) 0.1 mg tablet Take 1 tablet (0.1 mg total) by mouth once daily 90 tablet 3   hydrocortisone (CORTEF) 10 MG tablet Take 1.5 tablets (15 mg total) by mouth once daily 135 tablet 3   ibuprofen (MOTRIN) 400 MG tablet Take by mouth every 6 (six) hours as needed   syringe with needle 3 mL 25 x 5/8" Syrg Use 1 each as directed 1 each 1   Allergies:   Penicillins Unknown (childhood reaction - Pt states that her grandmother told her she was allergic to penicillin.   Tizanidine Nausea   Cyclobenzaprine Nausea   Past Medical History:   Adrenal hyperplasia (CMS-HCC)   Chickenpox   Past Surgical History:   Endoscopic right carpal tunnel release Right 08/31/2020 (Dr. Roland Rack)   APPENDECTOMY   ARTHROSCOPY HIP Right 03/23/2015  Procedure: ARTHROSCOPY, HIP, SURGICAL; WITH FEMOROPLASTY AND LABRAL REPAIR (IE, TREATMENT OF CAM LESION); Surgeon: Rosalio Macadamia, MD; Location: [place]; Service: Orthopedics; Laterality: Right;   ARTHROSCOPY HIP Left 06/20/2015  Procedure: ARTHROSCOPY, HIP, SURGICAL; WITH FEMOROPLASTY AND LABRAL REPAIR (IE, TREATMENT OF CAM LESION); Surgeon: Rosalio Macadamia, MD; Location: [place]; Service: Orthopedics; Laterality: Left;   CESAREAN SECTION x 2   ESOPHAGOGASTRODOUDENOSCOPY W/BIOPSY N/A 08/29/2018  Procedure: DIAGNOSTIC ESOPHAGOGASTRODUODENOSCOPY; Surgeon: Jerelene Redden, MD; Location: [facility]; Service: Gastroenterology; Laterality: N/A;   Removal of ovarian cyst with incidental appendectomy   TUBAL LIGATION   UNLISTED ARTHROSCOPY PROCEDURE Left 06/20/2015  Procedure: Complete capsular closure/iliofemoral ligament repair; Surgeon: Rosalio Macadamia, MD; Location: [place]; Service: Orthopedics; Laterality: Left;   Family History:   Scoliosis Mother   Hodgkin's lymphoma Father   ADD/ADHD Brother   Anesthesia problems Neg Hx   Social History:   Socioeconomic History:   Marital status: Married  Tobacco Use   Smoking status: Former Smoker  Packs/day: 0.50  Years: 15.00  Pack years: 7.50  Types: Cigarettes   Smokeless tobacco: Never Used  Scientific laboratory technician Use: Never used  Substance and Sexual Activity   Alcohol use: No  Alcohol/week: 0.0 standard drinks   Drug use: No   Sexual activity: Yes  Partners: Male  Birth control/protection: Surgical, None   Review of Systems:  A comprehensive 14 point ROS was performed, reviewed, and the pertinent orthopaedic findings are documented in the HPI.  Physical Exam: Vitals:  10/14/20 0926  BP: 120/82  Weight: 72.9  kg (160 lb 12.8 oz)  Height: 167.6 cm (5\' 6" )  PainSc: 2  PainLoc: Hand   General/Constitutional: The patient appears to be well-nourished, well-developed, and in no acute distress. Neuro/Psych: Normal mood and affect, oriented to person, place and time.  Eyes: Non-icteric. Pupils are equal, round, and reactive to light, and exhibit synchronous  movement. ENT: Unremarkable. Lymphatic: No palpable adenopathy. Respiratory: Lungs clear to auscultation, Normal chest excursion, No wheezes and Non-labored breathing Cardiovascular: Regular rate and rhythm. No murmurs. and No edema, swelling or tenderness, except as noted in detailed exam. Integumentary: No impressive skin lesions present, except as noted in detailed exam. Musculoskeletal: Unremarkable, except as noted in detailed exam.  Left wrist/hand exam: Skin inspection of the left wrist and hand is unremarkable. No swelling, erythema, ecchymosis, abrasions, or other skin abnormalities are identified. She demonstrates near full active and passive range of motion of the wrist with minimal discomfort at the extremes of both flexion and extension. She is able to actively flex and extend all digits without any pain or triggering. She remains neurovascularly intact to all digits, other than slightly decreased sensation to light touch subjectively to the tips of the thumb, index, and long fingers. She has a negative Phalen's test, but a positive Tinel's over the left carpal tunnel.  Assessment:  Carpal tunnel syndrome, left   Plan: The treatment options were discussed with the patient. Regarding her left wrist symptoms, the patient is frustrated by her persistent symptoms and functional limitations, and is ready to consider more aggressive treatment options. Therefore, I have recommended a surgical procedure, specifically an endoscopic left carpal tunnel release. The procedure was discussed with the patient, as were the potential risks (including bleeding, infection, nerve and/or blood vessel injury, persistent or recurrent pain/paresthesias, weakness of grip, need for further surgery, blood clots, strokes, heart attacks and/or arhythmias, pneumonia, etc.) and benefits. The patient states his/her understanding and wishes to proceed. All of the patient's questions and concerns were answered. She can  call any time with further concerns. She will follow up post-surgery, routine. She will remain out of work until she recuperates from her left wrist surgery.   H&P reviewed and patient re-examined. No changes.

## 2020-10-26 NOTE — Anesthesia Preprocedure Evaluation (Addendum)
Anesthesia Evaluation  General Assessment Comment:  In PACU at previous carpal tunnel anesthetic encounter, it is charted that patient had a laryngospasm requiring positive pressure and succinylcholine administration.  Airway Mallampati: II  TM Distance: >3 FB Neck ROM: Full    Dental  (+) Poor Dentition, Chipped, Missing   Pulmonary asthma , Current Smoker and Patient abstained from smoking.,  Never hospitalized for asthma. Takes inhalers every few months.    Pulmonary exam normal breath sounds clear to auscultation       Cardiovascular  Rhythm:Regular Rate:Normal - Systolic murmurs    Neuro/Psych    GI/Hepatic GERD  Medicated,  Endo/Other    Renal/GU      Musculoskeletal   Abdominal   Peds  Hematology   Anesthesia Other Findings   Reproductive/Obstetrics                             Anesthesia Physical Anesthesia Plan  ASA: 2  Anesthesia Plan: General   Post-op Pain Management:    Induction: Intravenous  PONV Risk Score and Plan: 2 and Ondansetron, Dexamethasone and Midazolam  Airway Management Planned: LMA  Additional Equipment: None  Intra-op Plan:   Post-operative Plan: Extubation in OR  Informed Consent: I have reviewed the patients History and Physical, chart, labs and discussed the procedure including the risks, benefits and alternatives for the proposed anesthesia with the patient or authorized representative who has indicated his/her understanding and acceptance.     Dental advisory given  Plan Discussed with: CRNA and Surgeon  Anesthesia Plan Comments: (Discussed risks of anesthesia with patient, including PONV, sore throat, lip/dental damage. Rare risks discussed as well, such as cardiorespiratory and neurological sequelae, and allergic reactions. Patient understands. Patient counseled on benefits of smoking cessation, and increased perioperative risks associated  with continued smoking. )       Anesthesia Quick Evaluation

## 2020-10-26 NOTE — Anesthesia Postprocedure Evaluation (Signed)
Anesthesia Post Note  Patient: [place]  Procedure(s) Performed: CARPAL TUNNEL RELEASE ENDOSCOPIC (Left: Wrist)  Patient location during evaluation: PACU Anesthesia Type: General Level of consciousness: awake and alert Pain management: pain level controlled Vital Signs Assessment: post-procedure vital signs reviewed and stable Respiratory status: respiratory function stable Cardiovascular status: blood pressure returned to baseline and stable Postop Assessment: no apparent nausea or vomiting Anesthetic complications: no   No notable events documented.   Last Vitals:  Vitals:   10/26/20 1400 10/26/20 1409  BP: 104/77 110/74  Pulse: (!) 102 (!) 102  Resp: 13 18  Temp: (!) 36.2 C   SpO2: 95% 100%    Last Pain:  Vitals:   10/26/20 1409  TempSrc:   PainSc: 0-No pain                 Arita Miss

## 2020-10-26 NOTE — Transfer of Care (Signed)
Immediate Anesthesia Transfer of Care Note  Patient: [place]  Procedure(s) Performed: CARPAL TUNNEL RELEASE ENDOSCOPIC (Left: Wrist)  Patient Location: PACU   Anesthesia Type:General  Level of Consciousness: drowsy  Airway & Oxygen Therapy: Patient Spontanous Breathing and Patient connected to face mask  Post-op Assessment: Report given to RN, VSS  Post vital signs: Reviewed and stable  Last Vitals:  Vitals Value Taken Time  BP 100/61 10/26/20 1330  Temp    Pulse 95 10/26/20 1332  Resp 14 10/26/20 1332  SpO2 95 % 10/26/20 1332  Vitals shown include unvalidated device data.  Last Pain:  Vitals:   10/26/20 1221  TempSrc: Temporal  PainSc: 0-No pain         Complications: No notable events documented.

## 2020-10-26 NOTE — Discharge Instructions (Addendum)
Orthopedic discharge instructions: Keep dressing dry and intact. Keep hand elevated above heart level. May shower after dressing removed on postop day 4 (Sunday). Cover sutures with Band-Aids after drying off. Apply ice to affected area frequently. Take ibuprofen 600-800 mg TID with meals for 7-10 days, then as necessary. Take ES Tylenol or pain medication as prescribed when needed.  Return for follow-up in 10-14 days or as scheduled.  AMBULATORY SURGERY  DISCHARGE INSTRUCTIONS   The drugs that you were given will stay in your system until tomorrow so for the next 24 hours you should not:  Drive an automobile Make any legal decisions Drink any alcoholic beverage   You may resume regular meals tomorrow.  Today it is better to start with liquids and gradually work up to solid foods.  You may eat anything you prefer, but it is better to start with liquids, then soup and crackers, and gradually work up to solid foods.   Please notify your doctor immediately if you have any unusual bleeding, trouble breathing, redness and pain at the surgery site, drainage, fever, or pain not relieved by medication.      Please contact your physician with any problems or Same Day Surgery at [phone], Monday through Friday 6 am to 4 pm, or [place] [facility] number at [phone].

## 2020-10-26 NOTE — Anesthesia Procedure Notes (Signed)
Procedure Name: LMA Insertion Date/Time: 10/26/2020 12:40 PM Performed by: Demetrius Charity, CRNA Pre-anesthesia Checklist: Patient identified, Patient being monitored, Timeout performed, Emergency Drugs available and Suction available Patient Re-evaluated:Patient Re-evaluated prior to induction Oxygen Delivery Method: Circle system utilized Preoxygenation: Pre-oxygenation with 100% oxygen Induction Type: IV induction Ventilation: Mask ventilation without difficulty LMA: LMA inserted LMA Size: 3.0 Tube type: Oral Number of attempts: 1 Placement Confirmation: positive ETCO2 and breath sounds checked- equal and bilateral Tube secured with: Tape Dental Injury: Teeth and Oropharynx as per pre-operative assessment

## 2021-01-26 ENCOUNTER — Other Ambulatory Visit: Payer: Self-pay

## 2021-01-26 ENCOUNTER — Inpatient Hospital Stay
Admission: EM | Admit: 2021-01-26 | Discharge: 2021-01-28 | DRG: 871 | Disposition: A | Discharge status: Home / Self Care | Payer: BC Managed Care – PPO | Attending: Internal Medicine | Admitting: Internal Medicine

## 2021-01-26 ENCOUNTER — Emergency Department: Payer: BC Managed Care – PPO

## 2021-01-26 ENCOUNTER — Encounter: Payer: Self-pay | Admitting: Emergency Medicine

## 2021-01-26 ENCOUNTER — Inpatient Hospital Stay: Payer: BC Managed Care – PPO

## 2021-01-26 DIAGNOSIS — Z20822 Contact with and (suspected) exposure to covid-19: Secondary | ICD-10-CM | POA: Diagnosis present

## 2021-01-26 DIAGNOSIS — Z888 Allergy status to other drugs, medicaments and biological substances status: Secondary | ICD-10-CM

## 2021-01-26 DIAGNOSIS — J4520 Mild intermittent asthma, uncomplicated: Secondary | ICD-10-CM

## 2021-01-26 DIAGNOSIS — E2740 Unspecified adrenocortical insufficiency: Secondary | ICD-10-CM | POA: Diagnosis present

## 2021-01-26 DIAGNOSIS — R112 Nausea with vomiting, unspecified: Secondary | ICD-10-CM | POA: Diagnosis present

## 2021-01-26 DIAGNOSIS — D3501 Benign neoplasm of right adrenal gland: Secondary | ICD-10-CM | POA: Diagnosis present

## 2021-01-26 DIAGNOSIS — Z7952 Long term (current) use of systemic steroids: Secondary | ICD-10-CM

## 2021-01-26 DIAGNOSIS — R197 Diarrhea, unspecified: Secondary | ICD-10-CM

## 2021-01-26 DIAGNOSIS — Z720 Tobacco use: Secondary | ICD-10-CM | POA: Diagnosis not present

## 2021-01-26 DIAGNOSIS — Z880 Allergy status to penicillin: Secondary | ICD-10-CM | POA: Diagnosis not present

## 2021-01-26 DIAGNOSIS — R6510 Systemic inflammatory response syndrome (SIRS) of non-infectious origin without acute organ dysfunction: Secondary | ICD-10-CM | POA: Diagnosis not present

## 2021-01-26 DIAGNOSIS — K219 Gastro-esophageal reflux disease without esophagitis: Secondary | ICD-10-CM | POA: Diagnosis present

## 2021-01-26 DIAGNOSIS — A084 Viral intestinal infection, unspecified: Secondary | ICD-10-CM | POA: Diagnosis present

## 2021-01-26 DIAGNOSIS — E876 Hypokalemia: Secondary | ICD-10-CM | POA: Diagnosis present

## 2021-01-26 DIAGNOSIS — F17290 Nicotine dependence, other tobacco product, uncomplicated: Secondary | ICD-10-CM | POA: Diagnosis present

## 2021-01-26 DIAGNOSIS — A419 Sepsis, unspecified organism: Principal | ICD-10-CM | POA: Diagnosis present

## 2021-01-26 DIAGNOSIS — Z79899 Other long term (current) drug therapy: Secondary | ICD-10-CM

## 2021-01-26 DIAGNOSIS — E860 Dehydration: Secondary | ICD-10-CM | POA: Diagnosis present

## 2021-01-26 DIAGNOSIS — J45909 Unspecified asthma, uncomplicated: Secondary | ICD-10-CM | POA: Diagnosis present

## 2021-01-26 DIAGNOSIS — Z9049 Acquired absence of other specified parts of digestive tract: Secondary | ICD-10-CM

## 2021-01-26 DIAGNOSIS — J189 Pneumonia, unspecified organism: Secondary | ICD-10-CM | POA: Diagnosis present

## 2021-01-26 DIAGNOSIS — R109 Unspecified abdominal pain: Secondary | ICD-10-CM

## 2021-01-26 HISTORY — DX: Severe sepsis without septic shock: A41.9

## 2021-01-26 HISTORY — DX: Tobacco use: Z72.0

## 2021-01-26 LAB — URINALYSIS, ROUTINE W REFLEX MICROSCOPIC
Bilirubin Urine: NEGATIVE
Glucose, UA: NEGATIVE mg/dL
Hgb urine dipstick: NEGATIVE
Leukocytes,Ua: NEGATIVE
Nitrite: NEGATIVE
Protein, ur: NEGATIVE mg/dL
Specific Gravity, Urine: 1.02 (ref 1.005–1.030)
pH: 5 (ref 5.0–8.0)

## 2021-01-26 LAB — RESP PANEL BY RT-PCR (FLU A&B, COVID) ARPGX2
Influenza A by PCR: NEGATIVE
Influenza B by PCR: NEGATIVE
SARS Coronavirus 2 by RT PCR: NEGATIVE

## 2021-01-26 LAB — CBC
HCT: 41.1 % (ref 36.0–46.0)
Hemoglobin: 14.4 g/dL (ref 12.0–15.0)
MCH: 34.2 pg — ABNORMAL HIGH (ref 26.0–34.0)
MCHC: 35 g/dL (ref 30.0–36.0)
MCV: 97.6 fL (ref 80.0–100.0)
Platelets: 248 10*3/uL (ref 150–400)
RBC: 4.21 MIL/uL (ref 3.87–5.11)
RDW: 11.6 % (ref 11.5–15.5)
WBC: 13.5 10*3/uL — ABNORMAL HIGH (ref 4.0–10.5)
nRBC: 0 % (ref 0.0–0.2)

## 2021-01-26 LAB — URINALYSIS, COMPLETE (UACMP) WITH MICROSCOPIC
Bacteria, UA: NONE SEEN
Bilirubin Urine: NEGATIVE
Glucose, UA: NEGATIVE mg/dL
Hgb urine dipstick: NEGATIVE
Leukocytes,Ua: NEGATIVE
Nitrite: NEGATIVE
Protein, ur: NEGATIVE mg/dL
Specific Gravity, Urine: 1.02 (ref 1.005–1.030)
pH: 5 (ref 5.0–8.0)

## 2021-01-26 LAB — LIPASE, BLOOD: Lipase: 37 U/L (ref 11–51)

## 2021-01-26 LAB — COMPREHENSIVE METABOLIC PANEL
ALT: 17 U/L (ref 0–44)
AST: 19 U/L (ref 15–41)
Albumin: 4.4 g/dL (ref 3.5–5.0)
Alkaline Phosphatase: 53 U/L (ref 38–126)
Anion gap: 11 (ref 5–15)
BUN: 14 mg/dL (ref 6–20)
CO2: 21 mmol/L — ABNORMAL LOW (ref 22–32)
Calcium: 8.8 mg/dL — ABNORMAL LOW (ref 8.9–10.3)
Chloride: 104 mmol/L (ref 98–111)
Creatinine, Ser: 0.75 mg/dL (ref 0.44–1.00)
GFR, Estimated: >60 mL/min (ref >60)
Glucose, Bld: 114 mg/dL — ABNORMAL HIGH (ref 70–99)
Potassium: 3.1 mmol/L — ABNORMAL LOW (ref 3.5–5.1)
Sodium: 136 mmol/L (ref 135–145)
Total Bilirubin: 0.9 mg/dL (ref 0.3–1.2)
Total Protein: 6.8 g/dL (ref 6.5–8.1)

## 2021-01-26 LAB — LACTIC ACID, PLASMA
Lactic Acid, Venous: 2.3 mmol/L (ref 0.5–1.9)
Lactic Acid, Venous: 2.2 mmol/L (ref 0.5–1.9)
Lactic Acid, Venous: 1 mmol/L (ref 0.5–1.9)

## 2021-01-26 LAB — STREP PNEUMONIAE URINARY ANTIGEN: Strep Pneumo Urinary Antigen: NEGATIVE

## 2021-01-26 LAB — MAGNESIUM: Magnesium: 1.5 mg/dL — ABNORMAL LOW (ref 1.7–2.4)

## 2021-01-26 LAB — HCG, QUANTITATIVE, PREGNANCY: hCG, Beta Chain, Quant, S: 1 m[IU]/mL (ref <5)

## 2021-01-26 LAB — EXPECTORATED SPUTUM ASSESSMENT W GRAM STAIN, RFLX TO RESP C

## 2021-01-26 LAB — PROCALCITONIN: Procalcitonin: 2.67 ng/mL

## 2021-01-26 LAB — HIV ANTIBODY (ROUTINE TESTING W REFLEX): HIV Screen 4th Generation wRfx: NONREACTIVE

## 2021-01-26 LAB — POC URINE PREG, ED: Preg Test, Ur: NEGATIVE

## 2021-01-26 MED ORDER — SODIUM CHLORIDE 0.9 % IV BOLUS
1000.0000 mL | Freq: Once | INTRAVENOUS | Status: AC
  Administered 2021-01-26: 1000 mL via INTRAVENOUS

## 2021-01-26 MED ORDER — SODIUM CHLORIDE 0.9 % IV SOLN
2.0000 g | INTRAVENOUS | Status: DC
  Administered 2021-01-26 – 2021-01-28 (×3): 2 g via INTRAVENOUS
  Filled 2021-01-26: qty 20
  Filled 2021-01-26 (×2): qty 2

## 2021-01-26 MED ORDER — ONDANSETRON HCL 4 MG/2ML IJ SOLN
4.0000 mg | INTRAMUSCULAR | Status: AC
  Administered 2021-01-26: 4 mg via INTRAVENOUS
  Filled 2021-01-26: qty 2

## 2021-01-26 MED ORDER — SODIUM CHLORIDE 0.9 % IV BOLUS
500.0000 mL | Freq: Once | INTRAVENOUS | Status: AC
  Administered 2021-01-26: 500 mL via INTRAVENOUS

## 2021-01-26 MED ORDER — DEXAMETHASONE 0.5 MG PO TABS
0.5000 mg | ORAL_TABLET | Freq: Once | ORAL | Status: AC
  Administered 2021-01-26: 0.5 mg via ORAL
  Filled 2021-01-26: qty 1

## 2021-01-26 MED ORDER — FLUDROCORTISONE ACETATE 0.1 MG PO TABS
0.1000 mg | ORAL_TABLET | Freq: Every day | ORAL | Status: DC
  Administered 2021-01-26 – 2021-01-28 (×3): 0.1 mg via ORAL
  Filled 2021-01-26 (×3): qty 1

## 2021-01-26 MED ORDER — HYDROCORTISONE SOD SUC (PF) 100 MG IJ SOLR
50.0000 mg | Freq: Four times a day (QID) | INTRAMUSCULAR | Status: DC
  Administered 2021-01-26 – 2021-01-28 (×9): 50 mg via INTRAVENOUS
  Filled 2021-01-26 (×2): qty 1
  Filled 2021-01-26: qty 2
  Filled 2021-01-26 (×7): qty 1
  Filled 2021-01-26: qty 2

## 2021-01-26 MED ORDER — POTASSIUM CHLORIDE CRYS ER 20 MEQ PO TBCR
40.0000 meq | EXTENDED_RELEASE_TABLET | Freq: Once | ORAL | Status: DC
  Filled 2021-01-26: qty 2

## 2021-01-26 MED ORDER — SODIUM CHLORIDE 0.9 % IV SOLN
500.0000 mg | INTRAVENOUS | Status: DC
  Administered 2021-01-26 – 2021-01-27 (×2): 500 mg via INTRAVENOUS
  Filled 2021-01-26 (×2): qty 5
  Filled 2021-01-26: qty 500
  Filled 2021-01-26: qty 5

## 2021-01-26 MED ORDER — MORPHINE SULFATE (PF) 2 MG/ML IV SOLN: 1.0000 mg | INTRAVENOUS | Status: DC | PRN

## 2021-01-26 MED ORDER — ALBUTEROL SULFATE (2.5 MG/3ML) 0.083% IN NEBU: 2.5000 mg | INHALATION_SOLUTION | RESPIRATORY_TRACT | Status: DC | PRN

## 2021-01-26 MED ORDER — IBUPROFEN 400 MG PO TABS: 400.0000 mg | ORAL_TABLET | Freq: Three times a day (TID) | ORAL | Status: DC | PRN

## 2021-01-26 MED ORDER — DM-GUAIFENESIN ER 30-600 MG PO TB12: 1.0000 | ORAL_TABLET | Freq: Two times a day (BID) | ORAL | Status: DC | PRN

## 2021-01-26 MED ORDER — NICOTINE 21 MG/24HR TD PT24
21.0000 mg | MEDICATED_PATCH | Freq: Every day | TRANSDERMAL | Status: DC
  Administered 2021-01-27: 21 mg via TRANSDERMAL
  Filled 2021-01-26 (×2): qty 1

## 2021-01-26 MED ORDER — ALBUTEROL SULFATE HFA 108 (90 BASE) MCG/ACT IN AERS: 2.0000 | INHALATION_SPRAY | RESPIRATORY_TRACT | Status: DC | PRN

## 2021-01-26 MED ORDER — MORPHINE SULFATE (PF) 4 MG/ML IV SOLN
4.0000 mg | Freq: Once | INTRAVENOUS | Status: AC
  Administered 2021-01-26: 4 mg via INTRAVENOUS
  Filled 2021-01-26: qty 1

## 2021-01-26 MED ORDER — TRAZODONE HCL 50 MG PO TABS
25.0000 mg | ORAL_TABLET | Freq: Once | ORAL | Status: AC
  Administered 2021-01-26: 25 mg via ORAL
  Filled 2021-01-26: qty 1

## 2021-01-26 MED ORDER — IOHEXOL 300 MG/ML  SOLN
100.0000 mL | Freq: Once | INTRAMUSCULAR | Status: AC | PRN
  Administered 2021-01-26: 100 mL via INTRAVENOUS

## 2021-01-26 MED ORDER — PANTOPRAZOLE SODIUM 40 MG PO TBEC
40.0000 mg | DELAYED_RELEASE_TABLET | Freq: Every day | ORAL | Status: DC
  Administered 2021-01-26 – 2021-01-28 (×3): 40 mg via ORAL
  Filled 2021-01-26 (×4): qty 1

## 2021-01-26 MED ORDER — DEXAMETHASONE 0.5 MG PO TABS
0.2500 mg | ORAL_TABLET | Freq: Every day | ORAL | Status: DC
  Administered 2021-01-26 – 2021-01-27 (×2): 0.25 mg via ORAL
  Filled 2021-01-26 (×3): qty 0.5

## 2021-01-26 MED ORDER — MAGNESIUM SULFATE 2 GM/50ML IV SOLN
2.0000 g | Freq: Once | INTRAVENOUS | Status: AC
  Administered 2021-01-26: 2 g via INTRAVENOUS
  Filled 2021-01-26: qty 50

## 2021-01-26 MED ORDER — POTASSIUM CHLORIDE 20 MEQ PO PACK
40.0000 meq | PACK | Freq: Once | ORAL | Status: AC
  Administered 2021-01-26: 40 meq via ORAL
  Filled 2021-01-26: qty 2

## 2021-01-26 MED ORDER — ACETAMINOPHEN 325 MG PO TABS
650.0000 mg | ORAL_TABLET | Freq: Four times a day (QID) | ORAL | Status: DC | PRN
  Administered 2021-01-26 (×2): 650 mg via ORAL
  Filled 2021-01-26 (×2): qty 2

## 2021-01-26 MED ORDER — OXYCODONE HCL 5 MG PO TABS
5.0000 mg | ORAL_TABLET | ORAL | Status: DC | PRN
  Administered 2021-01-26: 5 mg via ORAL
  Filled 2021-01-26: qty 1

## 2021-01-26 MED ORDER — ENOXAPARIN SODIUM 40 MG/0.4ML IJ SOSY
40.0000 mg | PREFILLED_SYRINGE | INTRAMUSCULAR | Status: DC
  Administered 2021-01-26 – 2021-01-27 (×2): 40 mg via SUBCUTANEOUS
  Filled 2021-01-26 (×2): qty 0.4

## 2021-01-26 MED ORDER — HYDROCORTISONE SOD SUC (PF) 100 MG IJ SOLR
100.0000 mg | Freq: Once | INTRAMUSCULAR | Status: AC
  Administered 2021-01-26: 100 mg via INTRAVENOUS
  Filled 2021-01-26: qty 2

## 2021-01-26 MED ORDER — ADULT MULTIVITAMIN W/MINERALS CH
1.0000 | ORAL_TABLET | Freq: Every day | ORAL | Status: DC
  Administered 2021-01-26 – 2021-01-28 (×3): 1 via ORAL
  Filled 2021-01-26 (×3): qty 1

## 2021-01-26 MED ORDER — ONDANSETRON HCL 4 MG/2ML IJ SOLN: 4.0000 mg | Freq: Three times a day (TID) | INTRAMUSCULAR | Status: DC | PRN

## 2021-01-26 MED ORDER — SODIUM CHLORIDE 0.9 % IV SOLN: INTRAVENOUS | Status: DC

## 2021-01-26 MED ORDER — METHOCARBAMOL 500 MG PO TABS
500.0000 mg | ORAL_TABLET | Freq: Three times a day (TID) | ORAL | Status: DC | PRN
  Filled 2021-01-26: qty 1

## 2021-01-26 NOTE — Plan of Care (Signed)

## 2021-01-26 NOTE — H&P (Addendum)
History and Physical    Patient: Margaret Gates BTD:[id number] DOB: [birth year] DOA: 01/26/2021 DOS: the patient was seen and examined on 01/26/2021 PCP: Patient, No Pcp Per (Inactive)  Patient coming from: Home  Chief Complaint: Nausea, vomiting, diarrhea, abdominal pain  HPI: Margaret Gates is a 41 y.o. female with medical history significant of adrenal insufficiency, adrenal abnormalities, asthma, GERD, headache, tobacco use, who presents with nausea, vomiting, diarrhea, abdominal pain.  Patient states that her symptoms started late afternoon yesterday.  She has nausea and multiple episodes of nonbilious nonbloody vomiting.  Currently has dry heaves mostly.  Patient states that she has 1 small episode of loose stool bowel movement.  She also reports abdominal pain, which is located in the right upper quadrant, constant, sharp, 4 out of 10 in severity, nonradiating.  She has chills, but no fever.  She states that she has dry cough which she attributs to smoking and asthma.  No shortness of breath.  She states she had 1 episode of left-sided mild chest pain earlier, which has resolved.  Currently no active chest pain.  No symptoms of UTI.  She also reports body aches and muscle spasm.  Patient also reports frontal headache, which is dull, mild to moderate, nonradiating.  No neck pain or rigidity.  No vision change.  No unilateral numbness or tinglings in extremities.  No injury.  ED course and data reviewed: I have personally reviewed labs and imaging studies. WBC 13.5, lipase 37, potassium 3.1, liver function normal, negative COVID PCR, negative urinalysis, negative pregnancy test, renal function okay, temperature 99.3, blood pressure 90/54, heart rate 114, RR 20, oxygen saturation 98% on room air.  CT abdomen/pelvis is negative for acute issues, but showed a small right adrenal adenoma.  Right upper quadrant ultrasound is negative for acute issues.  Patient is admitted to [facility] bed as  inpatient.   Review of Systems:   General: no fevers, has chills, no body weight gain, has poor appetite, has fatigue HEENT: no blurry vision, hearing changes or sore throat Respiratory: no dyspnea, has coughing, no wheezing CV: no chest pain, no palpitations GI: has nausea, vomiting, abdominal pain, diarrhea, no constipation GU: no dysuria, burning on urination, increased urinary frequency, hematuria  Ext: no leg edema Neuro: no unilateral weakness, numbness, or tingling, no vision change or hearing loss Skin: no rash, no skin tear. MSK: has muscle spasm, no deformity, no limitation of range of movement in spin Heme: No easy bruising.  Travel history: No recent long distant travel.   Past Medical History:  Diagnosis Date   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HCC)    Asthma    Bronchitis    GERD (gastroesophageal reflux disease)    Headache    Tobacco use    Past Surgical History:  Procedure Laterality Date   CARPAL TUNNEL RELEASE Right 08/31/2020   Procedure: CARPAL TUNNEL RELEASE ENDOSCOPIC;  Surgeon: Corky Mull, MD;  Location: [facility] ORS;  Service: Orthopedics;  Laterality: Right;   CARPAL TUNNEL RELEASE Left 10/26/2020   Procedure: CARPAL TUNNEL RELEASE ENDOSCOPIC;  Surgeon: Corky Mull, MD;  Location: [facility] ORS;  Service: Orthopedics;  Laterality: Left;   CESAREAN SECTION     HIP SURGERY Bilateral    LAPROSCOPIC BONE SPURS   Social History:  reports that she has been smoking cigars. She has never used smokeless tobacco. She reports that she does not drink alcohol and does not use drugs.  Allergies  Allergen Reactions   Penicillins  Unknown reaction (CHILDHOOD) Tolerated 1st generation cephalosporin (CEFAZOLIN) on 03/23/2015, 06/20/2015, 08/31/2020 with no documented ADRs.    Tizanidine Nausea Only   Cyclobenzaprine Nausea Only    Family History  Problem Relation Age of Onset   Scoliosis Mother     Prior to Admission medications   Medication Sig Start Date End Date  Taking? Authorizing Provider  acetaminophen (TYLENOL) 500 MG tablet Take 500-1,000 mg by mouth every 6 (six) hours as needed for moderate pain or headache.    [provider]  albuterol (VENTOLIN HFA) 108 (90 Base) MCG/ACT inhaler Inhale 2 puffs into the lungs every 6 (six) hours as needed for wheezing or shortness of breath.    [provider]  Ascorbic Acid (VITAMIN C PO) Take 1 tablet by mouth daily.    [provider]  dexamethasone (DECADRON) 0.5 MG tablet Take 0.25 mg by mouth at bedtime. 06/29/20   [provider]  fludrocortisone (FLORINEF) 0.1 MG tablet Take 0.1 mg by mouth daily. 07/04/20   [provider]  hydrocortisone (CORTEF) 10 MG tablet Take 15 mg by mouth daily. 07/04/20   [provider]  hydrocortisone sodium succinate (SOLU-CORTEF) 100 MG injection Inject 100 mg as directed as needed (adrenal insufficiency).    Meyer Russel, MD  ibuprofen (ADVIL) 200 MG tablet Take 400-800 mg by mouth every 8 (eight) hours as needed for headache or moderate pain.    [provider]  Multiple Vitamin (MULTIVITAMIN WITH MINERALS) TABS tablet Take 1 tablet by mouth daily.    [provider]  omeprazole (PRILOSEC OTC) 20 MG tablet Take 20 mg by mouth daily.    [provider]  oxyCODONE (ROXICODONE) 5 MG immediate release tablet Take 1-2 tablets (5-10 mg total) by mouth every 4 (four) hours as needed for moderate pain or severe pain. 10/26/20   Corky Mull, MD    Physical Exam: Vitals:   01/26/21 4034 01/26/21 0515 01/26/21 0601 01/26/21 0800  BP: (!) 90/54 (!) 91/56 94/67 96/65   Pulse: (!) 106 (!) 102 (!) 105 98  Resp: 16  20 18   Temp:    98.9 F (37.2 C)  TempSrc:    Oral  SpO2: 94% 92% 98% 100%  Weight:      Height:        Physical exam:  General: Not in acute distress HEENT:       Eyes: PERRL, EOMI, no scleral icterus.       ENT: No discharge from the ears and nose, no pharynx injection, no  tonsillar enlargement.        Neck: No JVD, no bruit, no mass felt. Heme: No neck lymph node enlargement. Cardiac: S1/S2, RRR, No murmurs, No gallops or rubs. Respiratory: No rales, wheezing, rhonchi or rubs. GI: Soft, nondistended, has mild tenderness in RUQ, no rebound pain, no organomegaly, BS present. GU: No hematuria Ext: No pitting leg edema bilaterally. 1+DP/PT pulse bilaterally. Musculoskeletal: No joint deformities, No joint redness or warmth, no limitation of ROM in spin. Skin: No rashes.  Neuro: Alert, oriented X3, cranial nerves II-XII grossly intact, moves all extremities normally Psych: Patient is not psychotic, no suicidal or hemocidal ideation.   Data Reviewed   WBC 13.5, lipase 37, potassium 3.1, liver function normal, negative COVID PCR, negative urinalysis, negative pregnancy test, renal function okay, temperature 99.3, blood pressure 90/54, heart rate 114, RR 20, oxygen saturation 98% on room air.  CT abdomen/pelvis is negative for acute issues, but showed a small right  adrenal adenoma.  Right upper quadrant ultrasound is negative for acute issues.   EKG: not done yet, will get one.  Assessment/Plan * Nausea vomiting and diarrhea- (present on admission) The etiology is not clear.  Clinically patient seems to have viral gastroenteritis.  Patient reports right upper quadrant abdominal pain, but her liver function is normal.  CT scan of abdomen/pelvis not impressive.  Right upper quadrant ultrasound is negative.  Patient reports 1 episode of small amount of loose stool, does not seem to have severe diarrhea, low suspicions for C. Difficile.  Due to history of worsening insufficiency, patient is at high risk of developing adrenal insufficiency.  -Patient is admitted to [place] bed as inpatient by accepting MD -Stress dose of steroid as below -IV fluid: 2.5 L of normal saline bolus in total, then 100 cc/h -As needed Zofran and morphine -If patient has more diarrhea, will  check C. difficile and GI pathogen panel  Adrenal insufficiency ([place])- (present on admission) Patient has history of adrenal insufficiency, currently patient is  taking Decadron 0.25 mg q. at bedtime, Florinef 0.1 mg daily, Cortef 15 mg daily and as needed Solu-Cortef.  Her blood pressures are soft, patient is at high risk of developing adrenal insufficiency.  -Stress dose Solu-Cortef: Patient was given 100 mg of Solu-Cortef in ED, will continue at 50 mg every 6 hours -Florinef 0.1 mg daily -Decadron: Patient was given Decadron 0.5 mg in the ED, will continue home Decadron 0.25 mg q. at bedtime -IV fluid as above  CAP (community acquired pneumonia)- (present on admission) See A &P under severe sepsis  Severe sepsis ([place])- (present on admission) Patient meets criteria for severe sepsis, with WBC 13.5, tachycardia with heart rate 114.  Lactic acid is elevated 2.3. Chest x-ray showed possible lingular infiltration. Procalcitonin level is 2.67. will start antibiotics for possible CAP -IV fluid as above -Follow-up blood culture - IV Rocephin and azithromycin - Mucinex for cough  - Bronchodilators - Urine legionella and S. pneumococcal antigen - Follow up sputum culture  - will trend lactic acid level per sepsis protocol - IVF: as above     Hypomagnesemia- (present on admission) Mg 1.5 -will replete by 2 g of Magnesium sulfate  Hypokalemia- (present on admission) Potassium 3.1 -Repleted potassium   Asthma- (present on admission) Stable. Has mild dry cough.  No wheezing. -As needed albuterol and Mucinex  Tobacco use- (present on admission) - Nicotine patch    Advance Care Planning:   Code Status: Full Code   Consults: none  Family Communication: no, family is not at bedside  DVT PPX: sq Lovenox  Severity of Illness: The appropriate patient status for this patient is INPATIENT. Inpatient status is judged to be reasonable and necessary in order to provide the required  intensity of service to ensure the patient's safety. The patient's presenting symptoms, physical exam findings, and initial radiographic and laboratory data in the context of their chronic comorbidities is felt to place them at high risk for further clinical deterioration. Furthermore, it is not anticipated that the patient will be medically stable for discharge from the hospital within 2 midnights of admission.   * I certify that at the point of admission it is my clinical judgment that the patient will require inpatient hospital care spanning beyond 2 midnights from the point of admission due to high intensity of service, high risk for further deterioration and high frequency of surveillance required.*    Patient has multiple comorbidities, particularly she has history of  adrenal insufficiency.  Now patient presents with nausea, vomiting, diarrhea and abdominal pain, also has SIRS.  Patient has high risk of developing adrenal insufficiency.  And high risk of deteriorating.  Need to be treated in hospital for at least 2 days.  Author: Ivor Costa, MD 01/26/2021 10:09 AM  For on call review [email or web address].

## 2021-01-26 NOTE — Assessment & Plan Note (Addendum)
Stable. Has mild dry cough.  No wheezing. -As needed albuterol and Mucinex

## 2021-01-26 NOTE — Assessment & Plan Note (Addendum)
Patient meets criteria for severe sepsis, with WBC 13.5, tachycardia with heart rate 114.  Lactic acid is elevated 2.3. Chest x-ray showed possible lingular infiltration. Procalcitonin level is 2.67. will start antibiotics for possible CAP -IV fluid as above -Follow-up blood culture - IV Rocephin and azithromycin - Mucinex for cough  - Bronchodilators - Urine legionella and S. pneumococcal antigen - Follow up sputum culture  - will trend lactic acid level per sepsis protocol - IVF: as above

## 2021-01-26 NOTE — ED Notes (Signed)
Pt difficult stick for labs.  Called lab to come attempt.

## 2021-01-26 NOTE — Assessment & Plan Note (Signed)
See A &P under severe sepsis

## 2021-01-26 NOTE — ED Notes (Signed)
Pt transported to floor at this time.

## 2021-01-26 NOTE — Assessment & Plan Note (Addendum)
Potassium 3.1 -Repleted potassium

## 2021-01-26 NOTE — Assessment & Plan Note (Addendum)
The etiology is not clear.  Clinically patient seems to have viral gastroenteritis.  Patient reports right upper quadrant abdominal pain, but her liver function is normal.  CT scan of abdomen/pelvis not impressive.  Right upper quadrant ultrasound is negative.  Patient reports 1 episode of small amount of loose stool, does not seem to have severe diarrhea, low suspicions for C. Difficile.  Due to history of worsening insufficiency, patient is at high risk of developing adrenal insufficiency.  -Patient is admitted to [place] bed as inpatient by accepting MD -Stress dose of steroid as below -IV fluid: 2.5 L of normal saline bolus in total, then 100 cc/h -As needed Zofran and morphine -If patient has more diarrhea, will check C. difficile and GI pathogen panel

## 2021-01-26 NOTE — ED Notes (Signed)
Informed RN bed assigned 

## 2021-01-26 NOTE — ED Triage Notes (Signed)
Pt to ED via EMS from home c/o headache and n/v/d since approx 1730 today.  Denies urinary changes, states headache is gone at this time, states right side abd pain now.  EMS vitals 115 HR, 95% RA, 98.6 temp, 113/91 BP, 123 cbg, given 4mg  zofran IM.  Pt A&Ox4, chest rise even and unlabored, skin WNL and in NAD at this time.

## 2021-01-26 NOTE — Assessment & Plan Note (Signed)
Patient has history of adrenal insufficiency, currently patient is  taking Decadron 0.25 mg q. at bedtime, Florinef 0.1 mg daily, Cortef 15 mg daily and as needed Solu-Cortef.  Her blood pressures are soft, patient is at high risk of developing adrenal insufficiency.  -Stress dose Solu-Cortef: Patient was given 100 mg of Solu-Cortef in ED, will continue at 50 mg every 6 hours -Florinef 0.1 mg daily -Decadron: Patient was given Decadron 0.5 mg in the ED, will continue home Decadron 0.25 mg q. at bedtime -IV fluid as above

## 2021-01-26 NOTE — ED Provider Notes (Signed)
[facility] Provider Note    Event Date/Time   First MD Initiated Contact with Patient 01/26/21 0145     (approximate)   History   Vomiting and Headache   HPI  Loni Delbridge is a 41 y.o. female history of adrenal abnormality on adrenal replacement therapy  Also history of asthma and bronchitis  This evening, she returned from work, and she reports she just got very slight headache.  It went away.  And then she started to experience body aches fevers and chills.  No neck pain or stiffness and her headache is gone now.  She did not take any medication and was relieved on her own but now feels nauseated having pain in her right upper abdomen and right flank.  Also feels generalized muscle aches  She is also had nausea some vomiting and loose stools since about 5:30 PM.  Currently reports achiness throughout all muscles and chills.  Continues to feel nauseated and dehydrated  Not having any ongoing headache.  No numbness or weakness    Denies pregnancy.  Just completed her menstrual cycle, denies any unusual vaginal discharge or vaginal symptoms  Patient reports previous appendectomy  Took her Decadron today.  Reports she would typically take her stress to steroid which is Solu-Cortef 100 mg has not done so yet  Physical Exam   Triage Vital Signs: ED Triage Vitals  Enc Vitals Group     BP 01/26/21 0024 107/74     Pulse Rate 01/26/21 0024 (!) 108     Resp 01/26/21 0024 16     Temp 01/26/21 0024 99.3 F (37.4 C)     Temp Source 01/26/21 0024 Axillary     SpO2 01/26/21 0024 96 %     Weight 01/26/21 0023 170 lb (77.1 kg)     Height 01/26/21 0023 5\' 6"  (1.676 m)     Head Circumference --      Peak Flow --      Pain Score 01/26/21 0023 2     Pain Loc --      Pain Edu? --      Excl. in [place]? --     Most recent vital signs: Vitals:   01/26/21 0515 01/26/21 0601  BP: (!) 91/56 94/67  Pulse: (!) 102 (!) 105  Resp:  20  Temp:    SpO2: 92%  98%     General: Awake, no distress.  Pleasant but appears mildly ill.  She reports feeling fatigued.  No headache. No meningismus.  No photophobia.  Denies headache at present CV:  Good peripheral perfusion.  Slightly tachycardic no murmurs rubs or gallops Resp:  Normal effort.  Clear lung sounds throughout.  No respiratory distress. Abd:  No distention.  Reports mild to moderate pain without rebound or guarding in the right mid flank and right upper quadrant.  Equivocal Murphy.  No rebound or guarding tenderness.  No pain across the epigastrium or left side of the abdomen.  No focal right lower quadrant pain. Other:  No rash. Mild tenderness to percussion of the right costovertebral angle none noted on the left.   ED Results / Procedures / Treatments   Labs (all labs ordered are listed, but only abnormal results are displayed) Labs Reviewed  COMPREHENSIVE METABOLIC PANEL - Abnormal; Notable for the following components:      Result Value   Potassium 3.1 (*)    CO2 21 (*)    Glucose, Bld 114 (*)  Calcium 8.8 (*)    All other components within normal limits  CBC - Abnormal; Notable for the following components:   WBC 13.5 (*)    MCH 34.2 (*)    All other components within normal limits  URINALYSIS, ROUTINE W REFLEX MICROSCOPIC - Abnormal; Notable for the following components:   Ketones, ur TRACE (*)    All other components within normal limits  RESP PANEL BY RT-PCR (FLU A&B, COVID) ARPGX2  URINE CULTURE  LIPASE, BLOOD  HCG, QUANTITATIVE, PREGNANCY  LACTIC ACID, PLASMA  URINALYSIS, COMPLETE (UACMP) WITH MICROSCOPIC  POC URINE PREG, ED     EKG     RADIOLOGY    Personally viewed and interpreted the patient's right upper quadrant images which do not demonstrate any notable gallstones or evidence of acute biliary process.  Also reviewed the radiologist report which notes no acute finding in the right upper quadrant   CT abdomen pelvis results reviewed as dictated by  radiologist IMPRESSION: 1. No acute finding. 2. Small right adrenal adenoma. Electronically Signed   By: Jorje Guild M.D.   On: 01/26/2021 05:26     PROCEDURES:  Critical Care performed: No  Procedures   MEDICATIONS ORDERED IN ED: Medications  dexamethasone (DECADRON) tablet 0.5 mg (has no administration in time range)  fludrocortisone (FLORINEF) tablet 0.1 mg (has no administration in time range)  ondansetron (ZOFRAN) injection 4 mg (4 mg Intravenous Given 01/26/21 0217)  sodium chloride 0.9 % bolus 1,000 mL (0 mLs Intravenous Stopped 01/26/21 0547)  morphine 4 MG/ML injection 4 mg (4 mg Intravenous Given 01/26/21 0222)  hydrocortisone sodium succinate (SOLU-CORTEF) 100 MG injection 100 mg (100 mg Intravenous Given 01/26/21 0226)  potassium chloride (KLOR-CON) packet 40 mEq (40 mEq Oral Given 01/26/21 0513)  iohexol (OMNIPAQUE) 300 MG/ML solution 100 mL (100 mLs Intravenous Contrast Given 01/26/21 0451)  sodium chloride 0.9 % bolus 1,000 mL (1,000 mLs Intravenous New Bag/Given 01/26/21 0602)     IMPRESSION / MDM / ASSESSMENT AND PLAN / ED COURSE  I reviewed the triage vital signs and the nursing notes.                              Differential diagnosis includes, but is not limited to, viral syndrome, urinary tract infection pyelonephritis, influenza, colitis acute gastroenteritis.  Started with a very mild headache then becoming generalized body aches myalgias focal right upper to right mid abdominal pain with loose stools diarrhea and vomiting.  Headache now resolved.  No evidence of meningitis or meningeal irritation.  Headache self resolved.  She does have adrenal insufficiency, has been given stress dose steroid here.  Noted to have mild hypotension and slight tachycardia and in the setting of persistent mild to moderate hypotension and persistent tachycardia after receiving stress dose steroids and IV fluid plan to admit her for further care and management.  Consultation for  admission and case reviewed by Dr. Sidney Ace  9 given stress dose steroid, IV fluid bolus antiemetic.  Right upper quadrant ultrasound negative for acute pathology, will obtain CT imaging the abdomen pelvis to further evaluate and exclude acute intra-abdominal pathology  ----------------------------------------- 4:32 AM on 01/26/2021 ----------------------------------------- Urinalysis without evidence of infection.  Reassuring right upper quadrant ultrasound.  Labs demonstrate mild leukocytosis, mild hypokalemia which we will replete.  No acute cardiopulmonary symptoms.  No acute neurologic symptoms.  Symptoms seems to be suggestive of possible viral illness, reassuring work-up to this point but will  obtain CT imaging given the patient's ongoing right flank discomfort which is improving after morphine and fluids.   ----------------------------------------- 6:08 AM on 01/26/2021 ----------------------------------------- Patient be admitted, hospitalist has been consulted and accepted by Dr. Sidney Ace  At this point I suspect likely viral illness but I think her adrenal insufficiency may be leading to some element of adrenal crisis given hypotension and tachycardia.  Overall she feels much improved she is eating and drinking feels better.  She does report a past that she has had incidences where she is needed received multiple bags of IV fluids and steroids.  I have ordered a complete urinalysis to exclude bacteria in the urine and also a culture given the associated right flank pain right costovertebral angle tenderness but her routine urinalysis does not clearly demonstrate infection  Also added on chest x-ray to evaluate for pulmonary disease though no clear clinical symptomatology to suggest chest infection, but given the unknown nature of her fever again I suspect is likely viral at this point I do wish to exclude pneumonia particular her right lower lobe given location of pain  Patient maintained  on monitor to evaluate and exclude cardiac arrhythmias         FINAL CLINICAL IMPRESSION(S) / ED DIAGNOSES   Final diagnoses:  Abdominal pain  Adrenal insufficiency (HCC)  SIRS (systemic inflammatory response syndrome) ([place])     Rx / DC Orders   ED Discharge Orders     None        Note:  This document was prepared using Dragon voice recognition software and may include unintentional dictation errors.   Delman Kitten, MD 01/26/21 [phone]

## 2021-01-26 NOTE — Assessment & Plan Note (Signed)
-  Nicotine patch 

## 2021-01-26 NOTE — Assessment & Plan Note (Signed)
Mg 1.5 -will replete by 2 g of Magnesium sulfate

## 2021-01-27 DIAGNOSIS — J189 Pneumonia, unspecified organism: Secondary | ICD-10-CM

## 2021-01-27 LAB — CBC
HCT: 33.7 % — ABNORMAL LOW (ref 36.0–46.0)
Hemoglobin: 11.4 g/dL — ABNORMAL LOW (ref 12.0–15.0)
MCH: 33.6 pg (ref 26.0–34.0)
MCHC: 33.8 g/dL (ref 30.0–36.0)
MCV: 99.4 fL (ref 80.0–100.0)
Platelets: 205 10*3/uL (ref 150–400)
RBC: 3.39 MIL/uL — ABNORMAL LOW (ref 3.87–5.11)
RDW: 11.9 % (ref 11.5–15.5)
WBC: 5.8 10*3/uL (ref 4.0–10.5)
nRBC: 0 % (ref 0.0–0.2)

## 2021-01-27 LAB — BASIC METABOLIC PANEL
Anion gap: 4 — ABNORMAL LOW (ref 5–15)
BUN: 7 mg/dL (ref 6–20)
CO2: 23 mmol/L (ref 22–32)
Calcium: 8.2 mg/dL — ABNORMAL LOW (ref 8.9–10.3)
Chloride: 110 mmol/L (ref 98–111)
Creatinine, Ser: 0.6 mg/dL (ref 0.44–1.00)
GFR, Estimated: >60 mL/min (ref >60)
Glucose, Bld: 121 mg/dL — ABNORMAL HIGH (ref 70–99)
Potassium: 3.7 mmol/L (ref 3.5–5.1)
Sodium: 137 mmol/L (ref 135–145)

## 2021-01-27 LAB — MAGNESIUM: Magnesium: 2.1 mg/dL (ref 1.7–2.4)

## 2021-01-27 LAB — LEGIONELLA PNEUMOPHILA SEROGP 1 UR AG: L. pneumophila Serogp 1 Ur Ag: NEGATIVE

## 2021-01-27 LAB — URINE CULTURE: Culture: <10000 — AB

## 2021-01-27 LAB — GLUCOSE, CAPILLARY: Glucose-Capillary: 110 mg/dL — ABNORMAL HIGH (ref 70–99)

## 2021-01-27 NOTE — Plan of Care (Signed)
°  Problem: Education: Goal: Knowledge of General Education information will improve Description: Including pain rating scale, medication(s)/side effects and non-pharmacologic comfort measures Outcome: Progressing   Problem: Health Behavior/Discharge Planning: Goal: Ability to manage health-related needs will improve Outcome: Progressing   Problem: Clinical Measurements: Goal: Ability to maintain clinical measurements within normal limits will improve Outcome: Progressing Goal: Will remain free from infection Outcome: Progressing Goal: Respiratory complications will improve Outcome: Progressing   Problem: Activity: Goal: Risk for activity intolerance will decrease Outcome: Progressing   Problem: Nutrition: Goal: Adequate nutrition will be maintained Outcome: Progressing   Problem: Coping: Goal: Level of anxiety will decrease Outcome: Progressing   Problem: Elimination: Goal: Will not experience complications related to bowel motility Outcome: Progressing Goal: Will not experience complications related to urinary retention Outcome: Progressing   Problem: Pain Managment: Goal: General experience of comfort will improve Outcome: Progressing   Problem: Safety: Goal: Ability to remain free from injury will improve Outcome: Progressing   Problem: Skin Integrity: Goal: Risk for impaired skin integrity will decrease Outcome: Progressing   

## 2021-01-27 NOTE — TOC Initial Note (Signed)
Transition of Care [facility]) - Initial/Assessment Note    Patient Details  Name: Margaret Gates MRN: [id number] Date of Birth: Mar 17, 1980  Transition of Care [facility]) CM/SW Contact:    Beverly Sessions, RN Phone Number: 01/27/2021, 9:54 AM  Clinical Narrative:                  Transition of Care (TOC) Screening Note   Patient Details  Name: Margaret Gates Date of Birth: [birth year]   Transition of Care [facility]) CM/SW Contact:    Beverly Sessions, RN Phone Number: 01/27/2021, 9:54 AM    Transition of Care Department [facility]) has reviewed patient and no TOC needs have been identified at this time. We will continue to monitor patient advancement through interdisciplinary progression rounds. If new patient transition needs arise, please place a TOC consult.          Patient Goals and CMS Choice        Expected Discharge Plan and Services                                                Prior Living Arrangements/Services                       Activities of Daily Living Home Assistive Devices/Equipment: None ADL Screening (condition at time of admission) Patient's cognitive ability adequate to safely complete daily activities?: Yes Is the patient deaf or have difficulty hearing?: No Does the patient have difficulty seeing, even when wearing glasses/contacts?: No Does the patient have difficulty concentrating, remembering, or making decisions?: No Patient able to express need for assistance with ADLs?: No Does the patient have difficulty dressing or bathing?: No Independently performs ADLs?: Yes (appropriate for developmental age) Does the patient have difficulty walking or climbing stairs?: No Weakness of Legs: None Weakness of Arms/Hands: None  Permission Sought/Granted                  Emotional Assessment              Admission diagnosis:  Adrenal insufficiency (HCC) [E27.40] SIRS (systemic inflammatory response syndrome)  (HCC) [R65.10] Nausea vomiting and diarrhea [R11.2, R19.7] Abdominal pain [R10.9] Patient Active Problem List   Diagnosis Date Noted   Adrenal insufficiency ([place]) 01/26/2021   Nausea vomiting and diarrhea 01/26/2021   Severe sepsis ([place]) 01/26/2021   Hypokalemia 01/26/2021   Asthma 01/26/2021   GERD (gastroesophageal reflux disease) 01/26/2021   Tobacco use 01/26/2021   Hypomagnesemia 01/26/2021   CAP (community acquired pneumonia) 01/26/2021   PCP:  Patient, No Pcp Per (Inactive) Pharmacy:   [facility] [address], [place] [address] Phone: [phone] Fax: [phone]     Social Determinants of Health (SDOH) Interventions    Readmission Risk Interventions No flowsheet data found.

## 2021-01-27 NOTE — Progress Notes (Signed)
PROGRESS NOTE    Margaret Gates  MWU:[id number] DOB: April 16, 1980 DOA: 01/26/2021 PCP: Patient, No Pcp Per (Inactive)    Assessment & Plan:   Principal Problem:   Nausea vomiting and diarrhea Active Problems:   Adrenal insufficiency (HCC)   Severe sepsis (HCC)   Hypokalemia   Asthma   Tobacco use   Hypomagnesemia   CAP (community acquired pneumonia)   Nausea vomiting and diarrhea: etiology unclear, possible viral gastroenteritis. CT abd/pelvis shows no acute findings. [place] or RUQ is unremarkable   Adrenal insufficiency: continue on home dose of decadron, florinef. Continue on stress dose steroids w/ solu-cortef. Will restart home dose of cortef when stress dose solu-cortef have been d/c    CAP: continue on IV rocephin, azithromycin, bronchodilators and encourage incentive spirometry. Legionella is pending. Strep is neg    Severe sepsis: met criteria w/ leukocytosis, tachycardia, elevated lactic acid & possible pneumonia. Procal 2.67. Continue on IV rocephin, azithromycin & bronchodilators. Legionella is pending. Strep is neg    Hypomagnesemia: WNL today    Hypokalemia: WNL today    Asthma: unknown stage and/or severity. Continue on bronchodilators    Tobacco use: smoking cessation counseling. Nicotine patch to prevent w/drawl    DVT prophylaxis: lovenox  Code Status: full  Family Communication:  Disposition Plan: likely d/c pt back home  Level of care: Med-Surg  Status is: Inpatient  Remains inpatient appropriate because: severity of illness      Consultants:    Procedures:   Antimicrobials: rocephin, azithromycin    Subjective: Pt c/o malaise   Objective: Vitals:   01/26/21 1604 01/26/21 1743 01/26/21 1929 01/27/21 0445  BP: 104/64 93/60 101/70 92/64  Pulse: 94 87 94 84  Resp: _0 Temp: 98.7 F (37.1 C) 98.7 F (37.1 C) 98.6 F (37 C) 98.2 F (36.8 C)  TempSrc: Oral Oral Oral Oral  SpO2: 98% 98% 98% 98%  Weight:      Height:         Intake/Output Summary (Last 24 hours) at 01/27/2021 0729 Last data filed at 01/26/2021 1336 Gross per 24 hour  Intake 517.18 ml  Output --  Net 517.18 ml   Filed Weights   01/26/21 0023  Weight: 77.1 kg    Examination:  General exam: Appears calm and comfortable  Respiratory system: diminished breath sounds b/l  Cardiovascular system: S1 & S2+. No rubs, gallops or clicks.  Gastrointestinal system: Abdomen is nondistended, soft and nontender. Normal bowel sounds heard. Central nervous system: Alert and oriented. Moves all extremities  Psychiatry: Judgement and insight appear normal. Mood & affect appropriate.     Data Reviewed: I have personally reviewed following labs and imaging studies  CBC: Recent Labs  Lab 01/26/21 0039 01/27/21 0421  WBC 13.5* 5.8  HGB 14.4 11.4*  HCT 41.1 33.7*  MCV 97.6 99.4  PLT 248 725   Basic Metabolic Panel: Recent Labs  Lab 01/26/21 0039 01/26/21 0833 01/27/21 0421  NA 136  --  137  K 3.1*  --  3.7  CL 104  --  110  CO2 21*  --  23  GLUCOSE 114*  --  121*  BUN 14  --  7  CREATININE 0.75  --  0.60  CALCIUM 8.8*  --  8.2*  MG  --  1.5*  --    GFR: Estimated Creatinine Clearance: 97 mL/min (by C-G formula based on SCr of 0.6 mg/dL). Liver Function Tests: Recent Labs  Lab 01/26/21 [phone]  AST 19  ALT 17  ALKPHOS 53  BILITOT 0.9  PROT 6.8  ALBUMIN 4.4   Recent Labs  Lab 01/26/21 0039  LIPASE 37   No results for input(s): AMMONIA in the last 168 hours. Coagulation Profile: No results for input(s): INR, PROTIME in the last 168 hours. Cardiac Enzymes: No results for input(s): CKTOTAL, CKMB, CKMBINDEX, TROPONINI in the last 168 hours. BNP (last 3 results) No results for input(s): PROBNP in the last 8760 hours. HbA1C: No results for input(s): HGBA1C in the last 72 hours. CBG: No results for input(s): GLUCAP in the last 168 hours. Lipid Profile: No results for input(s): CHOL, HDL, LDLCALC, TRIG, CHOLHDL, LDLDIRECT  in the last 72 hours. Thyroid Function Tests: No results for input(s): TSH, T4TOTAL, FREET4, T3FREE, THYROIDAB in the last 72 hours. Anemia Panel: No results for input(s): VITAMINB12, FOLATE, FERRITIN, TIBC, IRON, RETICCTPCT in the last 72 hours. Sepsis Labs: Recent Labs  Lab 01/26/21 0606 01/26/21 0833 01/26/21 1412  PROCALCITON  --  2.67  --   LATICACIDVEN 2.3* 2.2* 1.0    Recent Results (from the past 240 hour(s))  Resp Panel by RT-PCR (Flu A&B, Covid) Nasopharyngeal Swab     Status: None   Collection Time: 01/26/21 12:32 AM   Specimen: Nasopharyngeal Swab; Nasopharyngeal(NP) swabs in vial transport medium  Result Value Ref Range Status   SARS Coronavirus 2 by RT PCR NEGATIVE NEGATIVE Final    Comment: (NOTE) SARS-CoV-2 target nucleic acids are NOT DETECTED.  The SARS-CoV-2 RNA is generally detectable in upper respiratory specimens during the acute phase of infection. The lowest concentration of SARS-CoV-2 viral copies this assay can detect is 138 copies/mL. A negative result does not preclude SARS-Cov-2 infection and should not be used as the sole basis for treatment or other patient management decisions. A negative result may occur with  improper specimen collection/handling, submission of specimen other than nasopharyngeal swab, presence of viral mutation(s) within the areas targeted by this assay, and inadequate number of viral copies(<138 copies/mL). A negative result must be combined with clinical observations, patient history, and epidemiological information. The expected result is Negative.  Fact Sheet for Patients:  EntrepreneurPulse.com.au  Fact Sheet for Healthcare Providers:  IncredibleEmployment.be  This test is no t yet approved or cleared by the [place] FDA and  has been authorized for detection and/or diagnosis of SARS-CoV-2 by FDA under an Emergency Use Authorization (EUA). This EUA will remain  in effect  (meaning this test can be used) for the duration of the COVID-19 declaration under Section 564(b)(1) of the Act, 21 U.S.C.section 360bbb-3(b)(1), unless the authorization is terminated  or revoked sooner.       Influenza A by PCR NEGATIVE NEGATIVE Final   Influenza B by PCR NEGATIVE NEGATIVE Final    Comment: (NOTE) The Xpert Xpress SARS-CoV-2/FLU/RSV plus assay is intended as an aid in the diagnosis of influenza from Nasopharyngeal swab specimens and should not be used as a sole basis for treatment. Nasal washings and aspirates are unacceptable for Xpert Xpress SARS-CoV-2/FLU/RSV testing.  Fact Sheet for Patients: EntrepreneurPulse.com.au  Fact Sheet for Healthcare Providers: IncredibleEmployment.be  This test is not yet approved or cleared by the [place] FDA and has been authorized for detection and/or diagnosis of SARS-CoV-2 by FDA under an Emergency Use Authorization (EUA). This EUA will remain in effect (meaning this test can be used) for the duration of the COVID-19 declaration under Section 564(b)(1) of the Act, 21 U.S.C. section 360bbb-3(b)(1), unless the authorization is terminated  or revoked.  Performed at [facility], [address] [place], [place] [address]   CULTURE, BLOOD (ROUTINE X 2) w Reflex to ID Panel     Status: None (Preliminary result)   Collection Time: 01/26/21  8:40 AM   Specimen: BLOOD  Result Value Ref Range Status   Specimen Description BLOOD BLOOD LEFT WRIST  Final   Special Requests   Final    BOTTLES DRAWN AEROBIC AND ANAEROBIC Blood Culture adequate volume   Culture   Final    NO GROWTH < 24 HOURS Performed at [facility], [address] [place], [place] [address]    Report Status PENDING  Incomplete  CULTURE, BLOOD (ROUTINE X 2) w Reflex to ID Panel     Status: None (Preliminary result)   Collection Time: 01/26/21  8:47 AM   Specimen: BLOOD  Result Value Ref Range Status    Specimen Description BLOOD LEFT ANTECUBITAL  Final   Special Requests   Final    BOTTLES DRAWN AEROBIC AND ANAEROBIC Blood Culture adequate volume   Culture   Final    NO GROWTH < 24 HOURS Performed at [facility], [address], [place], [place] [address]    Report Status PENDING  Incomplete  Expectorated Sputum Assessment w Gram Stain, Rflx to Resp Cult     Status: None   Collection Time: 01/26/21  4:03 PM   Specimen: Expectorated Sputum  Result Value Ref Range Status   Specimen Description EXPECTORATED SPUTUM  Final   Special Requests NONE  Final   Sputum evaluation   Final    Sputum specimen not acceptable for testing.  Please recollect.   RESULT CALLED TO, READ BACK BY AND VERIFIED WITH: JASMYNE DAVIS _0  ON 01/26/21 SKL Performed at [facility], [address] [place], [place] [address]    Report Status 01/26/2021 FINAL  Final         Radiology Studies: DG Chest 2 View  Result Date: 01/26/2021 CLINICAL DATA:  Fever.  Sepsis workup. EXAM: CHEST - 2 VIEW COMPARISON:  PA chest and rib series 01/03/2017 FINDINGS: The heart size and mediastinal contours are within normal limits. PA there is increased opacity in the pericardiac left base obscuring left heart border, concerning for a lingular infiltrate although not well localized on the lateral view. This was not seen previously. No pleural effusion is seen. The visualized skeletal structures are unremarkable. IMPRESSION: Findings concerning for a lingular infiltrate on the PA but is not well seen on the lateral view, alternative explanation would be interval scarring in the area. Electronically Signed   By: Telford Nab M.D.   On: 01/26/2021 06:46   CT ABDOMEN PELVIS W CONTRAST  Result Date: 01/26/2021 CLINICAL DATA:  Flank pain with kidney stone suspected. Pain at the right upper quadrant 2 right flank. EXAM: CT ABDOMEN AND PELVIS WITH CONTRAST TECHNIQUE: Multidetector CT imaging of the abdomen and  pelvis was performed using the standard protocol following bolus administration of intravenous contrast. RADIATION DOSE REDUCTION: This exam was performed according to the departmental dose-optimization program which includes automated exposure control, adjustment of the mA and/or kV according to patient size and/or use of iterative reconstruction technique. CONTRAST:  141m OMNIPAQUE IOHEXOL 300 MG/ML  SOLN COMPARISON:  None. FINDINGS: Lower chest:  Subpleural opacity most consistent with atelectasis. Hepatobiliary: Possible hepatic steatosis. No focal liver lesion.No evidence of biliary obstruction or stone. Pancreas: Unremarkable. Spleen: Unremarkable. Adrenals/Urinary Tract: 13 mm right adrenal nodule. No specific imaging  features but stable from a 2015 lumbar MRI, most consistent with adenoma. No hydronephrosis or stone. Unremarkable bladder. Stomach/Bowel:  No obstruction. No appendicitis. Vascular/Lymphatic: No acute vascular abnormality. No mass or adenopathy. Reproductive:No pathologic findings. Other: No ascites or pneumoperitoneum. Musculoskeletal: No acute abnormalities. IMPRESSION: 1. No acute finding. 2. Small right adrenal adenoma. Electronically Signed   By: Jorje Guild M.D.   On: 01/26/2021 05:26   US ABDOMEN LIMITED RUQ (LIVER/GB)  Result Date: 01/26/2021 CLINICAL DATA:  Right upper quadrant abdominal pain. EXAM: ULTRASOUND ABDOMEN LIMITED RIGHT UPPER QUADRANT COMPARISON:  None. FINDINGS: Gallbladder: No gallstones or wall thickening visualized. No sonographic Murphy sign noted by sonographer. Common bile duct: Diameter: 5 mm Liver: No focal lesion identified. Within normal limits in parenchymal echogenicity. Portal vein is patent on color Doppler imaging with normal direction of blood flow towards the liver. Other: None. IMPRESSION: Unremarkable right upper quadrant ultrasound. Electronically Signed   By: Anner Crete M.D.   On: 01/26/2021 02:43        Scheduled Meds:   dexamethasone  0.25 mg Oral QHS   enoxaparin (LOVENOX) injection  40 mg Subcutaneous Q24H   fludrocortisone  0.1 mg Oral Daily   hydrocortisone sodium succinate  50 mg Intravenous Q6H   multivitamin with minerals  1 tablet Oral Daily   nicotine  21 mg Transdermal Daily   pantoprazole  40 mg Oral Daily   Continuous Infusions:  sodium chloride 100 mL/hr at 01/26/21 1749   azithromycin Stopped (01/26/21 1304)   cefTRIAXone (ROCEPHIN)  IV Stopped (01/26/21 1146)     LOS: 1 day    Time spent: 31 mins     Wyvonnia Dusky, MD [facility] Pager [phone]  If 7PM-7AM, please contact night-coverage 01/27/2021, 7:29 AM

## 2021-01-28 LAB — CBC
HCT: 34.3 % — ABNORMAL LOW (ref 36.0–46.0)
Hemoglobin: 11.3 g/dL — ABNORMAL LOW (ref 12.0–15.0)
MCH: 33.1 pg (ref 26.0–34.0)
MCHC: 32.9 g/dL (ref 30.0–36.0)
MCV: 100.6 fL — ABNORMAL HIGH (ref 80.0–100.0)
Platelets: 219 10*3/uL (ref 150–400)
RBC: 3.41 MIL/uL — ABNORMAL LOW (ref 3.87–5.11)
RDW: 12.1 % (ref 11.5–15.5)
WBC: 8 10*3/uL (ref 4.0–10.5)
nRBC: 0 % (ref 0.0–0.2)

## 2021-01-28 LAB — BASIC METABOLIC PANEL
Anion gap: 10 (ref 5–15)
BUN: 7 mg/dL (ref 6–20)
CO2: 21 mmol/L — ABNORMAL LOW (ref 22–32)
Calcium: 8.9 mg/dL (ref 8.9–10.3)
Chloride: 110 mmol/L (ref 98–111)
Creatinine, Ser: 0.63 mg/dL (ref 0.44–1.00)
GFR, Estimated: >60 mL/min (ref >60)
Glucose, Bld: 119 mg/dL — ABNORMAL HIGH (ref 70–99)
Potassium: 3.3 mmol/L — ABNORMAL LOW (ref 3.5–5.1)
Sodium: 141 mmol/L (ref 135–145)

## 2021-01-28 LAB — GLUCOSE, CAPILLARY: Glucose-Capillary: 116 mg/dL — ABNORMAL HIGH (ref 70–99)

## 2021-01-28 MED ORDER — AZITHROMYCIN 250 MG PO TABS
500.0000 mg | ORAL_TABLET | Freq: Every day | ORAL | Status: DC
  Administered 2021-01-28: 500 mg via ORAL
  Filled 2021-01-28: qty 2

## 2021-01-28 MED ORDER — POTASSIUM CHLORIDE CRYS ER 20 MEQ PO TBCR
40.0000 meq | EXTENDED_RELEASE_TABLET | Freq: Once | ORAL | Status: AC
  Administered 2021-01-28: 40 meq via ORAL
  Filled 2021-01-28: qty 2

## 2021-01-28 MED ORDER — AZITHROMYCIN 500 MG PO TABS: 500.0000 mg | ORAL_TABLET | Freq: Every day | ORAL | 0 refills | Status: AC

## 2021-01-28 NOTE — Discharge Summary (Signed)
Physician Discharge Summary  Aliyana Dlugosz Lecy NUU:[id number] DOB: July 04, 1980 DOA: 01/26/2021  PCP: Patient, No Pcp Per (Inactive)  Admit date: 01/26/2021 Discharge date: 01/28/2021  Admitted From: home  Disposition: home   Recommendations for Outpatient Follow-up:  Follow up with PCP in 1-2 weeks   Home Health: no  Equipment/Devices:  Discharge Condition: stable  CODE STATUS: full  Diet recommendation: regular   Brief/Interim Summary: HPI was taken from Dr. Blaine Hamper: Luretha Murphy [place]cott is a 41 y.o. female with medical history significant of adrenal insufficiency, adrenal abnormalities, asthma, GERD, headache, tobacco use, who presents with nausea, vomiting, diarrhea, abdominal pain.   Patient states that her symptoms started late afternoon yesterday.  She has nausea and multiple episodes of nonbilious nonbloody vomiting.  Currently has dry heaves mostly.  Patient states that she has 1 small episode of loose stool bowel movement.  She also reports abdominal pain, which is located in the right upper quadrant, constant, sharp, 4 out of 10 in severity, nonradiating.  She has chills, but no fever.  She states that she has dry cough which she attributs to smoking and asthma.  No shortness of breath.  She states she had 1 episode of left-sided mild chest pain earlier, which has resolved.  Currently no active chest pain.  No symptoms of UTI.  She also reports body aches and muscle spasm.  Patient also reports frontal headache, which is dull, mild to moderate, nonradiating.  No neck pain or rigidity.  No vision change.  No unilateral numbness or tinglings in extremities.  No injury.   ED course and data reviewed: I have personally reviewed labs and imaging studies. WBC 13.5, lipase 37, potassium 3.1, liver function normal, negative COVID PCR, negative urinalysis, negative pregnancy test, renal function okay, temperature 99.3, blood pressure 90/54, heart rate 114, RR 20, oxygen saturation 98% on room  air.  CT abdomen/pelvis is negative for acute issues, but showed a small right adrenal adenoma.  Right upper quadrant ultrasound is negative for acute issues.  Patient is admitted to [facility] bed as inpatient.    Hospital course from Dr. Jimmye Norman 1/27-1/28/23: Pt was found to have pneumonia and was treated w/ IV rocephin, azithromycin, steroids, bronchodilators and incentive spirometry. Pt also did receive stress dose steroids while inpatient as pt hx of adrenal insufficiency. Pt was ambulating and transferring independently so OT/PT was not needed.   Discharge Diagnoses:  Principal Problem:   Nausea vomiting and diarrhea Active Problems:   Adrenal insufficiency (HCC)   Severe sepsis (HCC)   Hypokalemia   Asthma   Tobacco use   Hypomagnesemia   CAP (community acquired pneumonia)  Nausea vomiting and diarrhea: etiology unclear, possible viral gastroenteritis. CT abd/pelvis shows no acute findings. [place] or RUQ is unremarkable. Resolved    Adrenal insufficiency: continue on home dose of decadron, florinef. Continue on stress dose steroids. Will restart home dose of cortef when stress dose solu-cortef have been d/c    CAP: continue on IV rocephin, azithromycin, bronchodilators and encourage incentive spirometry. Strep, legionella are both neg    Severe sepsis: met criteria w/ leukocytosis, tachycardia, elevated lactic acid & possible pneumonia. Procal 2.67. Continue on IV rocephin, azithromycin & bronchodilators.Strep, legionella are both neg. Resolved    Hypomagnesemia: WNL   Hypokalemia: potassium given    Asthma: unknown stage and/or severity. Continue on bronchodilators    Tobacco use: smoking cessation counseling. Nicotine patch to prevent w/drawl   Discharge Instructions  Discharge Instructions     Diet general  Complete by: As directed    Discharge instructions   Complete by: As directed    F/u w/ PCP in 1-2 weeks   Increase activity slowly   Complete by: As directed        Allergies as of 01/28/2021       Reactions   Penicillins    Unknown reaction (CHILDHOOD) Tolerated 1st generation cephalosporin (CEFAZOLIN) on 03/23/2015, 06/20/2015, 08/31/2020 with no documented ADRs.    Tizanidine Nausea Only   Cyclobenzaprine Nausea Only        Medication List     STOP taking these medications    oxyCODONE 5 MG immediate release tablet Commonly known as: Roxicodone       TAKE these medications    acetaminophen 500 MG tablet Commonly known as: TYLENOL Take 500-1,000 mg by mouth every 6 (six) hours as needed for moderate pain or headache.   albuterol 108 (90 Base) MCG/ACT inhaler Commonly known as: VENTOLIN HFA Inhale 2 puffs into the lungs every 6 (six) hours as needed for wheezing or shortness of breath.   azithromycin 500 MG tablet Commonly known as: Zithromax Take 1 tablet (500 mg total) by mouth daily for 3 days. Take 1 tablet daily for 3 days.   dexamethasone 0.5 MG tablet Commonly known as: DECADRON Take 0.25 mg by mouth at bedtime.   fludrocortisone 0.1 MG tablet Commonly known as: FLORINEF Take 0.1 mg by mouth daily.   hydrocortisone 10 MG tablet Commonly known as: CORTEF Take 15 mg by mouth daily.   hydrocortisone sodium succinate 100 MG injection Commonly known as: SOLU-CORTEF Inject 100 mg as directed as needed (adrenal insufficiency).   ibuprofen 200 MG tablet Commonly known as: ADVIL Take 400-800 mg by mouth every 8 (eight) hours as needed for headache or moderate pain.   multivitamin with minerals Tabs tablet Take 1 tablet by mouth daily.   omeprazole 20 MG tablet Commonly known as: PRILOSEC OTC Take 20 mg by mouth daily.        Allergies  Allergen Reactions   Penicillins     Unknown reaction (CHILDHOOD) Tolerated 1st generation cephalosporin (CEFAZOLIN) on 03/23/2015, 06/20/2015, 08/31/2020 with no documented ADRs.    Tizanidine Nausea Only   Cyclobenzaprine Nausea Only     Consultations:    Procedures/Studies: DG Chest 2 View  Result Date: 01/26/2021 CLINICAL DATA:  Fever.  Sepsis workup. EXAM: CHEST - 2 VIEW COMPARISON:  PA chest and rib series 01/03/2017 FINDINGS: The heart size and mediastinal contours are within normal limits. PA there is increased opacity in the pericardiac left base obscuring left heart border, concerning for a lingular infiltrate although not well localized on the lateral view. This was not seen previously. No pleural effusion is seen. The visualized skeletal structures are unremarkable. IMPRESSION: Findings concerning for a lingular infiltrate on the PA but is not well seen on the lateral view, alternative explanation would be interval scarring in the area. Electronically Signed   By: Telford Nab M.D.   On: 01/26/2021 06:46   CT ABDOMEN PELVIS W CONTRAST  Result Date: 01/26/2021 CLINICAL DATA:  Flank pain with kidney stone suspected. Pain at the right upper quadrant 2 right flank. EXAM: CT ABDOMEN AND PELVIS WITH CONTRAST TECHNIQUE: Multidetector CT imaging of the abdomen and pelvis was performed using the standard protocol following bolus administration of intravenous contrast. RADIATION DOSE REDUCTION: This exam was performed according to the departmental dose-optimization program which includes automated exposure control, adjustment of the mA and/or kV according to patient  size and/or use of iterative reconstruction technique. CONTRAST:  166m OMNIPAQUE IOHEXOL 300 MG/ML  SOLN COMPARISON:  None. FINDINGS: Lower chest:  Subpleural opacity most consistent with atelectasis. Hepatobiliary: Possible hepatic steatosis. No focal liver lesion.No evidence of biliary obstruction or stone. Pancreas: Unremarkable. Spleen: Unremarkable. Adrenals/Urinary Tract: 13 mm right adrenal nodule. No specific imaging features but stable from a 2015 lumbar MRI, most consistent with adenoma. No hydronephrosis or stone. Unremarkable bladder. Stomach/Bowel:  No  obstruction. No appendicitis. Vascular/Lymphatic: No acute vascular abnormality. No mass or adenopathy. Reproductive:No pathologic findings. Other: No ascites or pneumoperitoneum. Musculoskeletal: No acute abnormalities. IMPRESSION: 1. No acute finding. 2. Small right adrenal adenoma. Electronically Signed   By: JJorje GuildM.D.   On: 01/26/2021 05:26   [place]ABDOM[place] LIMITED RUQ (LIVER/GB)  Result Date: 01/26/2021 CLINICAL DATA:  Right upper quadrant abdominal pain. EXAM: ULTRASOUND ABDOMEN LIMITED RIGHT UPPER QUADRANT COMPARISON:  None. FINDINGS: Gallbladder: No gallstones or wall thickening visualized. No sonographic Murphy sign noted by sonographer. Common bile duct: Diameter: 5 mm Liver: No focal lesion identified. Within normal limits in parenchymal echogenicity. Portal vein is patent on color Doppler imaging with normal direction of blood flow towards the liver. Other: None. IMPRESSION: Unremarkable right upper quadrant ultrasound. Electronically Signed   By: AAnner CreteM.D.   On: 01/26/2021 02:43   (Echo, Carotid, EGD, Colonoscopy, ERCP)    Subjective: Pt denies any complaints    Discharge Exam: Vitals:   01/28/21 0432 01/28/21 0905  BP: 115/78 123/75  Pulse: 85 80  Resp: 20 16  Temp: (!) 97.4 F (36.3 C) 98 F (36.7 C)  SpO2: 98% 100%   Vitals:   01/27/21 1549 01/27/21 2005 01/28/21 0432 01/28/21 0905  BP: 101/69 103/72 115/78 123/75  Pulse: 80 78 85 80  Resp: 18 16 20 16   Temp: 98.3 F (36.8 C) 97.7 F (36.5 C) (!) 97.4 F (36.3 C) 98 F (36.7 C)  TempSrc: Oral Oral Oral   SpO2: 98% 98% 98% 100%  Weight:      Height:        General: Pt is alert, awake, not in acute distress Cardiovascular: S1/S2 +, no rubs, no gallops Respiratory: CTA bilaterally, no wheezing, no rhonchi Abdominal: Soft, NT, ND, bowel sounds + Extremities: no edema, no cyanosis    The results of significant diagnostics from this hospitalization (including imaging, microbiology, ancillary  and laboratory) are listed below for reference.     Microbiology: Recent Results (from the past 240 hour(s))  Resp Panel by RT-PCR (Flu A&B, Covid) Nasopharyngeal Swab     Status: None   Collection Time: 01/26/21 12:32 AM   Specimen: Nasopharyngeal Swab; Nasopharyngeal(NP) swabs in vial transport medium  Result Value Ref Range Status   SARS Coronavirus 2 by RT PCR NEGATIVE NEGATIVE Final    Comment: (NOTE) SARS-CoV-2 target nucleic acids are NOT DETECTED.  The SARS-CoV-2 RNA is generally detectable in upper respiratory specimens during the acute phase of infection. The lowest concentration of SARS-CoV-2 viral copies this assay can detect is 138 copies/mL. A negative result does not preclude SARS-Cov-2 infection and should not be used as the sole basis for treatment or other patient management decisions. A negative result may occur with  improper specimen collection/handling, submission of specimen other than nasopharyngeal swab, presence of viral mutation(s) within the areas targeted by this assay, and inadequate number of viral copies(<138 copies/mL). A negative result must be combined with clinical observations, patient history, and epidemiological information. The expected result is Negative.  Fact Sheet for Patients:  EntrepreneurPulse.com.au  Fact Sheet for Healthcare Providers:  IncredibleEmployment.be  This test is no t yet approved or cleared by the [place] FDA and  has been authorized for detection and/or diagnosis of SARS-CoV-2 by FDA under an Emergency Use Authorization (EUA). This EUA will remain  in effect (meaning this test can be used) for the duration of the COVID-19 declaration under Section 564(b)(1) of the Act, 21 U.S.C.section 360bbb-3(b)(1), unless the authorization is terminated  or revoked sooner.       Influenza A by PCR NEGATIVE NEGATIVE Final   Influenza B by PCR NEGATIVE NEGATIVE Final    Comment:  (NOTE) The Xpert Xpress SARS-CoV-2/FLU/RSV plus assay is intended as an aid in the diagnosis of influenza from Nasopharyngeal swab specimens and should not be used as a sole basis for treatment. Nasal washings and aspirates are unacceptable for Xpert Xpress SARS-CoV-2/FLU/RSV testing.  Fact Sheet for Patients: EntrepreneurPulse.com.au  Fact Sheet for Healthcare Providers: IncredibleEmployment.be  This test is not yet approved or cleared by the [place] FDA and has been authorized for detection and/or diagnosis of SARS-CoV-2 by FDA under an Emergency Use Authorization (EUA). This EUA will remain in effect (meaning this test can be used) for the duration of the COVID-19 declaration under Section 564(b)(1) of the Act, 21 U.S.C. section 360bbb-3(b)(1), unless the authorization is terminated or revoked.  Performed at [facility], [address], [place], [place] [address]   Urine Culture     Status: Abnormal   Collection Time: 01/26/21  2:15 AM   Specimen: Urine, Clean Catch  Result Value Ref Range Status   Specimen Description   Final    URINE, CLEAN CATCH Performed at [facility], [address] [place], [place] [address]    Special Requests   Final    NONE Performed at [facility], [address], [place], [place] [address]    Culture (A)  Final    <10,000 COLONIES/mL INSIGNIFICANT GROWTH Performed at [facility] [address], [place], [place] [address]    Report Status 01/27/2021 FINAL  Final  CULTURE, BLOOD (ROUTINE X 2) w Reflex to ID Panel     Status: None (Preliminary result)   Collection Time: 01/26/21  8:40 AM   Specimen: BLOOD  Result Value Ref Range Status   Specimen Description BLOOD BLOOD LEFT WRIST  Final   Special Requests   Final    BOTTLES DRAWN AEROBIC AND ANAEROBIC Blood Culture adequate volume   Culture   Final    NO GROWTH 2 DAYS Performed at [facility],  [address] [place], [place] [address]    Report Status PENDING  Incomplete  CULTURE, BLOOD (ROUTINE X 2) w Reflex to ID Panel     Status: None (Preliminary result)   Collection Time: 01/26/21  8:47 AM   Specimen: BLOOD  Result Value Ref Range Status   Specimen Description BLOOD LEFT ANTECUBITAL  Final   Special Requests   Final    BOTTLES DRAWN AEROBIC AND ANAEROBIC Blood Culture adequate volume   Culture   Final    NO GROWTH 2 DAYS Performed at [facility], [address] [place], [place] [address]    Report Status PENDING  Incomplete  Expectorated Sputum Assessment w Gram Stain, Rflx to Resp Cult     Status: None   Collection Time: 01/26/21  4:03 PM   Specimen: Expectorated Sputum  Result Value Ref Range Status  Specimen Description EXPECTORATED SPUTUM  Final   Special Requests NONE  Final   Sputum evaluation   Final    Sputum specimen not acceptable for testing.  Please recollect.   RESULT CALLED TO, READ BACK BY AND VERIFIED WITH: JASMYNE DAVIS @1915  ON 01/26/21 SKL Performed at [facility], [address] [place], [place] [address]    Report Status 01/26/2021 FINAL  Final     Labs: BNP (last 3 results) No results for input(s): BNP in the last 8760 hours. Basic Metabolic Panel: Recent Labs  Lab 01/26/21 0039 01/26/21 0833 01/27/21 0421 01/28/21 0539  NA 136  --  137 141  K 3.1*  --  3.7 3.3*  CL 104  --  110 110  CO2 21*  --  23 21*  GLUCOSE 114*  --  121* 119*  BUN 14  --  7 7  CREATININE 0.75  --  0.60 0.63  CALCIUM 8.8*  --  8.2* 8.9  MG  --  1.5* 2.1  --    Liver Function Tests: Recent Labs  Lab 01/26/21 0039  AST 19  ALT 17  ALKPHOS 53  BILITOT 0.9  PROT 6.8  ALBUMIN 4.4   Recent Labs  Lab 01/26/21 0039  LIPASE 37   No results for input(s): AMMONIA in the last 168 hours. CBC: Recent Labs  Lab 01/26/21 0039 01/27/21 0421 01/28/21 0539  WBC 13.5* 5.8 8.0  HGB 14.4 11.4* 11.3*  HCT 41.1 33.7* 34.3*  MCV  97.6 99.4 100.6*  PLT 248 205 219   Cardiac Enzymes: No results for input(s): CKTOTAL, CKMB, CKMBINDEX, TROPONINI in the last 168 hours. BNP: Invalid input(s): POCBNP CBG: Recent Labs  Lab 01/27/21 0847 01/28/21 0821  GLUCAP 110* 116*   D-Dimer No results for input(s): DDIMER in the last 72 hours. Hgb A1c No results for input(s): HGBA1C in the last 72 hours. Lipid Profile No results for input(s): CHOL, HDL, LDLCALC, TRIG, CHOLHDL, LDLDIRECT in the last 72 hours. Thyroid function studies No results for input(s): TSH, T4TOTAL, T3FREE, THYROIDAB in the last 72 hours.  Invalid input(s): FREET3 Anemia work up No results for input(s): VITAMINB12, FOLATE, FERRITIN, TIBC, IRON, RETICCTPCT in the last 72 hours. Urinalysis    Component Value Date/Time   COLORURINE YELLOW 01/26/2021 0215   COLORURINE YELLOW 01/26/2021 0215   APPEARANCEUR CLEAR 01/26/2021 0215   APPEARANCEUR CLEAR 01/26/2021 0215   LABSPEC 1.020 01/26/2021 0215   LABSPEC 1.020 01/26/2021 0215   PHURINE 5.0 01/26/2021 0215   PHURINE 5.0 01/26/2021 0215   GLUCOSEU NEGATIVE 01/26/2021 0215   GLUCOSEU NEGATIVE 01/26/2021 0215   HGBUR NEGATIVE 01/26/2021 0215   HGBUR NEGATIVE 01/26/2021 0215   BILIRUBINUR NEGATIVE 01/26/2021 0215   BILIRUBINUR NEGATIVE 01/26/2021 0215   KETONESUR TRACE (A) 01/26/2021 0215   KETONESUR TRACE (A) 01/26/2021 0215   PROTEINUR NEGATIVE 01/26/2021 0215   PROTEINUR NEGATIVE 01/26/2021 0215   NITRITE NEGATIVE 01/26/2021 0215   NITRITE NEGATIVE 01/26/2021 0215   LEUKOCYTESUR NEGATIVE 01/26/2021 0215   LEUKOCYTESUR NEGATIVE 01/26/2021 0215   Sepsis Labs Invalid input(s): PROCALCITONIN,  WBC,  LACTICIDVEN Microbiology Recent Results (from the past 240 hour(s))  Resp Panel by RT-PCR (Flu A&B, Covid) Nasopharyngeal Swab     Status: None   Collection Time: 01/26/21 12:32 AM   Specimen: Nasopharyngeal Swab; Nasopharyngeal(NP) swabs in vial transport medium  Result Value Ref Range Status    SARS Coronavirus 2 by RT PCR NEGATIVE NEGATIVE Final    Comment: (NOTE) SARS-CoV-2 target  nucleic acids are NOT DETECTED.  The SARS-CoV-2 RNA is generally detectable in upper respiratory specimens during the acute phase of infection. The lowest concentration of SARS-CoV-2 viral copies this assay can detect is 138 copies/mL. A negative result does not preclude SARS-Cov-2 infection and should not be used as the sole basis for treatment or other patient management decisions. A negative result may occur with  improper specimen collection/handling, submission of specimen other than nasopharyngeal swab, presence of viral mutation(s) within the areas targeted by this assay, and inadequate number of viral copies(<138 copies/mL). A negative result must be combined with clinical observations, patient history, and epidemiological information. The expected result is Negative.  Fact Sheet for Patients:  EntrepreneurPulse.com.au  Fact Sheet for Healthcare Providers:  IncredibleEmployment.be  This test is no t yet approved or cleared by the [place] FDA and  has been authorized for detection and/or diagnosis of SARS-CoV-2 by FDA under an Emergency Use Authorization (EUA). This EUA will remain  in effect (meaning this test can be used) for the duration of the COVID-19 declaration under Section 564(b)(1) of the Act, 21 U.S.C.section 360bbb-3(b)(1), unless the authorization is terminated  or revoked sooner.       Influenza A by PCR NEGATIVE NEGATIVE Final   Influenza B by PCR NEGATIVE NEGATIVE Final    Comment: (NOTE) The Xpert Xpress SARS-CoV-2/FLU/RSV plus assay is intended as an aid in the diagnosis of influenza from Nasopharyngeal swab specimens and should not be used as a sole basis for treatment. Nasal washings and aspirates are unacceptable for Xpert Xpress SARS-CoV-2/FLU/RSV testing.  Fact Sheet for  Patients: EntrepreneurPulse.com.au  Fact Sheet for Healthcare Providers: IncredibleEmployment.be  This test is not yet approved or cleared by the [place] FDA and has been authorized for detection and/or diagnosis of SARS-CoV-2 by FDA under an Emergency Use Authorization (EUA). This EUA will remain in effect (meaning this test can be used) for the duration of the COVID-19 declaration under Section 564(b)(1) of the Act, 21 U.S.C. section 360bbb-3(b)(1), unless the authorization is terminated or revoked.  Performed at [facility], [address], [place], [place] [address]   Urine Culture     Status: Abnormal   Collection Time: 01/26/21  2:15 AM   Specimen: Urine, Clean Catch  Result Value Ref Range Status   Specimen Description   Final    URINE, CLEAN CATCH Performed at [facility], [address], [place], [place] [address]    Special Requests   Final    NONE Performed at [facility], [address], [place], [place] [address]    Culture (A)  Final    <10,000 COLONIES/mL INSIGNIFICANT GROWTH Performed at [facility] [address], [place], [place] [address]    Report Status 01/27/2021 FINAL  Final  CULTURE, BLOOD (ROUTINE X 2) w Reflex to ID Panel     Status: None (Preliminary result)   Collection Time: 01/26/21  8:40 AM   Specimen: BLOOD  Result Value Ref Range Status   Specimen Description BLOOD BLOOD LEFT WRIST  Final   Special Requests   Final    BOTTLES DRAWN AEROBIC AND ANAEROBIC Blood Culture adequate volume   Culture   Final    NO GROWTH 2 DAYS Performed at [facility], [address], [place], [place] [address]    Report Status PENDING  Incomplete  CULTURE, BLOOD (ROUTINE X 2) w Reflex to ID Panel     Status: None (Preliminary result)  Collection Time: 01/26/21  8:47 AM   Specimen: BLOOD  Result Value Ref Range Status   Specimen Description BLOOD LEFT ANTECUBITAL   Final   Special Requests   Final    BOTTLES DRAWN AEROBIC AND ANAEROBIC Blood Culture adequate volume   Culture   Final    NO GROWTH 2 DAYS Performed at [facility], [address], [place], [place] [address]    Report Status PENDING  Incomplete  Expectorated Sputum Assessment w Gram Stain, Rflx to Resp Cult     Status: None   Collection Time: 01/26/21  4:03 PM   Specimen: Expectorated Sputum  Result Value Ref Range Status   Specimen Description EXPECTORATED SPUTUM  Final   Special Requests NONE  Final   Sputum evaluation   Final    Sputum specimen not acceptable for testing.  Please recollect.   RESULT CALLED TO, READ BACK BY AND VERIFIED WITH: JASMYNE DAVIS @1915  ON 01/26/21 SKL Performed at [facility], [address] [place], [place] [address]    Report Status 01/26/2021 FINAL  Final     Time coordinating discharge: Over 30 minutes  SIGNED:   Wyvonnia Dusky, MD  [facility] 01/28/2021, 11:11 AM Pager   If 7PM-7AM, please contact night-coverage

## 2021-01-28 NOTE — Progress Notes (Signed)
PHARMACIST - PHYSICIAN COMMUNICATION ° °CONCERNING: Antibiotic IV to Oral Route Change Policy ° °RECOMMENDATION: °This patient is receiving azithromycin by the intravenous route.  Based on criteria approved by the Pharmacy and Therapeutics Committee, the antibiotic(s) is/are being converted to the equivalent oral dose form(s). ° ° °DESCRIPTION: °These criteria include: °Patient being treated for a respiratory tract infection, urinary tract infection, cellulitis or clostridium difficile associated diarrhea if on metronidazole °The patient is not neutropenic and does not exhibit a GI malabsorption state °The patient is eating (either orally or via tube) and/or has been taking other orally administered medications for a least 24 hours °The patient is improving clinically and has a Tmax < 100.5 ° °If you have questions about this conversion, please contact the Pharmacy Department  ° °Analiyah Lechuga B Moranda Billiot  °01/28/21  °  °

## 2021-01-28 NOTE — Plan of Care (Signed)
°  Problem: Education: Goal: Knowledge of General Education information will improve Description: Including pain rating scale, medication(s)/side effects and non-pharmacologic comfort measures Outcome: Progressing   Problem: Health Behavior/Discharge Planning: Goal: Ability to manage health-related needs will improve Outcome: Progressing   Problem: Clinical Measurements: Goal: Ability to maintain clinical measurements within normal limits will improve Outcome: Progressing Goal: Respiratory complications will improve Outcome: Progressing   Problem: Activity: Goal: Risk for activity intolerance will decrease Outcome: Progressing   Problem: Nutrition: Goal: Adequate nutrition will be maintained Outcome: Progressing   Problem: Coping: Goal: Level of anxiety will decrease Outcome: Progressing   Problem: Elimination: Goal: Will not experience complications related to bowel motility Outcome: Progressing Goal: Will not experience complications related to urinary retention Outcome: Progressing   Problem: Pain Managment: Goal: General experience of comfort will improve Outcome: Progressing   Problem: Skin Integrity: Goal: Risk for impaired skin integrity will decrease Outcome: Progressing

## 2021-01-28 NOTE — TOC Transition Note (Signed)
Transition of Care [facility]) - CM/SW Discharge Note   Patient Details  Name: Margaret Gates MRN: [id number] Date of Birth: Apr 08, 1980  [facility] CM/SW Contact:  Izola Price, RN Phone Number: 01/28/2021, 12:24 PM   Clinical Narrative:  1/2 8: Discharge home/self care. No TOC needs identified in DC orders/plan. Simmie Davies RN CM     Final next level of care: Home/Self Care Barriers to Discharge: Barriers Resolved   Patient Goals and CMS Choice     Choice offered to / list presented to : NA  Discharge Placement                       Discharge Plan and Services                DME Arranged: N/A DME Agency: NA       HH Arranged: NA HH Agency: NA        Social Determinants of Health (SDOH) Interventions     Readmission Risk Interventions No flowsheet data found.

## 2021-01-31 LAB — CULTURE, BLOOD (ROUTINE X 2)
Culture: NO GROWTH
Culture: NO GROWTH
Special Requests: ADEQUATE
Special Requests: ADEQUATE

## 2021-02-02 ENCOUNTER — Ambulatory Visit: Payer: BC Managed Care – PPO | Attending: Surgery | Admitting: Occupational Therapy

## 2021-02-02 ENCOUNTER — Other Ambulatory Visit: Payer: Self-pay

## 2021-02-02 ENCOUNTER — Encounter: Payer: Self-pay | Admitting: Occupational Therapy

## 2021-02-02 DIAGNOSIS — M79641 Pain in right hand: Secondary | ICD-10-CM | POA: Insufficient documentation

## 2021-02-02 DIAGNOSIS — M79642 Pain in left hand: Secondary | ICD-10-CM | POA: Diagnosis present

## 2021-02-02 DIAGNOSIS — M25631 Stiffness of right wrist, not elsewhere classified: Secondary | ICD-10-CM | POA: Insufficient documentation

## 2021-02-02 DIAGNOSIS — M25632 Stiffness of left wrist, not elsewhere classified: Secondary | ICD-10-CM | POA: Diagnosis present

## 2021-02-02 DIAGNOSIS — M25531 Pain in right wrist: Secondary | ICD-10-CM | POA: Diagnosis present

## 2021-02-02 DIAGNOSIS — M25532 Pain in left wrist: Secondary | ICD-10-CM | POA: Insufficient documentation

## 2021-02-02 DIAGNOSIS — M6281 Muscle weakness (generalized): Secondary | ICD-10-CM | POA: Insufficient documentation

## 2021-02-02 NOTE — Therapy (Signed)
[place] [facility] [address] [place], [address] Phone: [phone]   Fax:  [phone]  Occupational Therapy Evaluation  Patient Details  Name: Margaret Gates MRN: [id number] Date of Birth: 1980-02-06 Referring Provider (OT): Dr Roland Rack   Encounter Date: 02/02/2021   OT End of Session - 02/02/21 1315     Visit Number 1    Number of Visits 12    Date for OT Re-Evaluation 03/16/21    OT Start Time 1200    OT Stop Time 1250    OT Time Calculation (min) 50 min    Activity Tolerance Patient tolerated treatment well    Behavior During Therapy [facility] for tasks assessed/performed             Past Medical History:  Diagnosis Date   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[place])    Asthma    Bronchitis    GERD (gastroesophageal reflux disease)    Headache    Tobacco use     Past Surgical History:  Procedure Laterality Date   CARPAL TUNNEL RELEASE Right 08/31/2020   Procedure: CARPAL TUNNEL RELEASE ENDOSCOPIC;  Surgeon: Corky Mull, MD;  Location: [facility] ORS;  Service: Orthopedics;  Laterality: Right;   CARPAL TUNNEL RELEASE Left 10/26/2020   Procedure: CARPAL TUNNEL RELEASE ENDOSCOPIC;  Surgeon: Corky Mull, MD;  Location: [facility] ORS;  Service: Orthopedics;  Laterality: Left;   CESAREAN SECTION     HIP SURGERY Bilateral    LAPROSCOPIC BONE SPURS    There were no vitals filed for this visit.   Subjective Assessment - 02/02/21 1304     Subjective  My L hand and wrist worst then my R  it feels like ti pulling and seperating at my wrist and palm when I push, pull or try and lift /grip objects. I cannot cut ,lift ,pull, push or turn things - and at my job I have to be able to pick up and handle more than 50lbs.  Pain today is 6/10 in my L and go up to 10/10 - and my R is a 8/10 when lifting    Pertinent History Pt was seen by Dr Roland Rack on 01/23/21 for follow up  12 weeks status post an endoscopic left carpal tunnel release (  10/26/20).  R CTR done 08/31/20. Overall, the patient feels that she is doing reasonably well. However, she continues to experience moderate pain in her  L wrist which she rates at 5/10 on today's visit, and for which she will take ibuprofen and/or Tylenol as necessary with temporary partial relief of her symptoms. She localizes the pain to the aspect of her hand. In addition, she will continue to wear her brace at night as she finds this to be helpful in enabling her to sleep. She still notes difficulty lifting objects as well as some residual paresthesias to her fingertips. She denies any reinjury to the wrist or hand, and denies any fevers or chills. She has not yet returned to work, but does not feel that she can do the work that would be required of her, such as lifting 50 pounds.  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 Yes  Pain Score 6    increat to 8 and 10 in my hands and wrist with lifting , carrying ,pushing and pulling   Pain Location Hand   volar wrists   Pain Orientation Right;Left    Pain Descriptors / Indicators Aching;Tightness;Cramping;Numbness    Pain Type Surgical pain    Pain Onset More than a month ago    Pain Frequency Constant               [facility] OT Assessment - 02/02/21 0001       Assessment   Medical Diagnosis Bilateral CTR- endoscopic    Referring Provider (OT) Dr Roland Rack    Onset Date/Surgical Date 10/26/20   R 08/31/20   Hand Dominance Right    Next MD Visit 3rd wk of March      Home  Environment   Lives With Family      Prior Function   Vocation Full time employment   [employer] full time   Leisure Work part time in afternoon  after school, yard and garden, small animals on farm      AROM   Right Wrist Extension 58 Degrees    Right Wrist Flexion 76  Degrees    Right Wrist Radial Deviation 12 Degrees    Right Wrist Ulnar Deviation 30 Degrees    Left Wrist Extension 65 Degrees    Left Wrist Flexion 82 Degrees    Left Wrist Radial Deviation 12 Degrees    Left Wrist Ulnar Deviation 35 Degrees      Strength   Right Hand Grip (lbs) 51    Right Hand Lateral Pinch 13 lbs    Right Hand 3 Point Pinch 9 lbs   pain at thumb   Left Hand Grip (lbs) 31   pain   Left Hand Lateral Pinch 11 lbs   pain   Left Hand 3 Point Pinch 11 lbs   pain     Right Hand AROM   R Thumb Opposition to Index --   opposition to base of 5th - pull over thumb     Left Hand AROM   L Thumb Opposition to Index --   opposition to base of 5th -pull over thumb           pt to use heat prior to her HEP  Paraffin done to bilateral hands prior to review of HEP and soft tissue mobs - pain decrease to 2/10  Soft tissue mobs done graston tool nr 2 sweeping over volar forearms -and MC spreads , webspace  Prior to wrist flexion, ext and RD, UD stretches  Scar massage done  Husband to help pt with soft tissue mob s    Tendon glides AROM  10 reps Opposition to 5th - slide down 5th stop when feeling pull - 5 reps  2 x day  Keep pain under 2/10  Built up handles and use larger joints to pick up or hold objectss                 OT Education - 02/02/21 1314     Education Details Findings of eval and HEP    Person(s) Educated Patient    Methods Explanation;Demonstration;Tactile cues;Verbal cues;Handout    Comprehension Verbal cues required;Returned demonstration;Verbalized understanding              OT Short Term Goals - 02/02/21 1405       OT SHORT TERM GOAL #1   Title Pt and husband to be independent  in HEP to decrease scar tissue and pain to less than 2/10  , and increase AROM    Baseline pain 8/10 R hand lifting, L 6-10/10 at rest to using - decrease wrist AROM and pain    Time 3    Period Weeks    Status New    Target Date 02/23/21                OT Long Term Goals - 02/02/21 1407       OT LONG TERM GOAL #1   Title Bilateral wrist AROM increase to WNL and pain free to push up from chair , push and pull door open    Baseline decrease wrist AROM in all planes and pain    Time 3    Period Weeks    Status New    Target Date 02/23/21      OT LONG TERM GOAL #2   Title Pt' s bilateral  grip and prehension strenght increase within her range for her age to carry more than 10 lbs, push , pull  heavy door  without symptoms    Baseline Grip R51, L 31 pain , Lat grip R 13, L11 pain , 3 poin pinch R 9 , L 11 lbs pain -    Time 6    Period Weeks    Status New    Target Date 03/16/21      OT LONG TERM GOAL #3   Title Pain on PRWHE  outcome measure improve with more than 20 points    Baseline PRWHE pain score 38/50 for hand and wrist pain    Time 6    Period Weeks    Status New    Target Date 03/16/21                   Plan - 02/02/21 1315     Clinical Impression Statement Pt refer to OT s/p L CTR endoscopy 10/26/20- , pt report she had in Aug 22 R CTR. Pt with increase scar tissue ,  and pain in bilateral wrist , palms and thumbs -as well as decrease wrist AROM with pain - increase pain with gripping and prehension and functional use  - L more than R. Pain in L hand at rest 6/10 and increase with use 10/10. R hand pain with use 8/10 per pt over palm and wrist - bothe hands limiting her functional use of hands in ADL's and IADL's - pt cannot  return to work  because she has to be able to handle more than 50 lbs with her hands. She do show pain and tenderness over bilateral 1st dorsal compartment and positive Finkelstein on the L - will cont to monitor. 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    OT Occupational Profile and History Problem Focused Assessment - Including review of records relating to presenting problem    Occupational performance deficits (Please refer to evaluation for details): ADL's;IADL's;Rest and Sleep;Work;Play;Leisure;Social Participation    Body Structure / Function / Physical Skills ADL;Strength;Pain;UE functional use;ROM;IADL;Scar mobility;Sensation;Decreased knowledge of precautions    Rehab Potential Good    Clinical Decision Making Limited treatment options, no task modification necessary  Comorbidities Affecting Occupational Performance: None    Modification or Assistance to Complete Evaluation  No modification of tasks or assist necessary to complete eval    OT Frequency 2x / week    OT Duration 6 weeks    OT Treatment/Interventions Self-care/ADL training;Paraffin;Ultrasound;Moist Heat;Fluidtherapy;Contrast Bath;Therapeutic exercise;Manual Therapy;Patient/family education;Passive range of motion;Scar mobilization;Therapeutic activities;Splinting;DME and/or AE instruction    Consulted and Agree with Plan of Care Patient             Patient will benefit from skilled therapeutic intervention in order to improve the following deficits and impairments:   Body Structure / Function / Physical Skills: ADL, Strength, Pain, UE functional use, ROM, IADL, Scar mobility, Sensation, Decreased knowledge of precautions       Visit Diagnosis: Pain in left hand - Plan: Ot plan of care cert/re-cert  Pain in left wrist - Plan: Ot plan of care cert/re-cert  Pain in right hand - Plan: Ot plan of care cert/re-cert  Pain in right wrist - Plan: Ot plan of care cert/re-cert  Stiffness of left wrist, not elsewhere classified - Plan: Ot plan of care cert/re-cert  Stiffness of right wrist, not elsewhere classified - Plan: Ot plan of care cert/re-cert  Muscle weakness (generalized) - Plan: Ot plan of care cert/re-cert    Problem  List Patient Active Problem List   Diagnosis Date Noted   Adrenal insufficiency ([place]) 01/26/2021   Nausea vomiting and diarrhea 01/26/2021   Severe sepsis ([place]) 01/26/2021   Hypokalemia 01/26/2021   Asthma 01/26/2021   GERD (gastroesophageal reflux disease) 01/26/2021   Tobacco use 01/26/2021   Hypomagnesemia 01/26/2021   CAP (community acquired pneumonia) 01/26/2021    Margaret Gates, Margaret Gates,,Margaret Gates 02/02/2021, 2:22 PM  [place] [facility] [address], [place], [address] Phone: [phone]   Fax:  [phone]  Name: Margaret Gates MRN: [id number] Date of Birth: [birth year]

## 2021-02-07 ENCOUNTER — Other Ambulatory Visit: Payer: Self-pay

## 2021-02-07 ENCOUNTER — Ambulatory Visit: Payer: BC Managed Care – PPO | Admitting: Occupational Therapy

## 2021-02-07 DIAGNOSIS — M79641 Pain in right hand: Secondary | ICD-10-CM

## 2021-02-07 DIAGNOSIS — M25631 Stiffness of right wrist, not elsewhere classified: Secondary | ICD-10-CM

## 2021-02-07 DIAGNOSIS — M79642 Pain in left hand: Secondary | ICD-10-CM

## 2021-02-07 DIAGNOSIS — M25632 Stiffness of left wrist, not elsewhere classified: Secondary | ICD-10-CM

## 2021-02-07 DIAGNOSIS — M25531 Pain in right wrist: Secondary | ICD-10-CM

## 2021-02-07 DIAGNOSIS — M6281 Muscle weakness (generalized): Secondary | ICD-10-CM

## 2021-02-07 DIAGNOSIS — M25532 Pain in left wrist: Secondary | ICD-10-CM

## 2021-02-07 NOTE — Therapy (Signed)
[place] [facility] [address] [place], [address] Phone: [phone]   Fax:  [phone]  Occupational Therapy Treatment  Patient Details  Name: Margaret Gates MRN: [id number] Date of Birth: 1980/11/15 Referring Provider (OT): Dr Roland Rack   Encounter Date: 02/07/2021   OT End of Session - 02/07/21 0921     Visit Number 2    Number of Visits 12    Date for OT Re-Evaluation 03/16/21    OT Start Time 0900    OT Stop Time 0947    OT Time Calculation (min) 47 min    Activity Tolerance Patient tolerated treatment well    Behavior During Therapy [facility] for tasks assessed/performed             Past Medical History:  Diagnosis Date   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[place])    Asthma    Bronchitis    GERD (gastroesophageal reflux disease)    Headache    Tobacco use     Past Surgical History:  Procedure Laterality Date   CARPAL TUNNEL RELEASE Right 08/31/2020   Procedure: CARPAL TUNNEL RELEASE ENDOSCOPIC;  Surgeon: Corky Mull, MD;  Location: [facility] ORS;  Service: Orthopedics;  Laterality: Right;   CARPAL TUNNEL RELEASE Left 10/26/2020   Procedure: CARPAL TUNNEL RELEASE ENDOSCOPIC;  Surgeon: Corky Mull, MD;  Location: [facility] ORS;  Service: Orthopedics;  Laterality: Left;   CESAREAN SECTION     HIP SURGERY Bilateral    LAPROSCOPIC BONE SPURS    There were no vitals filed for this visit.   Subjective Assessment - 02/07/21 0917     Subjective  I did get paraffin for my bath and done  it couple of times and husband did help with my massage  for hand and forearm - but hands are better - pain at the most now 4/10    Pertinent History Pt was seen by Dr Roland Rack on 01/23/21 for follow up  12 weeks status post an endoscopic left carpal tunnel release ( 10/26/20).  R CTR done 08/31/20. Overall, the patient feels that she is doing reasonably well. However, she continues to experience moderate pain in her  L wrist which she rates at  5/10 on today's visit, and for which she will take ibuprofen and/or Tylenol as necessary with temporary partial relief of her symptoms. She localizes the pain to the aspect of her hand. In addition, she will continue to wear her brace at night as she finds this to be helpful in enabling her to sleep. She still notes difficulty lifting objects as well as some residual paresthesias to her fingertips. She denies any reinjury to the wrist or hand, and denies any fevers or chills. She has not yet returned to work, but does not feel that she can do the work that would be required of her, such as lifting 50 pounds.  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 No/denies                [facility] OT Assessment - 02/07/21 0001       AROM   Right Wrist Extension 70 Degrees    Right Wrist Flexion 80 Degrees  Right Wrist Radial Deviation 18 Degrees    Right Wrist Ulnar Deviation 32 Degrees    Left Wrist Extension 70 Degrees    Left Wrist Flexion 85 Degrees    Left Wrist Radial Deviation 18 Degrees    Left Wrist Ulnar Deviation 35 Degrees      Strength   Right Hand Grip (lbs) 51    Right Hand Lateral Pinch 14 lbs   pain thumb   Right Hand 3 Point Pinch 10 lbs   pain   Left Hand Grip (lbs) 44   pain   Left Hand Lateral Pinch 15 lbs   pain   Left Hand 3 Point Pinch 11 lbs   pain            showed some great progress in wrist extention bilateral and grip /prehension -see flowsheet          OT Treatments/Exercises (OP) - 02/07/21 0001       RUE Paraffin   Number Minutes Paraffin 8 Minutes    RUE Paraffin Location Hand   wrist   Comments prior to soft tissue for  hand and wrist      LUE Paraffin   Number Minutes Paraffin 8 Minutes    LUE Paraffin Location Hand;Wrist    Comments  prior to soft tissue and ROM      LUE Contrast Bath   Time 8 minutes    Comments after soft tissue - decrease edema and pain             pt to use heat prior to her HEP - she got paraffin  But can do contrast if increase edema or pain over volar wrist after soft tissue  Paraffin done to bilateral hands prior soft tissue mobs  and scar massage  Soft tissue mobs done graston tool nr 2 sweeping over volar forearms -and MC spreads , webspace - review again with pt for husband to cont to do  wrist flexion, ext and RD, UD stretches done  Scar massage done     Tendon glides AROM  10 reps Opposition to 5th - slide down 5th stop when feeling pull - 5 reps  2 x day  Keep pain under 2/10  Built up handles and use larger joints to pick up or hold objectss       OT Education - 02/07/21 0921     Education Details progress and change to HEP    Person(s) Educated Patient    Methods Explanation;Demonstration;Tactile cues;Verbal cues;Handout    Comprehension Verbal cues required;Returned demonstration;Verbalized understanding              OT Short Term Goals - 02/02/21 1405       OT SHORT TERM GOAL #1   Title Pt and husband to be independent in HEP to decrease scar tissue and pain to less than 2/10  , and increase AROM    Baseline pain 8/10 R hand lifting, L 6-10/10 at rest to using - decrease wrist AROM and pain    Time 3    Period Weeks    Status New    Target Date 02/23/21               OT Long Term Goals - 02/02/21 1407       OT LONG TERM GOAL #1   Title Bilateral wrist AROM increase to WNL and pain free to push up from chair , push and pull door open    Baseline decrease wrist  AROM in all planes and pain    Time 3    Period Weeks    Status New    Target Date 02/23/21      OT LONG TERM GOAL #2   Title Pt' s bilateral  grip and prehension strenght increase within her range for her age to carry more than 10 lbs, push , pull  heavy door  without symptoms     Baseline Grip R51, L 31 pain , Lat grip R 13, L11 pain , 3 poin pinch R 9 , L 11 lbs pain -    Time 6    Period Weeks    Status New    Target Date 03/16/21      OT LONG TERM GOAL #3   Title Pain on PRWHE  outcome measure improve with more than 20 points    Baseline PRWHE pain score 38/50 for hand and wrist pain    Time 6    Period Weeks    Status New    Target Date 03/16/21                   Plan - 02/07/21 6761     Clinical Impression Statement Pt refer to OT s/p L CTR endoscopy 10/26/20- , pt report she had in Aug 22 R CTR.  At eval last week pt with increase scar tissue ,  and pain in bilateral wrist , palms and thumbs -as well as decrease wrist AROM with pain - increase pain with gripping and prehension and functional use  - L more than R. Pain in L hand at rest 6/10 and increase with use 10/10. R hand pain with use 8/10 per pt over palm and wrist - bothe hands limiting her functional use of hands in ADL's and IADL's - pt cannot  return to work  because she has to be able to handle more than 50 lbs with her hands.  THIS date pt show decrease pain to 4/10 at the worse , increase AROM for wrist and increase grip and prehension - still pain and tenderness  over L CT more than the R -and pain at thumbs with prehension strength - bilateral 1st dorsal compartment and positive Finkelstein on the L - will cont to monitor. 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    OT Occupational Profile and History Problem Focused Assessment - Including review of records relating to presenting problem    Occupational performance deficits (Please refer to evaluation for details): ADL's;IADL's;Rest and Sleep;Work;Play;Leisure;Social Participation    Body  Structure / Function / Physical Skills ADL;Strength;Pain;UE functional use;ROM;IADL;Scar mobility;Sensation;Decreased knowledge of precautions    Rehab Potential Good    Clinical Decision Making Limited treatment options, no task modification necessary    Comorbidities Affecting Occupational Performance: None    Modification or Assistance to Complete Evaluation  No modification of tasks or assist necessary to complete eval    OT Frequency 2x / week    OT Duration 6 weeks    OT Treatment/Interventions Self-care/ADL training;Paraffin;Ultrasound;Moist Heat;Fluidtherapy;Contrast Bath;Therapeutic exercise;Manual Therapy;Patient/family education;Passive range of motion;Scar mobilization;Therapeutic activities;Splinting;DME and/or AE instruction    Consulted and Agree with Plan of Care Patient  Patient will benefit from skilled therapeutic intervention in order to improve the following deficits and impairments:   Body Structure / Function / Physical Skills: ADL, Strength, Pain, UE functional use, ROM, IADL, Scar mobility, Sensation, Decreased knowledge of precautions       Visit Diagnosis: Pain in left hand  Pain in left wrist  Pain in right hand  Pain in right wrist  Stiffness of right wrist, not elsewhere classified  Muscle weakness (generalized)  Stiffness of left wrist, not elsewhere classified    Problem List Patient Active Problem List   Diagnosis Date Noted   Adrenal insufficiency ([place]) 01/26/2021   Nausea vomiting and diarrhea 01/26/2021   Severe sepsis ([place]) 01/26/2021   Hypokalemia 01/26/2021   Asthma 01/26/2021   GERD (gastroesophageal reflux disease) 01/26/2021   Tobacco use 01/26/2021   Hypomagnesemia 01/26/2021   CAP (community acquired pneumonia) 01/26/2021    Rosalyn Gess, OTR/L,CLT 02/07/2021, 11:54 AM  [place] [facility] [address] [place], [address] Phone:  [phone]   Fax:  [phone]  Name: Margaret Gates MRN: [id number] Date of Birth: May 26, 1980

## 2021-02-10 ENCOUNTER — Other Ambulatory Visit: Payer: Self-pay

## 2021-02-10 ENCOUNTER — Ambulatory Visit: Payer: BC Managed Care – PPO | Admitting: Occupational Therapy

## 2021-02-10 DIAGNOSIS — M25631 Stiffness of right wrist, not elsewhere classified: Secondary | ICD-10-CM

## 2021-02-10 DIAGNOSIS — M79642 Pain in left hand: Secondary | ICD-10-CM

## 2021-02-10 DIAGNOSIS — M25532 Pain in left wrist: Secondary | ICD-10-CM

## 2021-02-10 DIAGNOSIS — M79641 Pain in right hand: Secondary | ICD-10-CM

## 2021-02-10 DIAGNOSIS — M25632 Stiffness of left wrist, not elsewhere classified: Secondary | ICD-10-CM

## 2021-02-10 DIAGNOSIS — M6281 Muscle weakness (generalized): Secondary | ICD-10-CM

## 2021-02-10 DIAGNOSIS — M25531 Pain in right wrist: Secondary | ICD-10-CM

## 2021-02-10 NOTE — Therapy (Signed)
[facility] [address], [place], [address] Phone: [phone]   Fax:  [phone]  Occupational Therapy Treatment  Patient Details  Name: Margaret Gates MRN: [id number] Date of Birth: June 04, 1980 Referring Provider (OT): Dr Roland Rack   Encounter Date: 02/10/2021   OT End of Session - 02/10/21 1004     Visit Number 3    Number of Visits 12    Date for OT Re-Evaluation 03/16/21    OT Start Time 1004    OT Stop Time 1045    OT Time Calculation (min) 41 min    Activity Tolerance Patient tolerated treatment well    Behavior During Therapy [facility] for tasks assessed/performed             Past Medical History:  Diagnosis Date   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[place])    Asthma    Bronchitis    GERD (gastroesophageal reflux disease)    Headache    Tobacco use     Past Surgical History:  Procedure Laterality Date   CARPAL TUNNEL RELEASE Right 08/31/2020   Procedure: CARPAL TUNNEL RELEASE ENDOSCOPIC;  Surgeon: Corky Mull, MD;  Location: [facility] ORS;  Service: Orthopedics;  Laterality: Right;   CARPAL TUNNEL RELEASE Left 10/26/2020   Procedure: CARPAL TUNNEL RELEASE ENDOSCOPIC;  Surgeon: Corky Mull, MD;  Location: [facility] ORS;  Service: Orthopedics;  Laterality: Left;   CESAREAN SECTION     HIP SURGERY Bilateral    LAPROSCOPIC BONE SPURS    There were no vitals filed for this visit.   Subjective Assessment - 02/10/21 1004     Subjective  Doing okay - my L wrist over the scar did swell up after my husband done the massage - but improving - I just need to be able to pick up 50 lbs for work without pain    Pertinent History Pt was seen by Dr Roland Rack on 01/23/21 for follow up  12 weeks status post an endoscopic left carpal tunnel release ( 10/26/20).  R CTR done 08/31/20. Overall, the patient feels that she is doing reasonably well. However, she continues to experience moderate pain in her  L wrist which she rates at 5/10  on today's visit, and for which she will take ibuprofen and/or Tylenol as necessary with temporary partial relief of her symptoms. She localizes the pain to the aspect of her hand. In addition, she will continue to wear her brace at night as she finds this to be helpful in enabling her to sleep. She still notes difficulty lifting objects as well as some residual paresthesias to her fingertips. She denies any reinjury to the wrist or hand, and denies any fevers or chills. She has not yet returned to work, but does not feel that she can do the work that would be required of her, such as lifting 50 pounds.  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 Yes    Pain Score 2     Pain Location Hand    Pain Orientation Right;Left    Pain Descriptors / Indicators Aching;Tightness    Pain Type Surgical pain    Pain Onset More than a month ago  Pain Frequency Intermittent                [facility] OT Assessment - 02/10/21 0001       AROM   Right Wrist Extension 68 Degrees    Right Wrist Flexion 90 Degrees    Right Wrist Radial Deviation 18 Degrees    Right Wrist Ulnar Deviation 32 Degrees    Left Wrist Extension 75 Degrees    Left Wrist Flexion 80 Degrees    Left Wrist Radial Deviation 18 Degrees    Left Wrist Ulnar Deviation 35 Degrees      Strength   Right Hand Grip (lbs) 51   pain volar wrist   Right Hand Lateral Pinch 19 lbs    Right Hand 3 Point Pinch 12 lbs   pain thumb   Left Hand Grip (lbs) 53    Left Hand Lateral Pinch 15 lbs   pain thumb   Left Hand 3 Point Pinch 13 lbs   pain thumb     Right Hand AROM   R Thumb Opposition to Index --   opposition to base of 5th     Left Hand AROM   L Thumb Opposition to Index --   Opposition to base of 5th - pull               Great  progress again in L hand grip  and then bilateral prehension -still pain in thumb s And wrist on the R - ulnar wrist  And over incision with grip and wrist extention on L       OT Treatments/Exercises (OP) - 02/10/21 0001       RUE Fluidotherapy   Number Minutes Fluidotherapy 10 Minutes    RUE Fluidotherapy Location Hand;Wrist    Comments AROM in all planes      LUE Contrast Bath   Time 10 minutes    Comments after one coating of paraffin -  switch to contrast   prior to soft tissue              pt to use heat prior to her HEP - she got paraffin  But can do contrast if increase edema or pain over volar wrist after soft tissue  or prior  Soft tissue mobs done graston tool nr 2 sweeping over volar forearms  lightly this date-and MC spreads , webspace -  Husband to hold off on forearm massage for weekend  AAROM and light stretches by OT for wrist flexion, ext and RD, UD   Scar massage done      Tendon glides AROM  10 reps Opposition to 5th - slide down 5th stop when feeling pull - 5 reps  2 x day  Thumb PA and RA AAROM  Keep pain under 2/10  Built up handles and use larger joints to pick up or hold objectss         OT Education - 02/10/21 1004     Education Details progress and change to HEP    Person(s) Educated Patient    Methods Explanation;Demonstration;Tactile cues;Verbal cues;Handout    Comprehension Verbal cues required;Returned demonstration;Verbalized understanding              OT Short Term Goals - 02/02/21 1405       OT SHORT TERM GOAL #1   Title Pt and husband to be independent in HEP to decrease scar tissue and pain to less than 2/10  , and increase AROM    Baseline pain  8/10 R hand lifting, L 6-10/10 at rest to using - decrease wrist AROM and pain    Time 3    Period Weeks    Status New    Target Date 02/23/21               OT Long Term Goals - 02/02/21 1407       OT LONG TERM GOAL #1   Title Bilateral wrist AROM increase to  WNL and pain free to push up from chair , push and pull door open    Baseline decrease wrist AROM in all planes and pain    Time 3    Period Weeks    Status New    Target Date 02/23/21      OT LONG TERM GOAL #2   Title Pt' s bilateral  grip and prehension strenght increase within her range for her age to carry more than 10 lbs, push , pull  heavy door  without symptoms    Baseline Grip R51, L 31 pain , Lat grip R 13, L11 pain , 3 poin pinch R 9 , L 11 lbs pain -    Time 6    Period Weeks    Status New    Target Date 03/16/21      OT LONG TERM GOAL #3   Title Pain on PRWHE  outcome measure improve with more than 20 points    Baseline PRWHE pain score 38/50 for hand and wrist pain    Time 6    Period Weeks    Status New    Target Date 03/16/21                   Plan - 02/10/21 1004     Clinical Impression Statement Pt refer to OT s/p L CTR endoscopy 10/26/20- , pt report she had in Aug 22 R CTR.  At eval last week pt with increase scar tissue ,  and pain in bilateral wrist , palms and thumbs -as well as decrease wrist AROM with pain - increase pain with gripping and prehension and functional use  - L more than R. Pain in L hand at rest 6/10 and increase with use 10/10. R hand pain with use 8/10 per pt over palm and wrist - bothe hands limiting her functional use of hands in ADL's and IADL's - pt cannot  return to work  because she has to be able to handle more than 50 lbs with her hands.  NOW THIS DATE  pt show  great progress in AROM for wrists and grip and prehension strength in bilateral hands - L more than the R , pain decrease to 2-4/10 at the worse, - still pain and tenderness  over L CT more than the R -and pain at thumbs with prehension strength - L 1st dorsal compartment  tenderness  continues and positive Finkelstein on the L - will cont to monitor. 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    OT Occupational Profile and History Problem Focused Assessment - Including review of records relating to presenting problem    Occupational performance deficits (Please refer to evaluation for details): ADL's;IADL's;Rest and Sleep;Work;Play;Leisure;Social Participation  Body Structure / Function / Physical Skills ADL;Strength;Pain;UE functional use;ROM;IADL;Scar mobility;Sensation;Decreased knowledge of precautions    Rehab Potential Good    Clinical Decision Making Limited treatment options, no task modification necessary    Comorbidities Affecting Occupational Performance: None    Modification or Assistance to Complete Evaluation  No modification of tasks or assist necessary to complete eval    OT Frequency 2x / week    OT Duration 6 weeks    OT Treatment/Interventions Self-care/ADL training;Paraffin;Ultrasound;Moist Heat;Fluidtherapy;Contrast Bath;Therapeutic exercise;Manual Therapy;Patient/family education;Passive range of motion;Scar mobilization;Therapeutic activities;Splinting;DME and/or AE instruction    Consulted and Agree with Plan of Care Patient             Patient will benefit from skilled therapeutic intervention in order to improve the following deficits and impairments:   Body Structure / Function / Physical Skills: ADL, Strength, Pain, UE functional use, ROM, IADL, Scar mobility, Sensation, Decreased knowledge of precautions       Visit Diagnosis: Pain in left hand  Pain in left wrist  Pain in right hand  Pain in right wrist  Stiffness of right wrist, not elsewhere classified  Muscle weakness (generalized)  Stiffness of left wrist, not elsewhere classified    Problem List Patient Active Problem List   Diagnosis Date Noted   Adrenal insufficiency ([place]) 01/26/2021   Nausea  vomiting and diarrhea 01/26/2021   Severe sepsis ([place]) 01/26/2021   Hypokalemia 01/26/2021   Asthma 01/26/2021   GERD (gastroesophageal reflux disease) 01/26/2021   Tobacco use 01/26/2021   Hypomagnesemia 01/26/2021   CAP (community acquired pneumonia) 01/26/2021    Rosalyn Gess, OTR/L,CLT 02/10/2021, 1:22 PM  [facility] [address] [place], [address] Phone: [phone]   Fax:  [phone]  Name: Margaret Gates MRN: [id number] Date of Birth: [birth year]

## 2021-02-14 ENCOUNTER — Other Ambulatory Visit: Payer: Self-pay

## 2021-02-14 ENCOUNTER — Ambulatory Visit: Payer: BC Managed Care – PPO | Admitting: Occupational Therapy

## 2021-02-14 DIAGNOSIS — M25531 Pain in right wrist: Secondary | ICD-10-CM

## 2021-02-14 DIAGNOSIS — M79642 Pain in left hand: Secondary | ICD-10-CM | POA: Diagnosis not present

## 2021-02-14 DIAGNOSIS — M6281 Muscle weakness (generalized): Secondary | ICD-10-CM

## 2021-02-14 DIAGNOSIS — M25631 Stiffness of right wrist, not elsewhere classified: Secondary | ICD-10-CM

## 2021-02-14 DIAGNOSIS — M79641 Pain in right hand: Secondary | ICD-10-CM

## 2021-02-14 DIAGNOSIS — M25532 Pain in left wrist: Secondary | ICD-10-CM

## 2021-02-14 NOTE — Therapy (Signed)
[facility] [address], [place], [address] Phone: [phone]   Fax:  [phone]  Occupational Therapy Treatment  Patient Details  Name: Margaret Gates MRN: [id number] Date of Birth: Nov 07, 1980 Referring Provider (OT): Dr Roland Rack   Encounter Date: 02/14/2021   OT End of Session - 02/14/21 1003     Visit Number 4    Number of Visits 12    Date for OT Re-Evaluation 03/16/21    OT Start Time 0945    OT Stop Time 1025    OT Time Calculation (min) 40 min    Activity Tolerance Patient tolerated treatment well    Behavior During Therapy [facility] for tasks assessed/performed             Past Medical History:  Diagnosis Date   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[facility])    Asthma    Bronchitis    GERD (gastroesophageal reflux disease)    Headache    Tobacco use     Past Surgical History:  Procedure Laterality Date   CARPAL TUNNEL RELEASE Right 08/31/2020   Procedure: CARPAL TUNNEL RELEASE ENDOSCOPIC;  Surgeon: Corky Mull, MD;  Location: [facility] ORS;  Service: Orthopedics;  Laterality: Right;   CARPAL TUNNEL RELEASE Left 10/26/2020   Procedure: CARPAL TUNNEL RELEASE ENDOSCOPIC;  Surgeon: Corky Mull, MD;  Location: [facility] ORS;  Service: Orthopedics;  Laterality: Left;   CESAREAN SECTION     HIP SURGERY Bilateral    LAPROSCOPIC BONE SPURS    There were no vitals filed for this visit.   Subjective Assessment - 02/14/21 1001     Subjective  Lifting is still issues, tightness in the L thumb and wrist - but getting better - splints helping at night time -L pinkie was going numb    Pertinent History Pt was seen by Dr Roland Rack on 01/23/21 for follow up  12 weeks status post an endoscopic left carpal tunnel release ( 10/26/20).  R CTR done 08/31/20. Overall, the patient feels that she is doing reasonably well. However, she continues to experience moderate pain in her  L wrist which she rates at 5/10 on today's visit, and for  which she will take ibuprofen and/or Tylenol as necessary with temporary partial relief of her symptoms. She localizes the pain to the aspect of her hand. In addition, she will continue to wear her brace at night as she finds this to be helpful in enabling her to sleep. She still notes difficulty lifting objects as well as some residual paresthesias to her fingertips. She denies any reinjury to the wrist or hand, and denies any fevers or chills. She has not yet returned to work, but does not feel that she can do the work that would be required of her, such as lifting 50 pounds.  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 No/denies                [facility] OT Assessment - 02/14/21 0001       AROM   Right Wrist Extension 73 Degrees    Left Wrist Flexion 90 Degrees  OT Treatments/Exercises (OP) - 02/14/21 0001       RUE Paraffin   Number Minutes Paraffin 8 Minutes    RUE Paraffin Location Hand    Comments prior to soft tissue most      LUE Paraffin   Number Minutes Paraffin 8 Minutes    LUE Paraffin Location Hand;Wrist    Comments prior to soft tissue             pt to use heat prior to her HEP - she got paraffin  But can do contrast if increase edema or pain over volar wrist after soft tissue  or prior  Soft tissue mobs done- manual by OT volar forearms  and MC spreads , webspace -  Husband to hold off on forearm massage   AAROM and light stretches by OT for wrist flexion, ext and RD, UD   Scar massage done this  date and review again with pt for L      Tendon glides AROM  10 reps Opposition to 5th - slide down 5th stop when feeling pull - 5 reps  2 x day  Thumb PA and RA AAROM  Keep pain under 2/10  Built up handles and use larger joints  to pick up or hold objects Initiate 1 lbs or 16 oz hammer for R wrist in all planes and sup/pro - pain free - done 2 sets  Can do at home 2 sets of 12 reps 1 x day for R only          OT Education - 02/14/21 1003     Education Details progress and change to HEP    Person(s) Educated Patient    Methods Explanation;Demonstration;Tactile cues;Verbal cues;Handout    Comprehension Verbal cues required;Returned demonstration;Verbalized understanding              OT Short Term Goals - 02/02/21 1405       OT SHORT TERM GOAL #1   Title Pt and husband to be independent in HEP to decrease scar tissue and pain to less than 2/10  , and increase AROM    Baseline pain 8/10 R hand lifting, L 6-10/10 at rest to using - decrease wrist AROM and pain    Time 3    Period Weeks    Status New    Target Date 02/23/21               OT Long Term Goals - 02/02/21 1407       OT LONG TERM GOAL #1   Title Bilateral wrist AROM increase to WNL and pain free to push up from chair , push and pull door open    Baseline decrease wrist AROM in all planes and pain    Time 3    Period Weeks    Status New    Target Date 02/23/21      OT LONG TERM GOAL #2   Title Pt' s bilateral  grip and prehension strenght increase within her range for her age to carry more than 10 lbs, push , pull  heavy door  without symptoms    Baseline Grip R51, L 31 pain , Lat grip R 13, L11 pain , 3 poin pinch R 9 , L 11 lbs pain -    Time 6    Period Weeks    Status New    Target Date 03/16/21      OT LONG TERM GOAL #3   Title Pain on PRWHE  outcome measure improve with more than 20 points    Baseline PRWHE pain score 38/50 for hand and wrist pain    Time 6    Period Weeks    Status New    Target Date 03/16/21                   Plan - 02/14/21 1004     Clinical Impression Statement Pt refer to OT s/p L CTR endoscopy 10/26/20- , pt report she had in Aug 22 R CTR.  At eval had increase scar tissue ,  and  pain in bilateral wrist , palms and thumbs -as well as decrease wrist AROM with pain - increase pain with gripping and prehension and functional use  - L more than R. Pain in L hand at rest 6/10 and increase with use 10/10. R hand pain with use 8/10 per pt over palm and wrist - bothe hands limiting her functional use of hands in ADL's and IADL's - pt cannot  return to work  because she has to be able to handle more than 50 lbs with her hands.  NOW THIS DATE  pt show  great progress in AROM for wrists and grip and prehension strength in bilateral hands - L more than the R , pain decrease to 2-4/10 at the worse mostly in L thum, - still pain and tenderness  over L CT more than the R. Tenderness over 1 st dorsal compartment  improving as well as positive Finkelstein on the L decrease to more of pull  - will cont to monitor. 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    OT Occupational Profile and History Problem Focused Assessment - Including review of records relating to presenting problem    Occupational performance deficits (Please refer to evaluation for details): ADL's;IADL's;Rest and Sleep;Work;Play;Leisure;Social Participation    Body Structure / Function / Physical Skills ADL;Strength;Pain;UE functional use;ROM;IADL;Scar mobility;Sensation;Decreased knowledge of precautions    Rehab Potential Good    Clinical Decision Making Limited treatment options, no task modification necessary    Comorbidities Affecting Occupational Performance: None    Modification or Assistance to Complete Evaluation  No modification of tasks or assist necessary to complete eval    OT Frequency 2x / week    OT Duration 6 weeks    OT Treatment/Interventions Self-care/ADL  training;Paraffin;Ultrasound;Moist Heat;Fluidtherapy;Contrast Bath;Therapeutic exercise;Manual Therapy;Patient/family education;Passive range of motion;Scar mobilization;Therapeutic activities;Splinting;DME and/or AE instruction    Consulted and Agree with Plan of Care Patient             Patient will benefit from skilled therapeutic intervention in order to improve the following deficits and impairments:   Body Structure / Function / Physical Skills: ADL, Strength, Pain, UE functional use, ROM, IADL, Scar mobility, Sensation, Decreased knowledge of precautions       Visit Diagnosis: Pain in left hand  Pain in left wrist  Pain in right hand  Pain in right wrist  Stiffness of right wrist, not elsewhere classified  Muscle weakness (generalized)    Problem List Patient Active Problem List   Diagnosis Date Noted   Adrenal insufficiency ([place]) 01/26/2021   Nausea vomiting and diarrhea 01/26/2021   Severe sepsis ([place]) 01/26/2021  Hypokalemia 01/26/2021   Asthma 01/26/2021   GERD (gastroesophageal reflux disease) 01/26/2021   Tobacco use 01/26/2021   Hypomagnesemia 01/26/2021   CAP (community acquired pneumonia) 01/26/2021    Rosalyn Gess, OTR/L,CLT 02/14/2021, 10:29 AM  [facility] [address], [place], [address] Phone: [phone]   Fax:  [phone]  Name: Margaret Gates MRN: [id number] Date of Birth: 11/25/1980

## 2021-02-16 ENCOUNTER — Other Ambulatory Visit: Payer: Self-pay

## 2021-02-16 ENCOUNTER — Ambulatory Visit: Payer: BC Managed Care – PPO | Admitting: Occupational Therapy

## 2021-02-16 DIAGNOSIS — M79641 Pain in right hand: Secondary | ICD-10-CM

## 2021-02-16 DIAGNOSIS — M25531 Pain in right wrist: Secondary | ICD-10-CM

## 2021-02-16 DIAGNOSIS — M79642 Pain in left hand: Secondary | ICD-10-CM

## 2021-02-16 DIAGNOSIS — M25532 Pain in left wrist: Secondary | ICD-10-CM

## 2021-02-16 DIAGNOSIS — M25632 Stiffness of left wrist, not elsewhere classified: Secondary | ICD-10-CM

## 2021-02-16 DIAGNOSIS — M6281 Muscle weakness (generalized): Secondary | ICD-10-CM

## 2021-02-16 DIAGNOSIS — M25631 Stiffness of right wrist, not elsewhere classified: Secondary | ICD-10-CM

## 2021-02-16 NOTE — Therapy (Signed)
[facility] [address] [place], [address] Phone: [phone]   Fax:  [phone]  Occupational Therapy Treatment  Patient Details  Name: Margaret Gates MRN: [id number] Date of Birth: Oct 23, 1980 Referring Provider (OT): Dr Roland Rack   Encounter Date: 02/16/2021   OT End of Session - 02/16/21 0947     Visit Number 5    Number of Visits 12    Date for OT Re-Evaluation 03/16/21    OT Start Time 0947    OT Stop Time 1032    OT Time Calculation (min) 45 min    Activity Tolerance Patient tolerated treatment well    Behavior During Therapy [facility] for tasks assessed/performed             Past Medical History:  Diagnosis Date   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[place])    Asthma    Bronchitis    GERD (gastroesophageal reflux disease)    Headache    Tobacco use     Past Surgical History:  Procedure Laterality Date   CARPAL TUNNEL RELEASE Right 08/31/2020   Procedure: CARPAL TUNNEL RELEASE ENDOSCOPIC;  Surgeon: Corky Mull, MD;  Location: [facility] ORS;  Service: Orthopedics;  Laterality: Right;   CARPAL TUNNEL RELEASE Left 10/26/2020   Procedure: CARPAL TUNNEL RELEASE ENDOSCOPIC;  Surgeon: Corky Mull, MD;  Location: [facility] ORS;  Service: Orthopedics;  Laterality: Left;   CESAREAN SECTION     HIP SURGERY Bilateral    LAPROSCOPIC BONE SPURS    There were no vitals filed for this visit.   Subjective Assessment - 02/16/21 0947     Subjective  My L thumb hurts some today - I try and use my hands but weak from not using them since August- and palms are tender Jeoffrey Massed as well as thumbs    Pertinent History Pt was seen by Dr Roland Rack on 01/23/21 for follow up  12 weeks status post an endoscopic left carpal tunnel release ( 10/26/20).  R CTR done 08/31/20. Overall, the patient feels that she is doing reasonably well. However, she continues to experience moderate pain in her  L wrist which she rates at 5/10 on today's visit, and for  which she will take ibuprofen and/or Tylenol as necessary with temporary partial relief of her symptoms. She localizes the pain to the aspect of her hand. In addition, she will continue to wear her brace at night as she finds this to be helpful in enabling her to sleep. She still notes difficulty lifting objects as well as some residual paresthesias to her fingertips. She denies any reinjury to the wrist or hand, and denies any fevers or chills. She has not yet returned to work, but does not feel that she can do the work that would be required of her, such as lifting 50 pounds.  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 Yes    Pain Score 7     Pain Location Hand    Pain Orientation Right;Left    Pain Descriptors / Indicators Aching;Tender;Tightness    Pain Type Surgical pain    Pain Onset More than a month ago  [facility] OT Assessment - 02/16/21 0001       Strength   Right Hand Grip (lbs) 75    Right Hand Lateral Pinch 19 lbs    Right Hand 3 Point Pinch 14 lbs    Left Hand Grip (lbs) 55    Left Hand Lateral Pinch 16 lbs    Left Hand 3 Point Pinch 12 lbs             pt hard time and pain in thumbs and palms when pulling and pushing heavy door- need to use body  And then could carry 8 lbs on R and L 5 lbs -but  had pain in thumbs and forearm on radial  side L more than R  Attempted 5 lbs on the R for upper body strengthening and 2 lbs on the L -but had pain and discomfort in upper shoulders, pect and upper traps Decrease to 3 lbs on the R and L 1 lbs - pt to do 1 x day 12 reps elbow flexion, ext, shoulder punches and scapula retraction    Increase pain with grip and 3 point pinch - fitted with [facility] neoprene splint to use with functional activities that cause pain in  thumbs or in palms        OT Treatments/Exercises (OP) - 02/16/21 0001       RUE Paraffin   Number Minutes Paraffin 8 Minutes    RUE Paraffin Location Hand   wrist   Comments prior to soft tissue      LUE Paraffin   Number Minutes Paraffin 8 Minutes    LUE Paraffin Location Hand;Wrist    Comments prior to soft tissue and ROM            pt to use heat prior to her HEP - she got paraffin  But can do contrast if increase edema or pain over volar wrist after soft tissue  or prior  Soft tissue mobs done-  MC spreads , webspace -  Tight and tender in web spaces  AAROM and light stretches by OT for wrist flexion, ext  Scar massage done this  date and review again with pt for L more than R      Tendon glides AROM  10 reps Opposition to 5th - slide down 5th stop when feeling pull - 5 reps  2 x day  Thumb PA and RA AAROM  Keep pain under 2/10  Built up handles and use larger joints to pick up or hold objects        OT Education - 02/16/21 0947     Education Details progress and change to HEP    Person(s) Educated Patient    Methods Explanation;Demonstration;Tactile cues;Verbal cues;Handout    Comprehension Verbal cues required;Returned demonstration;Verbalized understanding              OT Short Term Goals - 02/02/21 1405       OT SHORT TERM GOAL #1   Title Pt and husband to be independent in HEP to decrease scar tissue and pain to less than 2/10  , and increase AROM    Baseline pain 8/10 R hand lifting, L 6-10/10 at rest to using - decrease wrist AROM and pain    Time 3    Period Weeks    Status New    Target Date 02/23/21               OT Long Term Goals - 02/02/21 1407  OT LONG TERM GOAL #1   Title Bilateral wrist AROM increase to WNL and pain free to push up from chair , push and pull door open    Baseline decrease wrist AROM in all planes and pain    Time 3    Period Weeks    Status New    Target Date 02/23/21      OT LONG TERM  GOAL #2   Title Pt' s bilateral  grip and prehension strenght increase within her range for her age to carry more than 10 lbs, push , pull  heavy door  without symptoms    Baseline Grip R51, L 31 pain , Lat grip R 13, L11 pain , 3 poin pinch R 9 , L 11 lbs pain -    Time 6    Period Weeks    Status New    Target Date 03/16/21      OT LONG TERM GOAL #3   Title Pain on PRWHE  outcome measure improve with more than 20 points    Baseline PRWHE pain score 38/50 for hand and wrist pain    Time 6    Period Weeks    Status New    Target Date 03/16/21                   Plan - 02/16/21 0947     Clinical Impression Statement Pt refer to OT s/p L CTR endoscopy 10/26/20- , pt report she had in Aug 22 R CTR.  At eval  had increase scar tissue , pain in bilateral wrist , palms and thumb 6-10/10 with decrease AROM and grip/prehension. Pt report  she has to be able to handle more than 50 lbs with her hands at work.  Great progress to this date in R hand more than L hand , AROM for bilateral wrist and digits WNL including opposition -but pain to base of 5th, Great progress in grip  and prephension R more than L - do have pain with grip in palm and 3 point pain at thumbs. Still pain and tenderness  over L CT more than the R. Tenderness over 1 st dorsal compartments are improving as well as positive Finkelstein on the L decrease to more of pull now-still tight and tender in bilateral webspace - recommend dryneedling by PT - will contact MD for v/o. Assess pt strength this date - could pick up and carry about 3-5 lbs in L , 8 lbs on R - pull over thumbs and palms.   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    OT  Occupational Profile and History Problem Focused Assessment - Including review of records relating to presenting problem    Occupational performance deficits (Please refer to evaluation for details): ADL's;IADL's;Rest and Sleep;Work;Play;Leisure;Social Participation    Body Structure / Function / Physical Skills ADL;Strength;Pain;UE functional use;ROM;IADL;Scar mobility;Sensation;Decreased knowledge of precautions    Rehab Potential Good    Clinical Decision Making Limited treatment options, no task modification necessary    Comorbidities Affecting Occupational Performance: None    Modification or Assistance to Complete Evaluation  No modification of tasks or assist  necessary to complete eval    OT Frequency 2x / week    OT Duration 6 weeks    OT Treatment/Interventions Self-care/ADL training;Paraffin;Ultrasound;Moist Heat;Fluidtherapy;Contrast Bath;Therapeutic exercise;Manual Therapy;Patient/family education;Passive range of motion;Scar mobilization;Therapeutic activities;Splinting;DME and/or AE instruction    Consulted and Agree with Plan of Care Patient             Patient will benefit from skilled therapeutic intervention in order to improve the following deficits and impairments:   Body Structure / Function / Physical Skills: ADL, Strength, Pain, UE functional use, ROM, IADL, Scar mobility, Sensation, Decreased knowledge of precautions       Visit Diagnosis: Pain in left hand  Pain in left wrist  Pain in right hand  Pain in right wrist  Stiffness of right wrist, not elsewhere classified  Muscle weakness (generalized)  Stiffness of left wrist, not elsewhere classified    Problem List Patient Active Problem List   Diagnosis Date Noted   Adrenal insufficiency ([place]) 01/26/2021   Nausea vomiting and diarrhea 01/26/2021   Severe sepsis ([place]) 01/26/2021   Hypokalemia 01/26/2021   Asthma 01/26/2021   GERD (gastroesophageal reflux disease) 01/26/2021   Tobacco use  01/26/2021   Hypomagnesemia 01/26/2021   CAP (community acquired pneumonia) 01/26/2021    Rosalyn Gess, OTR/L,CLT 02/16/2021, 3:28 PM   [facility] [address], [place], [address] Phone: [phone]   Fax:  [phone]  Name: Zabella Wease MRN: [id number] Date of Birth: 03-08-1980

## 2021-02-21 ENCOUNTER — Ambulatory Visit: Payer: BC Managed Care – PPO | Admitting: Occupational Therapy

## 2021-02-23 ENCOUNTER — Ambulatory Visit: Payer: BC Managed Care – PPO | Admitting: Occupational Therapy

## 2021-02-24 ENCOUNTER — Ambulatory Visit (INDEPENDENT_AMBULATORY_CARE_PROVIDER_SITE_OTHER): Payer: BC Managed Care – PPO | Admitting: Nurse Practitioner

## 2021-02-24 ENCOUNTER — Encounter: Payer: Self-pay | Admitting: Nurse Practitioner

## 2021-02-24 VITALS — BP 104/60 | HR 97 | Temp 98.5°F | Resp 16 | Ht 65.0 in | Wt 171.3 lb

## 2021-02-24 DIAGNOSIS — E2740 Unspecified adrenocortical insufficiency: Secondary | ICD-10-CM | POA: Diagnosis not present

## 2021-02-24 DIAGNOSIS — J189 Pneumonia, unspecified organism: Secondary | ICD-10-CM | POA: Diagnosis not present

## 2021-02-24 DIAGNOSIS — G5601 Carpal tunnel syndrome, right upper limb: Secondary | ICD-10-CM

## 2021-02-24 DIAGNOSIS — D649 Anemia, unspecified: Secondary | ICD-10-CM

## 2021-02-24 DIAGNOSIS — Z1159 Encounter for screening for other viral diseases: Secondary | ICD-10-CM

## 2021-02-24 DIAGNOSIS — E876 Hypokalemia: Secondary | ICD-10-CM

## 2021-02-24 DIAGNOSIS — H9201 Otalgia, right ear: Secondary | ICD-10-CM

## 2021-02-24 DIAGNOSIS — J4520 Mild intermittent asthma, uncomplicated: Secondary | ICD-10-CM

## 2021-02-24 DIAGNOSIS — Z7689 Persons encountering health services in other specified circumstances: Secondary | ICD-10-CM

## 2021-02-24 DIAGNOSIS — K219 Gastro-esophageal reflux disease without esophagitis: Secondary | ICD-10-CM

## 2021-02-24 DIAGNOSIS — Z23 Encounter for immunization: Secondary | ICD-10-CM

## 2021-02-24 DIAGNOSIS — G5602 Carpal tunnel syndrome, left upper limb: Secondary | ICD-10-CM

## 2021-02-24 DIAGNOSIS — Z13220 Encounter for screening for lipoid disorders: Secondary | ICD-10-CM

## 2021-02-24 DIAGNOSIS — E259 Adrenogenital disorder, unspecified: Secondary | ICD-10-CM

## 2021-02-24 DIAGNOSIS — Z111 Encounter for screening for respiratory tuberculosis: Secondary | ICD-10-CM

## 2021-02-24 NOTE — Progress Notes (Signed)
BP 104/60    Pulse 97    Temp 98.5 F (36.9 C) (Oral)    Resp 16    Ht 5\' 5"  (1.937 m)    Wt 171 lb 4.8 oz (77.7 kg)    LMP 02/10/2021 (Approximate)    SpO2 98%    BMI 28.51 kg/m    Subjective:    Patient ID: Margaret Gates, female    DOB: [birth year], 41 y.o.   MRN: [id number]  HPI: Margaret Gates is a 41 y.o. female  Chief Complaint  Patient presents with   Establish Care   Hospitalization Follow-up    Diagnosed with pneumonia and GI bug.   Establish care: It has been awhile since her last physical.   Hospital follow-up: Recent diagnosis with pneumonia on 01/26/2021. She says it started with a headache and then she was vomiting with abdominal pain.  She was diagnosed with a GI bug and walking pneumonia.  Upon inspecting her labs her potassium was 3.3 and she was slightly anemic.  Will recheck labs. She is not short of breath and is doing better since being discharged.   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 Currently taking hydrocortisone 15 mg daily,  fludrocortisone 0.1 mg daily an dexamethasone 0.25 mg at night. She says she is doing good right now.  She says she has a small mass around her adrenal gland but the size has been stable.  She goes back to see endo 03/13/21.  Asthma: She says she has flares when the weather changes or illness.  She says she uses albuterol as needed, most the time once a month.   GERD: She says she is taking omeprazole every day.  She says that some days her acid reflux can be bad.    Carpel tunnel bilateral: She sees Dr. Roland Rack (Ortho).  She says she had surery on the left on 01/23/2021 and on the right on 10/14/2020.  She is currently doing physical therapy.   Right ear fullness: She says for awhile now she has had a fullness in her right ear and sounds are muffled.  Unable to visualize ear canal or TM.  Will refer to ENT.   Relevant past medical, surgical, family and social  history reviewed and updated as indicated. Interim medical history since our last visit reviewed. Allergies and medications reviewed and updated.  Review of Systems  Constitutional: Negative for fever or weight change.  HEENT: positive for right ear pain/fullness Respiratory: Negative for cough and shortness of breath.   Cardiovascular: Negative for chest pain or palpitations.  Gastrointestinal: Negative for abdominal pain, no bowel changes.  Musculoskeletal: Negative for gait problem or joint swelling.  Skin: Negative for rash.  Neurological: Negative for dizziness or headache.  No other specific complaints in a complete review of systems (except as listed in HPI above).      Objective:    BP 104/60    Pulse 97    Temp 98.5 F (36.9 C) (Oral)    Resp 16    Ht 5\' 5"  (1.651 m)    Wt 171 lb 4.8 oz (77.7 kg)    LMP 02/10/2021 (Approximate)    SpO2 98%    BMI 28.51 kg/m   Wt Readings from Last 3 Encounters:  02/24/21 171 lb 4.8 oz (77.7 kg)  01/26/21 170 lb (77.1 kg)  10/26/20 160 lb (72.6 kg)    Physical Exam  Constitutional: Patient appears well-developed and  well-nourished.  No distress.  HEENT: head atraumatic, normocephalic, pupils equal and reactive to light, ears right TM can not visualize, left TM clear, neck supple, throat within normal limits Cardiovascular: Normal rate, regular rhythm and normal heart sounds.  No murmur heard. No BLE edema. Pulmonary/Chest: Effort normal and breath sounds normal. No respiratory distress. Abdominal: Soft.  There is no tenderness. Psychiatric: Patient has a normal mood and affect. behavior is normal. Judgment and thought content normal.   Results for orders placed or performed during the hospital encounter of 01/26/21  Resp Panel by RT-PCR (Flu A&B, Covid) Nasopharyngeal Swab   Specimen: Nasopharyngeal Swab; Nasopharyngeal(NP) swabs in vial transport medium  Result Value Ref Range   SARS Coronavirus 2 by RT PCR NEGATIVE NEGATIVE    Influenza A by PCR NEGATIVE NEGATIVE   Influenza B by PCR NEGATIVE NEGATIVE  Urine Culture   Specimen: Urine, Clean Catch  Result Value Ref Range   Specimen Description      URINE, CLEAN CATCH Performed at [facility], [address], [place], [place] [address]    Special Requests      NONE Performed at [facility], [address] [place], [place] [address]    Culture (A)     <10,000 COLONIES/mL INSIGNIFICANT GROWTH Performed at [facility] [address] [place], [place] [address]    Report Status 01/27/2021 FINAL   CULTURE, BLOOD (ROUTINE X 2) w Reflex to ID Panel   Specimen: BLOOD  Result Value Ref Range   Specimen Description BLOOD BLOOD LEFT WRIST    Special Requests      BOTTLES DRAWN AEROBIC AND ANAEROBIC Blood Culture adequate volume   Culture      NO GROWTH 5 DAYS Performed at [facility], [address] [place], [place] [address]    Report Status 01/31/2021 FINAL   CULTURE, BLOOD (ROUTINE X 2) w Reflex to ID Panel   Specimen: BLOOD  Result Value Ref Range   Specimen Description BLOOD LEFT ANTECUBITAL    Special Requests      BOTTLES DRAWN AEROBIC AND ANAEROBIC Blood Culture adequate volume   Culture      NO GROWTH 5 DAYS Performed at [facility], [address] [place], [place] [address]    Report Status 01/31/2021 FINAL   Expectorated Sputum Assessment w Gram Stain, Rflx to Resp Cult   Specimen: Expectorated Sputum  Result Value Ref Range   Specimen Description EXPECTORATED SPUTUM    Special Requests NONE    Sputum evaluation      Sputum specimen not acceptable for testing.  Please recollect.   RESULT CALLED TO, READ BACK BY AND VERIFIED WITH: JASMYNE DAVIS @1915  ON 01/26/21 SKL Performed at [facility], [address] [place], [place] [address]    Report Status 01/26/2021 FINAL   Lipase, blood  Result Value Ref Range   Lipase 37 11 - 51 U/L  Comprehensive metabolic panel  Result Value  Ref Range   Sodium 136 135 - 145 mmol/L   Potassium 3.1 (L) 3.5 - 5.1 mmol/L   Chloride 104 98 - 111 mmol/L   CO2 21 (L) 22 - 32 mmol/L   Glucose, Bld 114 (H) 70 - 99 mg/dL   BUN 14 6 - 20 mg/dL   Creatinine, Ser 0.75 0.44 - 1.00 mg/dL   Calcium 8.8 (L) 8.9 - 10.3 mg/dL   Total Protein 6.8 6.5 - 8.1 g/dL   Albumin 4.4 3.5 - 5.0 g/dL  AST 19 15 - 41 U/L   ALT 17 0 - 44 U/L   Alkaline Phosphatase 53 38 - 126 U/L   Total Bilirubin 0.9 0.3 - 1.2 mg/dL   GFR, Estimated >60 >60 mL/min   Anion gap 11 5 - 15  CBC  Result Value Ref Range   WBC 13.5 (H) 4.0 - 10.5 K/uL   RBC 4.21 3.87 - 5.11 MIL/uL   Hemoglobin 14.4 12.0 - 15.0 g/dL   HCT 41.1 36.0 - 46.0 %   MCV 97.6 80.0 - 100.0 fL   MCH 34.2 (H) 26.0 - 34.0 pg   MCHC 35.0 30.0 - 36.0 g/dL   RDW 11.6 11.5 - 15.5 %   Platelets 248 150 - 400 K/uL   nRBC 0.0 0.0 - 0.2 %  Urinalysis, Routine w reflex microscopic Urine, Clean Catch  Result Value Ref Range   Color, Urine YELLOW YELLOW   APPearance CLEAR CLEAR   Specific Gravity, Urine 1.020 1.005 - 1.030   pH 5.0 5.0 - 8.0   Glucose, UA NEGATIVE NEGATIVE mg/dL   Hgb urine dipstick NEGATIVE NEGATIVE   Bilirubin Urine NEGATIVE NEGATIVE   Ketones, ur TRACE (A) NEGATIVE mg/dL   Protein, ur NEGATIVE NEGATIVE mg/dL   Nitrite NEGATIVE NEGATIVE   Leukocytes,Ua NEGATIVE NEGATIVE  hCG, quantitative, pregnancy  Result Value Ref Range   hCG, Beta Chain, Quant, S 1 <5 mIU/mL  Lactic acid, plasma  Result Value Ref Range   Lactic Acid, Venous 2.3 (HH) 0.5 - 1.9 mmol/L  Urinalysis, Complete w Microscopic Urine, Clean Catch  Result Value Ref Range   Color, Urine YELLOW YELLOW   APPearance CLEAR CLEAR   Specific Gravity, Urine 1.020 1.005 - 1.030   pH 5.0 5.0 - 8.0   Glucose, UA NEGATIVE NEGATIVE mg/dL   Hgb urine dipstick NEGATIVE NEGATIVE   Bilirubin Urine NEGATIVE NEGATIVE   Ketones, ur TRACE (A) NEGATIVE mg/dL   Protein, ur NEGATIVE NEGATIVE mg/dL   Nitrite NEGATIVE NEGATIVE    Leukocytes,Ua NEGATIVE NEGATIVE   Squamous Epithelial / LPF 0-5 0 - 5   WBC, UA 0-5 0 - 5 WBC/hpf   RBC / HPF 0-5 0 - 5 RBC/hpf   Bacteria, UA NONE SEEN NONE SEEN   Mucus PRESENT   Procalcitonin - Baseline  Result Value Ref Range   Procalcitonin 2.67 ng/mL  Lactic acid, plasma  Result Value Ref Range   Lactic Acid, Venous 2.2 (HH) 0.5 - 1.9 mmol/L  Lactic acid, plasma  Result Value Ref Range   Lactic Acid, Venous 1.0 0.5 - 1.9 mmol/L  Magnesium  Result Value Ref Range   Magnesium 1.5 (L) 1.7 - 2.4 mg/dL  HIV Antibody (routine testing w rflx)  Result Value Ref Range   HIV Screen 4th Generation wRfx Non Reactive Non Reactive  Legionella Pneumophila Serogp 1 Ur Ag  Result Value Ref Range   L. pneumophila Serogp 1 Ur Ag Negative Negative   Source of Sample URINE, RANDOM   Strep pneumoniae urinary antigen  Result Value Ref Range   Strep Pneumo Urinary Antigen NEGATIVE NEGATIVE  Basic metabolic panel  Result Value Ref Range   Sodium 137 135 - 145 mmol/L   Potassium 3.7 3.5 - 5.1 mmol/L   Chloride 110 98 - 111 mmol/L   CO2 23 22 - 32 mmol/L   Glucose, Bld 121 (H) 70 - 99 mg/dL   BUN 7 6 - 20 mg/dL   Creatinine, Ser 0.60 0.44 - 1.00 mg/dL   Calcium  8.2 (L) 8.9 - 10.3 mg/dL   GFR, Estimated >60 >60 mL/min   Anion gap 4 (L) 5 - 15  CBC  Result Value Ref Range   WBC 5.8 4.0 - 10.5 K/uL   RBC 3.39 (L) 3.87 - 5.11 MIL/uL   Hemoglobin 11.4 (L) 12.0 - 15.0 g/dL   HCT 33.7 (L) 36.0 - 46.0 %   MCV 99.4 80.0 - 100.0 fL   MCH 33.6 26.0 - 34.0 pg   MCHC 33.8 30.0 - 36.0 g/dL   RDW 11.9 11.5 - 15.5 %   Platelets 205 150 - 400 K/uL   nRBC 0.0 0.0 - 0.2 %  Magnesium  Result Value Ref Range   Magnesium 2.1 1.7 - 2.4 mg/dL  Glucose, capillary  Result Value Ref Range   Glucose-Capillary 110 (H) 70 - 99 mg/dL  CBC  Result Value Ref Range   WBC 8.0 4.0 - 10.5 K/uL   RBC 3.41 (L) 3.87 - 5.11 MIL/uL   Hemoglobin 11.3 (L) 12.0 - 15.0 g/dL   HCT 34.3 (L) 36.0 - 46.0 %   MCV 100.6 (H)  80.0 - 100.0 fL   MCH 33.1 26.0 - 34.0 pg   MCHC 32.9 30.0 - 36.0 g/dL   RDW 12.1 11.5 - 15.5 %   Platelets 219 150 - 400 K/uL   nRBC 0.0 0.0 - 0.2 %  Basic metabolic panel  Result Value Ref Range   Sodium 141 135 - 145 mmol/L   Potassium 3.3 (L) 3.5 - 5.1 mmol/L   Chloride 110 98 - 111 mmol/L   CO2 21 (L) 22 - 32 mmol/L   Glucose, Bld 119 (H) 70 - 99 mg/dL   BUN 7 6 - 20 mg/dL   Creatinine, Ser 0.63 0.44 - 1.00 mg/dL   Calcium 8.9 8.9 - 10.3 mg/dL   GFR, Estimated >60 >60 mL/min   Anion gap 10 5 - 15  Glucose, capillary  Result Value Ref Range   Glucose-Capillary 116 (H) 70 - 99 mg/dL  POC urine preg, ED  Result Value Ref Range   Preg Test, Ur Negative Negative      Assessment & Plan:   1. History of Community acquired pneumonia, unspecified laterality -improved  2. Hypokalemia -rechecking from hospital result - COMPLETE METABOLIC PANEL WITH GFR  3. Anemia, unspecified type -rechecking from hospital result - CBC with Differential/Platelet  4. Adrenal insufficiency (HCC) -continue current treatment  5. 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 Mild intermittent asthma, unspecified whether complicated -continue current treatment   7. Gastroesophageal reflux disease without esophagitis -continue current treatment  8. Screening for tuberculosis  - QuantiFERON-TB Gold Plus  9. Screening for cholesterol level  - Lipid panel  10. Encounter for hepatitis C screening test for low risk patient  - Hepatitis C antibody  11. Need for Tdap vaccination  - Tdap vaccine greater than or equal to 7yo IM  12. Encounter to establish care -make appointment for cpe  13. Carpal tunnel syndrome, right -continue current treatment  14. Carpal tunnel syndrome, left -continue current treatment  15. Right ear pain  - Ambulatory referral to ENT   Follow up plan: Return in about 6 months (around 08/24/2021) for cpe.

## 2021-02-27 ENCOUNTER — Ambulatory Visit: Payer: BC Managed Care – PPO | Admitting: Occupational Therapy

## 2021-02-27 ENCOUNTER — Encounter: Payer: Self-pay | Admitting: Nurse Practitioner

## 2021-02-27 ENCOUNTER — Other Ambulatory Visit: Payer: Self-pay

## 2021-02-27 DIAGNOSIS — M6281 Muscle weakness (generalized): Secondary | ICD-10-CM

## 2021-02-27 DIAGNOSIS — M79642 Pain in left hand: Secondary | ICD-10-CM

## 2021-02-27 DIAGNOSIS — M79641 Pain in right hand: Secondary | ICD-10-CM

## 2021-02-27 DIAGNOSIS — M25631 Stiffness of right wrist, not elsewhere classified: Secondary | ICD-10-CM

## 2021-02-27 DIAGNOSIS — M25632 Stiffness of left wrist, not elsewhere classified: Secondary | ICD-10-CM

## 2021-02-27 DIAGNOSIS — M25532 Pain in left wrist: Secondary | ICD-10-CM

## 2021-02-27 DIAGNOSIS — M25531 Pain in right wrist: Secondary | ICD-10-CM

## 2021-02-27 NOTE — Therapy (Signed)
[facility] [address] [place], [address] Phone: [phone]   Fax:  [phone]  Occupational Therapy Treatment  Patient Details  Name: Margaret Gates MRN: [id number] Date of Birth: 11/26/1980 Referring Provider (OT): Dr Roland Rack   Encounter Date: 02/27/2021   OT End of Session - 02/27/21 1351     Visit Number 6    Number of Visits 12    Date for OT Re-Evaluation 03/16/21    OT Start Time 0901    OT Stop Time 0946    OT Time Calculation (min) 45 min    Activity Tolerance Patient tolerated treatment well    Behavior During Therapy [facility] for tasks assessed/performed             Past Medical History:  Diagnosis Date   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HCC)    Asthma    Bronchitis    GERD (gastroesophageal reflux disease)    Headache    Tobacco use     Past Surgical History:  Procedure Laterality Date   CARPAL TUNNEL RELEASE Right 08/31/2020   Procedure: CARPAL TUNNEL RELEASE ENDOSCOPIC;  Surgeon: Corky Mull, MD;  Location: [facility] ORS;  Service: Orthopedics;  Laterality: Right;   CARPAL TUNNEL RELEASE Left 10/26/2020   Procedure: CARPAL TUNNEL RELEASE ENDOSCOPIC;  Surgeon: Corky Mull, MD;  Location: [facility] ORS;  Service: Orthopedics;  Laterality: Left;   CESAREAN SECTION     HIP SURGERY Bilateral    LAPROSCOPIC BONE SPURS    There were no vitals filed for this visit.   Subjective Assessment - 02/27/21 1349     Subjective  Sorry- I had to cancel last week - my son and husband was sick and did not wanted to make you all sick - doing okay -more strength -can feel the L hand still want to swell    Pertinent History Pt was seen by Dr Roland Rack on 01/23/21 for follow up  12 weeks status post an endoscopic left carpal tunnel release ( 10/26/20).  R CTR done 08/31/20. Overall, the patient feels that she is doing reasonably well. However, she continues to experience moderate pain in her  L wrist which she rates at  5/10 on today's visit, and for which she will take ibuprofen and/or Tylenol as necessary with temporary partial relief of her symptoms. She localizes the pain to the aspect of her hand. In addition, she will continue to wear her brace at night as she finds this to be helpful in enabling her to sleep. She still notes difficulty lifting objects as well as some residual paresthesias to her fingertips. She denies any reinjury to the wrist or hand, and denies any fevers or chills. She has not yet returned to work, but does not feel that she can do the work that would be required of her, such as lifting 50 pounds.  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 Yes    Pain Score 3     Pain Location Hand    Pain Orientation Right;Left    Pain Descriptors / Indicators Aching;Tender    Pain Type Surgical pain    Pain Onset More than a month ago  Pain Frequency Intermittent                [facility] OT Assessment - 02/27/21 0001       Strength   Right Hand Grip (lbs) 80    Right Hand Lateral Pinch 16 lbs    Right Hand 3 Point Pinch 15 lbs    Left Hand Grip (lbs) 77    Left Hand Lateral Pinch 14 lbs    Left Hand 3 Point Pinch 15 lbs           Great progress in L grip and 3 point pinch - see flowsheet   pt  doing better using her hands when pulling and pushing heavy door- need to use body less compare to last time Could carry 9 lbs on R and L 6 lbs -but  less pull  in thumbs and forearm than last time   Cont with  3 lbs on the R and L 1 lbs - pt to do 1 x day 2 x 10 reps  elbow flexion, ext, shoulder punches and scapula retraction but after 24-48 hrs because of dry needling done -                         OT Treatments/Exercises (OP) - 02/27/21 0001       Cryotherapy   Number  Minutes Cryotherapy 6 Minutes    Cryotherapy Location Hand   bilateral   Type of Cryotherapy Ice pack      RUE Paraffin   Number Minutes Paraffin 8 Minutes    RUE Paraffin Location Hand    Comments prior to soft tissue      LUE Paraffin   Number Minutes Paraffin 8 Minutes    LUE Paraffin Location Hand;Wrist    Comments prior to soft tissue              AAROM and light stretches by OT for wrist flexion, ext  Scar massage done this  date  and forearm graston tool nr 2 on forearm by OT  Prior to dry needling    Trigger Point Dry Needling (TDN), unbilled. Education performed with patient regarding potential benefit of TDN. Reviewed precautions and risks with patient. Pt provided verbal consent to treatment. TDN performed with 0.30 x 30 single needle placements with local twitch response (LTR). Pistoning technique utilized. Improved pain-free motion following intervention. Muscles targeted: thenar eminence including flexor and abductor pollicis brevis, bilaterally.  Patrina Levering DPT     Tendon glides AROM  10 reps Thumb PA and RA AAROM  Keep pain under 2/10  And do ice again today  Hold off on strengthening for 24-48 hrs  Built up handles and use larger joints to pick up or hold objects             OT Education - 02/27/21 1350     Education Details progress, dry needling ed  and change to HEP    Person(s) Educated Patient    Methods Explanation;Demonstration;Tactile cues;Verbal cues;Handout    Comprehension Verbal cues required;Returned demonstration;Verbalized understanding              OT Short Term Goals - 02/02/21 1405       OT SHORT TERM GOAL #1   Title Pt and husband to be independent in HEP to decrease scar tissue and pain to less than 2/10  , and increase AROM    Baseline pain 8/10 R hand lifting, L 6-10/10 at rest  to using - decrease wrist AROM and pain    Time 3    Period Weeks    Status New    Target Date 02/23/21               OT Long  Term Goals - 02/02/21 1407       OT LONG TERM GOAL #1   Title Bilateral wrist AROM increase to WNL and pain free to push up from chair , push and pull door open    Baseline decrease wrist AROM in all planes and pain    Time 3    Period Weeks    Status New    Target Date 02/23/21      OT LONG TERM GOAL #2   Title Pt' s bilateral  grip and prehension strenght increase within her range for her age to carry more than 10 lbs, push , pull  heavy door  without symptoms    Baseline Grip R51, L 31 pain , Lat grip R 13, L11 pain , 3 poin pinch R 9 , L 11 lbs pain -    Time 6    Period Weeks    Status New    Target Date 03/16/21      OT LONG TERM GOAL #3   Title Pain on PRWHE  outcome measure improve with more than 20 points    Baseline PRWHE pain score 38/50 for hand and wrist pain    Time 6    Period Weeks    Status New    Target Date 03/16/21                   Plan - 02/27/21 1351     Clinical Impression Statement Pt refer to OT s/p L CTR endoscopy 10/26/20- , pt report she had in Aug 22 R CTR.  At eval  had increase scar tissue , pain in bilateral wrist , palms and thumb 6-10/10 with decrease AROM and grip/prehension. Pt report  she has to be able to handle more than 50 lbs with her hands at work.   Pt cont to make great progress in R hand more than L hand , AROM for bilateral wrist and digits WNL including opposition, Great progress in grip  and prehension bilateral. Still pain and tenderness  over L CT and thumb in the L  more than the R. Tenderness over 1 st dorsal compartments are improving as well as positive [name] negative this date . Cont to be  tight and tender in bilateral webspace -  Dr Roland Rack did provided order for dryneedling by PT - done this date -pt tolerate well - done AROM and ice afterwards. Pt to hold off for 24-48 hr on strengthening. Only some AROM and ice.  Pt could pick up and carry about 6 lbs in L , 9 lbs on R - very light pull over thumbs and palms.    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    OT Occupational Profile and History Problem Focused Assessment - Including review of records relating to presenting problem    Occupational performance deficits (Please refer to evaluation for details): ADL's;IADL's;Rest and Sleep;Work;Play;Leisure;Social Participation    Body Structure / Function / Physical Skills ADL;Strength;Pain;UE  functional use;ROM;IADL;Scar mobility;Sensation;Decreased knowledge of precautions    Rehab Potential Good    Clinical Decision Making Limited treatment options, no task modification necessary    Comorbidities Affecting Occupational Performance: None    Modification or Assistance to Complete Evaluation  No modification of tasks or assist necessary to complete eval    OT Frequency 2x / week    OT Duration 6 weeks    OT Treatment/Interventions Self-care/ADL training;Paraffin;Ultrasound;Moist Heat;Fluidtherapy;Contrast Bath;Therapeutic exercise;Manual Therapy;Patient/family education;Passive range of motion;Scar mobilization;Therapeutic activities;Splinting;DME and/or AE instruction    Consulted and Agree with Plan of Care Patient             Patient will benefit from skilled therapeutic intervention in order to improve the following deficits and impairments:   Body Structure / Function / Physical Skills: ADL, Strength, Pain, UE functional use, ROM, IADL, Scar mobility, Sensation, Decreased knowledge of precautions       Visit Diagnosis: Pain in left hand  Pain in left wrist  Pain in right hand  Pain in right wrist  Stiffness of right wrist, not elsewhere classified  Muscle weakness (generalized)  Stiffness of left wrist, not elsewhere classified    Problem List Patient Active Problem List    Diagnosis Date Noted   Adrenal insufficiency ([place]) 01/26/2021   Nausea vomiting and diarrhea 01/26/2021   Severe sepsis ([place]) 01/26/2021   Hypokalemia 01/26/2021   Asthma 01/26/2021   GERD (gastroesophageal reflux disease) 01/26/2021   Tobacco use 01/26/2021   Hypomagnesemia 01/26/2021   CAP (community acquired pneumonia) 01/26/2021   Carpal tunnel syndrome, right 08/19/2020   Tear of left acetabular labrum 05/06/2015   Other specified postprocedural states 03/23/2015   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 [place], [address] Phone: [phone]   Fax:  [phone]  Name: Margaret Gates MRN: [id number] Date of Birth: May 29, 1980

## 2021-02-28 LAB — CBC WITH DIFFERENTIAL/PLATELET
Absolute Monocytes: 737 cells/uL (ref 200–950)
Basophils Absolute: 46 cells/uL (ref 0–200)
Basophils Relative: 0.5 %
Eosinophils Absolute: 346 cells/uL (ref 15–500)
Eosinophils Relative: 3.8 %
HCT: 40.5 % (ref 35.0–45.0)
Hemoglobin: 13.9 g/dL (ref 11.7–15.5)
Lymphs Abs: 3367 cells/uL (ref 850–3900)
MCH: 34.3 pg — ABNORMAL HIGH (ref 27.0–33.0)
MCHC: 34.3 g/dL (ref 32.0–36.0)
MCV: 100 fL (ref 80.0–100.0)
MPV: 10 fL (ref 7.5–12.5)
Monocytes Relative: 8.1 %
Neutro Abs: 4605 cells/uL (ref 1500–7800)
Neutrophils Relative %: 50.6 %
Platelets: 295 10*3/uL (ref 140–400)
RBC: 4.05 10*6/uL (ref 3.80–5.10)
RDW: 11.4 % (ref 11.0–15.0)
Total Lymphocyte: 37 %
WBC: 9.1 10*3/uL (ref 3.8–10.8)

## 2021-02-28 LAB — COMPLETE METABOLIC PANEL WITH GFR
AG Ratio: 2.4 (calc) (ref 1.0–2.5)
ALT: 14 U/L (ref 6–29)
AST: 8 U/L — ABNORMAL LOW (ref 10–30)
Albumin: 4.8 g/dL (ref 3.6–5.1)
Alkaline phosphatase (APISO): 61 U/L (ref 31–125)
BUN: 15 mg/dL (ref 7–25)
CO2: 25 mmol/L (ref 20–32)
Calcium: 9.7 mg/dL (ref 8.6–10.2)
Chloride: 108 mmol/L (ref 98–110)
Creat: 0.69 mg/dL (ref 0.50–0.99)
Globulin: 2 g/dL (calc) (ref 1.9–3.7)
Glucose, Bld: 88 mg/dL (ref 65–99)
Potassium: 4 mmol/L (ref 3.5–5.3)
Sodium: 139 mmol/L (ref 135–146)
Total Bilirubin: 0.4 mg/dL (ref 0.2–1.2)
Total Protein: 6.8 g/dL (ref 6.1–8.1)
eGFR: 112 mL/min/{1.73_m2} (ref >=60)

## 2021-02-28 LAB — LIPID PANEL
Cholesterol: 220 mg/dL — ABNORMAL HIGH (ref <200)
HDL: 50 mg/dL (ref >=50)
LDL Cholesterol (Calc): 139 mg/dL (calc) — ABNORMAL HIGH
Non-HDL Cholesterol (Calc): 170 mg/dL (calc) — ABNORMAL HIGH (ref <130)
Total CHOL/HDL Ratio: 4.4 (calc) (ref <5.0)
Triglycerides: 174 mg/dL — ABNORMAL HIGH (ref <150)

## 2021-02-28 LAB — QUANTIFERON-TB GOLD PLUS
Mitogen-NIL: >10 IU/mL
NIL: 0.03 IU/mL
QuantiFERON-TB Gold Plus: NEGATIVE
TB1-NIL: 0 IU/mL
TB2-NIL: 0.01 IU/mL

## 2021-02-28 LAB — HEPATITIS C ANTIBODY
Hepatitis C Ab: NONREACTIVE
SIGNAL TO CUT-OFF: <0.02 (ref <1.00)

## 2021-03-03 ENCOUNTER — Other Ambulatory Visit: Payer: Self-pay

## 2021-03-03 ENCOUNTER — Ambulatory Visit: Payer: BC Managed Care – PPO | Attending: Surgery | Admitting: Occupational Therapy

## 2021-03-03 DIAGNOSIS — M25531 Pain in right wrist: Secondary | ICD-10-CM | POA: Diagnosis present

## 2021-03-03 DIAGNOSIS — M79641 Pain in right hand: Secondary | ICD-10-CM | POA: Diagnosis present

## 2021-03-03 DIAGNOSIS — M25632 Stiffness of left wrist, not elsewhere classified: Secondary | ICD-10-CM | POA: Diagnosis present

## 2021-03-03 DIAGNOSIS — M79642 Pain in left hand: Secondary | ICD-10-CM | POA: Insufficient documentation

## 2021-03-03 DIAGNOSIS — M25631 Stiffness of right wrist, not elsewhere classified: Secondary | ICD-10-CM | POA: Insufficient documentation

## 2021-03-03 DIAGNOSIS — M25532 Pain in left wrist: Secondary | ICD-10-CM | POA: Diagnosis present

## 2021-03-03 DIAGNOSIS — M6281 Muscle weakness (generalized): Secondary | ICD-10-CM | POA: Insufficient documentation

## 2021-03-03 NOTE — Therapy (Signed)
[facility] [address] [place], [address] Phone: [phone]   Fax:  [phone]  Occupational Therapy Treatment  Patient Details  Name: Margaret Gates MRN: [id number] Date of Birth: [birth year] Referring Provider (OT): Dr Roland Rack   Encounter Date: 03/03/2021   OT End of Session - 03/03/21 1004     Visit Number 7    Number of Visits 12    Date for OT Re-Evaluation 03/16/21    OT Start Time 1004    OT Stop Time 1044    OT Time Calculation (min) 40 min    Activity Tolerance Patient tolerated treatment well    Behavior During Therapy [facility] for tasks assessed/performed             Past Medical History:  Diagnosis Date   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[place])    Asthma    Bronchitis    GERD (gastroesophageal reflux disease)    Headache    Tobacco use     Past Surgical History:  Procedure Laterality Date   CARPAL TUNNEL RELEASE Right 08/31/2020   Procedure: CARPAL TUNNEL RELEASE ENDOSCOPIC;  Surgeon: Corky Mull, MD;  Location: [facility] ORS;  Service: Orthopedics;  Laterality: Right;   CARPAL TUNNEL RELEASE Left 10/26/2020   Procedure: CARPAL TUNNEL RELEASE ENDOSCOPIC;  Surgeon: Corky Mull, MD;  Location: [facility] ORS;  Service: Orthopedics;  Laterality: Left;   CESAREAN SECTION     HIP SURGERY Bilateral    LAPROSCOPIC BONE SPURS    There were no vitals filed for this visit.   Subjective Assessment - 03/03/21 1004     Subjective  My thumb hurts so bad after the dryneedling last time -and had to drive and that was tought - thumbs are still sore - but did start back some of the exercises yesterday    Pertinent History Pt was seen by Dr Roland Rack on 01/23/21 for follow up  12 weeks status post an endoscopic left carpal tunnel release ( 10/26/20).  R CTR done 08/31/20. Overall, the patient feels that she is doing reasonably well. However, she continues to experience moderate pain in her  L wrist which she rates at  5/10 on today's visit, and for which she will take ibuprofen and/or Tylenol as necessary with temporary partial relief of her symptoms. She localizes the pain to the aspect of her hand. In addition, she will continue to wear her brace at night as she finds this to be helpful in enabling her to sleep. She still notes difficulty lifting objects as well as some residual paresthesias to her fingertips. She denies any reinjury to the wrist or hand, and denies any fevers or chills. She has not yet returned to work, but does not feel that she can do the work that would be required of her, such as lifting 50 pounds.  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 Yes    Pain Score 4     Pain Location --   thumbs   Pain Orientation Right;Left    Pain Descriptors / Indicators Aching;Tender    Pain Type Acute pain;Surgical pain    Pain Onset More than a month ago  Pain Frequency Intermittent                [facility] OT Assessment - 03/03/21 0001       Strength   Right Hand Grip (lbs) 80    Right Hand Lateral Pinch 16 lbs    Right Hand 3 Point Pinch 15 lbs    Left Hand Grip (lbs) 77    Left Hand Lateral Pinch 14 lbs    Left Hand 3 Point Pinch 15 lbs             Great progress in grip and prehension - see flow sheet  AROM WNL in bilateral hands and wrist -except composite wrist flexion on R  Some soreness in thumbs after dryneedling in bilateral webspace but less tightness  And decrease tenderness over distal radius - negative Finkelstein         OT Treatments/Exercises (OP) - 03/03/21 0001       RUE Fluidotherapy   Number Minutes Fluidotherapy 8 Minutes    RUE Fluidotherapy Location Hand;Wrist    Comments AROM in all planes      LUE Fluidotherapy   Number Minutes Fluidotherapy 8  Minutes    LUE Fluidotherapy Location Hand;Wrist    Comments AROM in all planes              pt to use heat prior to her HEP - she got paraffin  But can do contrast if increase edema or pain over volar wrist after soft tissue  or prior  Soft tissue mobs done-  MC spreads , webspace   AAROM and light stretches by OT for  composite wrist flexion, ext  Scar massage done this  date and review again with pt for L more than R  progressing well      Cont with Tendon glides AROM  Opposition to 5th - slide down 5th stop when feeling pull - 5 reps  2 x day  Thumb PA and RA AAROM  Keep pain under 2/10  And add this date RTB for scapula retraction and shoulders extention , and biceps and tricepts  2x 10 reps - light grip  Pain free  1 x day           OT Education - 03/03/21 1004     Education Details progress, dry needling ed  and change to HEP    Person(s) Educated Patient    Methods Explanation;Demonstration;Tactile cues;Verbal cues;Handout    Comprehension Verbal cues required;Returned demonstration;Verbalized understanding              OT Short Term Goals - 02/02/21 1405       OT SHORT TERM GOAL #1   Title Pt and husband to be independent in HEP to decrease scar tissue and pain to less than 2/10  , and increase AROM    Baseline pain 8/10 R hand lifting, L 6-10/10 at rest to using - decrease wrist AROM and pain    Time 3    Period Weeks    Status New    Target Date 02/23/21               OT Long Term Goals - 02/02/21 1407       OT LONG TERM GOAL #1   Title Bilateral wrist AROM increase to WNL and pain free to push up from chair , push and pull door open    Baseline decrease wrist AROM in all planes and pain    Time 3  Period Weeks    Status New    Target Date 02/23/21      OT LONG TERM GOAL #2   Title Pt' s bilateral  grip and prehension strenght increase within her range for her age to carry more than 10 lbs, push , pull  heavy door  without  symptoms    Baseline Grip R51, L 31 pain , Lat grip R 13, L11 pain , 3 poin pinch R 9 , L 11 lbs pain -    Time 6    Period Weeks    Status New    Target Date 03/16/21      OT LONG TERM GOAL #3   Title Pain on PRWHE  outcome measure improve with more than 20 points    Baseline PRWHE pain score 38/50 for hand and wrist pain    Time 6    Period Weeks    Status New    Target Date 03/16/21                   Plan - 03/03/21 1004     Clinical Impression Statement Pt refer to OT s/p L CTR endoscopy 10/26/20- , pt report she had in Aug 22 R CTR.  Pt made great progress in 4 wks in scar tissue , pain was 6-10/10 pain now decrease to less than 3/10 and rest 0-1/10, increase AROM WNL except composite wrist flexion on R , great grip and prehension strength. Pt cont to have some thumb pain, edema and pain over L CT scar and bilateral UE decrease strength. Discuss with pt progress in scar tissue, ROM and strength. Pt to discuss with her work about job duties, light duty or other options for month or 2. Pt report  she has to be able to handle more than 50 lbs with her hands in her jobduty at work.   Pt cont to make great progress in  bilateral hand pain, strength  and bilateral UE strength. Initiated the last 3 sesssion more UB strengthening and conditioning. She is able to carry  6 lbs in L , 9 lbs on R - very light pull over thumbs and palms.  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    OT Occupational Profile and History Problem Focused Assessment - Including review of records relating to presenting problem    Occupational performance deficits (Please refer to evaluation for details): ADL's;IADL's;Rest and Sleep;Work;Play;Leisure;Social Participation    Body Structure / Function / Physical Skills ADL;Strength;Pain;UE functional  use;ROM;IADL;Scar mobility;Sensation;Decreased knowledge of precautions    Rehab Potential Good    Clinical Decision Making Limited treatment options, no task modification necessary    Comorbidities Affecting Occupational Performance: None    Modification or Assistance to Complete Evaluation  No modification of tasks or assist necessary to complete eval    OT Frequency 2x / week    OT Duration 4 weeks    OT Treatment/Interventions Self-care/ADL training;Paraffin;Ultrasound;Moist Heat;Fluidtherapy;Contrast Bath;Therapeutic exercise;Manual Therapy;Patient/family education;Passive range of motion;Scar mobilization;Therapeutic activities;Splinting;DME and/or AE instruction    Consulted and Agree with Plan of Care Patient             Patient will benefit from skilled therapeutic intervention in order to improve the following deficits and impairments:   Body Structure / Function / Physical  Skills: ADL, Strength, Pain, UE functional use, ROM, IADL, Scar mobility, Sensation, Decreased knowledge of precautions       Visit Diagnosis: Pain in left hand  Pain in left wrist  Pain in right hand  Pain in right wrist  Stiffness of right wrist, not elsewhere classified  Muscle weakness (generalized)  Stiffness of left wrist, not elsewhere classified    Problem List Patient Active Problem List   Diagnosis Date Noted   Adrenal insufficiency ([place]) 01/26/2021   Nausea vomiting and diarrhea 01/26/2021   Severe sepsis ([place]) 01/26/2021   Hypokalemia 01/26/2021   Asthma 01/26/2021   GERD (gastroesophageal reflux disease) 01/26/2021   Tobacco use 01/26/2021   Hypomagnesemia 01/26/2021   CAP (community acquired pneumonia) 01/26/2021   Carpal tunnel syndrome, right 08/19/2020   Tear of left acetabular labrum 05/06/2015   Other specified postprocedural states 03/23/2015   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 [place], [address] Phone: [phone]   Fax:  [phone]  Name: Margaret Gates MRN: [id number] Date of Birth: 12-09-1980

## 2021-03-06 ENCOUNTER — Other Ambulatory Visit: Payer: Self-pay

## 2021-03-06 ENCOUNTER — Ambulatory Visit: Payer: BC Managed Care – PPO | Admitting: Occupational Therapy

## 2021-03-06 DIAGNOSIS — M25631 Stiffness of right wrist, not elsewhere classified: Secondary | ICD-10-CM

## 2021-03-06 DIAGNOSIS — M25531 Pain in right wrist: Secondary | ICD-10-CM

## 2021-03-06 DIAGNOSIS — M79641 Pain in right hand: Secondary | ICD-10-CM

## 2021-03-06 DIAGNOSIS — M25532 Pain in left wrist: Secondary | ICD-10-CM

## 2021-03-06 DIAGNOSIS — M79642 Pain in left hand: Secondary | ICD-10-CM

## 2021-03-06 DIAGNOSIS — M25632 Stiffness of left wrist, not elsewhere classified: Secondary | ICD-10-CM

## 2021-03-06 DIAGNOSIS — M6281 Muscle weakness (generalized): Secondary | ICD-10-CM

## 2021-03-06 NOTE — Therapy (Signed)
[facility] [address], [place], [address] Phone: [phone]   Fax:  [phone]  Occupational Therapy Treatment  Patient Details  Name: Margaret Gates MRN: [id number] Date of Birth: 27-Sep-1980 Referring Provider (OT): Dr Roland Rack   Encounter Date: 03/06/2021   OT End of Session - 03/06/21 0939     Visit Number 8    Number of Visits 12    Date for OT Re-Evaluation 03/16/21    OT Start Time 0900    OT Stop Time 0945    OT Time Calculation (min) 45 min    Activity Tolerance Patient tolerated treatment well    Behavior During Therapy [facility] for tasks assessed/performed             Past Medical History:  Diagnosis Date   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HCC)    Asthma    Bronchitis    GERD (gastroesophageal reflux disease)    Headache    Tobacco use     Past Surgical History:  Procedure Laterality Date   CARPAL TUNNEL RELEASE Right 08/31/2020   Procedure: CARPAL TUNNEL RELEASE ENDOSCOPIC;  Surgeon: Corky Mull, MD;  Location: [facility] ORS;  Service: Orthopedics;  Laterality: Right;   CARPAL TUNNEL RELEASE Left 10/26/2020   Procedure: CARPAL TUNNEL RELEASE ENDOSCOPIC;  Surgeon: Corky Mull, MD;  Location: [facility] ORS;  Service: Orthopedics;  Laterality: Left;   CESAREAN SECTION     HIP SURGERY Bilateral    LAPROSCOPIC BONE SPURS    There were no vitals filed for this visit.   Subjective Assessment - 03/06/21 0902     Subjective  Numbness and pain better compare to prior to surgery , but weakness more since not working from June -  picking , carrying ,lifing, pushing and pulling the hardest - Has to do 50 lbs at work -went to talk to them but I have to be full duty to go back    Pertinent History Pt was seen by Dr Roland Rack on 01/23/21 for follow up  12 weeks status post an endoscopic left carpal tunnel release ( 10/26/20).  R CTR done 08/31/20. Overall, the patient feels that she is doing reasonably well.  However, she continues to experience moderate pain in her  L wrist which she rates at 5/10 on today's visit, and for which she will take ibuprofen and/or Tylenol as necessary with temporary partial relief of her symptoms. She localizes the pain to the aspect of her hand. In addition, she will continue to wear her brace at night as she finds this to be helpful in enabling her to sleep. She still notes difficulty lifting objects as well as some residual paresthesias to her fingertips. She denies any reinjury to the wrist or hand, and denies any fevers or chills. She has not yet returned to work, but does not feel that she can do the work that would be required of her, such as lifting 50 pounds.  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 Yes    Pain Score 3     Pain Location Hand   palms  and thumbs   Pain Orientation Right;Left    Pain Descriptors / Indicators  Sore    Pain Type Acute pain;Surgical pain    Pain Onset More than a month ago                [facility] OT Assessment - 03/06/21 0001       Strength   Right Hand Grip (lbs) 66    Right Hand Lateral Pinch 19 lbs    Right Hand 3 Point Pinch 16 lbs    Left Hand Grip (lbs) 50    Left Hand Lateral Pinch 18 lbs    Left Hand 3 Point Pinch 15 lbs              Great progress in prehension  strength again but somewhat decrease in grip -but still better than she was - see flow sheet  Tenderness over volar wrist or incision AROM WNL in bilateral hands and wrist -except composite wrist flexion /extention on R  Some soreness in thumbs - nad  bilateral webspace but less tightness  And decrease tenderness over distal radius - negative Finkelstein        OT Treatments/Exercises (OP) - 03/06/21 0001       RUE Paraffin   Number  Minutes Paraffin 8 Minutes    RUE Paraffin Location Hand    Comments prior to soft tissue      LUE Paraffin   Number Minutes Paraffin 8 Minutes    LUE Paraffin Location Hand;Wrist    Comments prior to soft tissue           Simulated with patient work activities today-picking up 20 pounds as well as 35 pounds-mildly different sizes  Patient able to carry and pick up 20 pounds in the palms and forearm-difficulty with pulling or pushing up to top shelf and bottom shelf.   Difficulty with 35 pounds limited by bilateral upper extremity and upper body weakness-out of work since June 2020. She could carry and lift 10 pounds with the right hand and 6 pounds with left hand with slight pull over thenar eminence and volar wrist.  Pt cont with paraffin  Soft tissue mobs done-  MC spreads , webspace   AAROM and light stretches  composite wrist flexion, ext  Scar massage to cont with      review again RTB for scapula retraction and shoulders extention , and biceps and tricepts  Increase to 3 x 10 reps - light grip  Pain free  1 x day  Wall pushups to start with  Pain with pushup up from chair -and pushing heavy door Pulling better           OT Education - 03/06/21 0939     Education Details progress, dry needling ed  and change to HEP    Person(s) Educated Patient    Methods Explanation;Demonstration;Tactile cues;Verbal cues;Handout    Comprehension Verbal cues required;Returned demonstration;Verbalized understanding              OT Short Term Goals - 02/02/21 1405       OT SHORT TERM GOAL #1   Title Pt and husband to be independent in HEP to decrease scar tissue and pain to less than 2/10  , and increase AROM    Baseline pain 8/10 R hand lifting, L 6-10/10 at rest to using - decrease wrist AROM and pain    Time 3    Period Weeks    Status New    Target Date 02/23/21  OT Long Term Goals - 02/02/21 1407       OT LONG TERM GOAL #1   Title  Bilateral wrist AROM increase to WNL and pain free to push up from chair , push and pull door open    Baseline decrease wrist AROM in all planes and pain    Time 3    Period Weeks    Status New    Target Date 02/23/21      OT LONG TERM GOAL #2   Title Pt' s bilateral  grip and prehension strenght increase within her range for her age to carry more than 10 lbs, push , pull  heavy door  without symptoms    Baseline Grip R51, L 31 pain , Lat grip R 13, L11 pain , 3 poin pinch R 9 , L 11 lbs pain -    Time 6    Period Weeks    Status New    Target Date 03/16/21      OT LONG TERM GOAL #3   Title Pain on PRWHE  outcome measure improve with more than 20 points    Baseline PRWHE pain score 38/50 for hand and wrist pain    Time 6    Period Weeks    Status New    Target Date 03/16/21                   Plan - 03/06/21 4034     Clinical Impression Statement Patient referred to OT postop left carpal tunnel endoscopy 10/26/20-patient reports she had August 22 right carpal tunnel.  Patient seen today for eighth visit made great progress in pain, scar tissue,range of motion and grip and prehention strength.  Patient continues to be limited by thumb pain and tenderness over volar carpal tunnel incisions left more than right-simulated work activities today in clinic with picking up 20 pounds and 35 pounds.  Has difficulty with pushing and pulling awkward size weight at work.  Prehention strength improved greatly again this past week, grip decrease some wthat  but still  increased- continued tenderness over carpal tunnel incision and volar wrist.  Was able to work on upper body and extremity conditioning the last 2-3 sessions.  Patient unable to go back to work light duty or in another job temp-can benefit from continued OT services to decrease pain increase motion and strength to return to prior level of function    OT Occupational Profile and History Problem Focused Assessment - Including review  of records relating to presenting problem    Occupational performance deficits (Please refer to evaluation for details): ADL's;IADL's;Rest and Sleep;Work;Play;Leisure;Social Participation    Body Structure / Function / Physical Skills ADL;Strength;Pain;UE functional use;ROM;IADL;Scar mobility;Sensation;Decreased knowledge of precautions    Rehab Potential Good    Clinical Decision Making Limited treatment options, no task modification necessary    Comorbidities Affecting Occupational Performance: None    Modification or Assistance to Complete Evaluation  No modification of tasks or assist necessary to complete eval    OT Frequency 2x / week    OT Duration 4 weeks    OT Treatment/Interventions Self-care/ADL training;Paraffin;Ultrasound;Moist Heat;Fluidtherapy;Contrast Bath;Therapeutic exercise;Manual Therapy;Patient/family education;Passive range of motion;Scar mobilization;Therapeutic activities;Splinting;DME and/or AE instruction    Consulted and Agree with Plan of Care Patient             Patient will benefit from skilled therapeutic intervention in order to improve the following deficits and impairments:   Body Structure / Function / Physical Skills: ADL,  Strength, Pain, UE functional use, ROM, IADL, Scar mobility, Sensation, Decreased knowledge of precautions       Visit Diagnosis: Pain in left hand  Pain in left wrist  Pain in right wrist  Stiffness of right wrist, not elsewhere classified  Stiffness of left wrist, not elsewhere classified  Pain in right hand  Muscle weakness (generalized)    Problem List Patient Active Problem List   Diagnosis Date Noted   Adrenal insufficiency ([place]) 01/26/2021   Nausea vomiting and diarrhea 01/26/2021   Severe sepsis ([place]) 01/26/2021   Hypokalemia 01/26/2021   Asthma 01/26/2021   GERD (gastroesophageal reflux disease) 01/26/2021   Tobacco use 01/26/2021   Hypomagnesemia 01/26/2021   CAP (community acquired pneumonia)  01/26/2021   Carpal tunnel syndrome, right 08/19/2020   Tear of left acetabular labrum 05/06/2015   Other specified postprocedural states 03/23/2015   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 [place], [address] Phone: [phone]   Fax:  [phone]  Name: Adisa Vigeant MRN: [id number] Date of Birth: 06-17-1980

## 2021-03-10 ENCOUNTER — Other Ambulatory Visit: Payer: Self-pay

## 2021-03-10 ENCOUNTER — Ambulatory Visit: Payer: BC Managed Care – PPO | Admitting: Occupational Therapy

## 2021-03-10 DIAGNOSIS — M79642 Pain in left hand: Secondary | ICD-10-CM

## 2021-03-10 DIAGNOSIS — M25631 Stiffness of right wrist, not elsewhere classified: Secondary | ICD-10-CM

## 2021-03-10 DIAGNOSIS — M79641 Pain in right hand: Secondary | ICD-10-CM

## 2021-03-10 DIAGNOSIS — M25632 Stiffness of left wrist, not elsewhere classified: Secondary | ICD-10-CM

## 2021-03-10 DIAGNOSIS — M25532 Pain in left wrist: Secondary | ICD-10-CM

## 2021-03-10 DIAGNOSIS — M25531 Pain in right wrist: Secondary | ICD-10-CM

## 2021-03-10 DIAGNOSIS — M6281 Muscle weakness (generalized): Secondary | ICD-10-CM

## 2021-03-10 NOTE — Therapy (Signed)
[place] [address] [place], [address] Phone: [phone]   Fax:  [phone]  Occupational Therapy Treatment  Patient Details  Name: Margaret Gates MRN: [id number] Date of Birth: 09-30-1980 Referring Provider (OT): Dr Roland Rack   Encounter Date: 03/10/2021   OT End of Session - 03/10/21 1000     Visit Number 9    Number of Visits 12    Date for OT Re-Evaluation 03/16/21    OT Start Time 0831    OT Stop Time 0915    OT Time Calculation (min) 44 min    Activity Tolerance Patient tolerated treatment well    Behavior During Therapy [facility] for tasks assessed/performed             Past Medical History:  Diagnosis Date   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[place])    Asthma    Bronchitis    GERD (gastroesophageal reflux disease)    Headache    Tobacco use     Past Surgical History:  Procedure Laterality Date   CARPAL TUNNEL RELEASE Right 08/31/2020   Procedure: CARPAL TUNNEL RELEASE ENDOSCOPIC;  Surgeon: Corky Mull, MD;  Location: [facility] ORS;  Service: Orthopedics;  Laterality: Right;   CARPAL TUNNEL RELEASE Left 10/26/2020   Procedure: CARPAL TUNNEL RELEASE ENDOSCOPIC;  Surgeon: Corky Mull, MD;  Location: [facility] ORS;  Service: Orthopedics;  Laterality: Left;   CESAREAN SECTION     HIP SURGERY Bilateral    LAPROSCOPIC BONE SPURS    There were no vitals filed for this visit.   Subjective Assessment - 03/10/21 0858     Subjective  Dr Roland Rack I seen and he told me to cont with you another month - for strengthening - my thumb really hurt in here when driving and gripping    Pertinent History Pt was seen by Dr Roland Rack on 01/23/21 for follow up  12 weeks status post an endoscopic left carpal tunnel release ( 10/26/20).  R CTR done 08/31/20. Overall, the patient feels that she is doing reasonably well. However, she continues to experience moderate pain in her  L wrist which she rates at 5/10 on today's visit, and for  which she will take ibuprofen and/or Tylenol as necessary with temporary partial relief of her symptoms. She localizes the pain to the aspect of her hand. In addition, she will continue to wear her brace at night as she finds this to be helpful in enabling her to sleep. She still notes difficulty lifting objects as well as some residual paresthesias to her fingertips. She denies any reinjury to the wrist or hand, and denies any fevers or chills. She has not yet returned to work, but does not feel that she can do the work that would be required of her, such as lifting 50 pounds.  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 Yes    Pain Score 2     Pain Location Hand    Pain Orientation Right;Left    Pain Descriptors / Indicators Aching    Pain Type Surgical pain                   Continue to have  issues with pain and tenderness in bilateral thenar eminence Negative for grinding test on bilateral thumbs Pain with thumb flexion and opposition No pain with thumb palmar radial abduction Observe patient pinching weight with abduction of thumb as well as Thera-Band Patient educated on modifications and use of grip and palm and not overuse of lateral grip or pinch       OT Treatments/Exercises (OP) - 03/10/21 0001       RUE Paraffin   Number Minutes Paraffin 8 Minutes    RUE Paraffin Location Hand    Comments prior to soft tissue and ROM      LUE Paraffin   Number Minutes Paraffin 8 Minutes    LUE Paraffin Location Hand;Wrist    Comments prior t osoft tissue and ROM                Pt cont with paraffin  Soft tissue mobs done-  MC spreads , webspace   AAROM and light stretches  composite wrist flexion, ext  Scar massage to cont with  Table slides 20 reps  Wall pushups  - 2 x 10reps Shoulder Y off wall -lats - no weight  1 lbs 10 reps each Shoulder stabilization exercises bilateral with 1 kg ball on wall 1 minute each  Biodex triceps 10 pounds 15 reps Shoulder retraction or rows 15 reps 10 pounds Chest press 10 pounds pain over thenar eminence    Upgrade patient to green Thera-Band   for scapula retraction and shoulders extention , and biceps and tricepts  Increase to1 x 10 reps - light grip (reinforced with patient not to do thumb abduction or lateral pinch with Thera-Band-keep band through index and second digits0 Pain free  1 x day               OT Education - 03/10/21 0959     Education Details progress,   and change to HEP    Person(s) Educated Patient    Methods Explanation;Demonstration;Tactile cues;Verbal cues;Handout    Comprehension Verbal cues required;Returned demonstration;Verbalized understanding              OT Short Term Goals - 02/02/21 1405       OT SHORT TERM GOAL #1   Title Pt and husband to be independent in HEP to decrease scar tissue and pain to less than 2/10  , and increase AROM    Baseline pain 8/10 R hand lifting, L 6-10/10 at rest to using - decrease wrist AROM and pain    Time 3    Period Weeks    Status New    Target Date 02/23/21               OT Long Term Goals - 02/02/21 1407       OT LONG TERM GOAL #1   Title Bilateral wrist AROM increase to WNL and pain free to push up from chair , push and pull door open    Baseline decrease wrist AROM in all planes and pain    Time 3    Period Weeks    Status New    Target Date 02/23/21      OT LONG TERM GOAL #2   Title Pt' s bilateral  grip and prehension strenght increase within her range for her age to carry more than 10 lbs, push , pull  heavy door  without symptoms    Baseline Grip R51, L 31 pain , Lat grip R 13, L11 pain , 3  poin pinch R 9 , L 11 lbs pain -    Time 6    Period Weeks    Status New    Target Date 03/16/21      OT LONG  TERM GOAL #3   Title Pain on PRWHE  outcome measure improve with more than 20 points    Baseline PRWHE pain score 38/50 for hand and wrist pain    Time 6    Period Weeks    Status New    Target Date 03/16/21                   Plan - 03/10/21 1000     Clinical Impression Statement Patient referred to OT postop left carpal tunnel endoscopy 10/26/20-patient reports she had August 22 right carpal tunnel.  Patient making great progress in pain, scar tissue,range of motion and grip and prehention strength.  Patient continues to be limited by thumb pain and tenderness over  thenar emininence- pain flexors and ADD of thumb over use of lat pinch  - Focusing on strengthening for bilateral UE - able to do pushups today on wall , increase to GTB for shoulders but change pt's grip to not do lat pinch. Biodex done 10lbs limited in chest press by tenderness over Thenar eminence.  Patient unable to go back to work light duty or in another job temp-can benefit from continued OT services to decrease pain increase motion and strength to return to prior level of function    OT Occupational Profile and History Problem Focused Assessment - Including review of records relating to presenting problem    Occupational performance deficits (Please refer to evaluation for details): ADL's;IADL's;Rest and Sleep;Work;Play;Leisure;Social Participation    Body Structure / Function / Physical Skills ADL;Strength;Pain;UE functional use;ROM;IADL;Scar mobility;Sensation;Decreased knowledge of precautions    Rehab Potential Good    Clinical Decision Making Limited treatment options, no task modification necessary    Comorbidities Affecting Occupational Performance: None    Modification or Assistance to Complete Evaluation  No modification of tasks or assist necessary to complete eval    OT Frequency 2x / week    OT Duration 4 weeks    OT Treatment/Interventions Self-care/ADL training;Paraffin;Ultrasound;Moist  Heat;Fluidtherapy;Contrast Bath;Therapeutic exercise;Manual Therapy;Patient/family education;Passive range of motion;Scar mobilization;Therapeutic activities;Splinting;DME and/or AE instruction    Consulted and Agree with Plan of Care Patient             Patient will benefit from skilled therapeutic intervention in order to improve the following deficits and impairments:   Body Structure / Function / Physical Skills: ADL, Strength, Pain, UE functional use, ROM, IADL, Scar mobility, Sensation, Decreased knowledge of precautions       Visit Diagnosis: Pain in left hand  Pain in left wrist  Pain in right wrist  Stiffness of right wrist, not elsewhere classified  Stiffness of left wrist, not elsewhere classified  Pain in right hand  Muscle weakness (generalized)    Problem List Patient Active Problem List   Diagnosis Date Noted   Adrenal insufficiency ([place]) 01/26/2021   Nausea vomiting and diarrhea 01/26/2021   Severe sepsis ([place]) 01/26/2021   Hypokalemia 01/26/2021   Asthma 01/26/2021   GERD (gastroesophageal reflux disease) 01/26/2021   Tobacco use 01/26/2021   Hypomagnesemia 01/26/2021   CAP (community acquired pneumonia) 01/26/2021   Carpal tunnel syndrome, right 08/19/2020   Tear of left acetabular labrum 05/06/2015   Other specified postprocedural states 03/23/2015   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 [place], [address] Phone: [phone]   Fax:  [phone]  Name: Jamieka Royle MRN: [id number] Date of Birth: March 23, 1980

## 2021-03-13 ENCOUNTER — Encounter: Payer: Self-pay | Admitting: Nurse Practitioner

## 2021-03-14 ENCOUNTER — Ambulatory Visit: Payer: BC Managed Care – PPO | Admitting: Occupational Therapy

## 2021-03-14 ENCOUNTER — Ambulatory Visit: Payer: BC Managed Care – PPO | Admitting: Family Medicine

## 2021-03-14 ENCOUNTER — Encounter: Payer: Self-pay | Admitting: Family Medicine

## 2021-03-14 ENCOUNTER — Other Ambulatory Visit: Payer: Self-pay

## 2021-03-14 VITALS — BP 116/78 | HR 100 | Temp 98.7°F | Resp 16 | Ht 65.0 in | Wt 172.4 lb

## 2021-03-14 DIAGNOSIS — J069 Acute upper respiratory infection, unspecified: Secondary | ICD-10-CM | POA: Diagnosis not present

## 2021-03-14 DIAGNOSIS — R599 Enlarged lymph nodes, unspecified: Secondary | ICD-10-CM | POA: Diagnosis not present

## 2021-03-14 DIAGNOSIS — M79642 Pain in left hand: Secondary | ICD-10-CM | POA: Diagnosis not present

## 2021-03-14 DIAGNOSIS — M6281 Muscle weakness (generalized): Secondary | ICD-10-CM

## 2021-03-14 DIAGNOSIS — M25632 Stiffness of left wrist, not elsewhere classified: Secondary | ICD-10-CM

## 2021-03-14 DIAGNOSIS — M25531 Pain in right wrist: Secondary | ICD-10-CM

## 2021-03-14 DIAGNOSIS — M25631 Stiffness of right wrist, not elsewhere classified: Secondary | ICD-10-CM

## 2021-03-14 DIAGNOSIS — M25532 Pain in left wrist: Secondary | ICD-10-CM

## 2021-03-14 DIAGNOSIS — M79641 Pain in right hand: Secondary | ICD-10-CM

## 2021-03-14 NOTE — Therapy (Signed)
[facility] ?[address] ?[place], [place], [address] ?Phone: [phone]   Fax:  [phone] ? ?Occupational Therapy Treatment/Progress note 10 visits ? ?Patient Details  ?Name: Margaret Gates ?MRN: [id number] ?Date of Birth: [birth year] ?Referring Provider (OT): Dr Roland Rack ? ? ?Encounter Date: 03/14/2021 ? ? OT End of Session - 03/14/21 0947   ? ? Visit Number 10   ? Number of Visits 12   ? Date for OT Re-Evaluation 03/16/21   ? OT Start Time 0945   ? OT Stop Time 1030   ? OT Time Calculation (min) 45 min   ? Activity Tolerance Patient tolerated treatment well   ? Behavior During Therapy [facility] for tasks assessed/performed   ? ?  ?  ? ?  ? ? ?Past Medical History:  ?Diagnosis Date  ? 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 Asthma   ? Bronchitis   ? GERD (gastroesophageal reflux disease)   ? Headache   ? Severe sepsis ([place]) 01/26/2021  ? Tobacco use   ? ? ?Past Surgical History:  ?Procedure Laterality Date  ? CARPAL TUNNEL RELEASE Right 08/31/2020  ? Procedure: CARPAL TUNNEL RELEASE ENDOSCOPIC;  Surgeon: Corky Mull, MD;  Location: [facility] ORS;  Service: Orthopedics;  Laterality: Right;  ? CARPAL TUNNEL RELEASE Left 10/26/2020  ? Procedure: CARPAL TUNNEL RELEASE ENDOSCOPIC;  Surgeon: Corky Mull, MD;  Location: [facility] ORS;  Service: Orthopedics;  Laterality: Left;  ? CESAREAN SECTION    ? HIP SURGERY Bilateral   ? LAPROSCOPIC BONE SPURS  ? ? ?There were no vitals filed for this visit. ? ? Subjective Assessment - 03/14/21 0947   ? ? Subjective  No pain , thumbs still bothering me little -and not wearing my splints since last time- still picking up with my palms- still weak   ? Pertinent History Pt was seen by Dr Roland Rack on 01/23/21 for follow up  12 weeks status post an endoscopic left carpal tunnel release ( 10/26/20).  R CTR done 08/31/20. Overall, the patient feels that she is doing reasonably well. However, she continues to experience moderate pain in her  L wrist  which she rates at 5/10 on today's visit, and for which she will take ibuprofen and/or Tylenol as necessary with temporary partial relief of her symptoms. She localizes the pain to the aspect of her hand. In addition, she will continue to wear her brace at night as she finds this to be helpful in enabling her to sleep. She still notes difficulty lifting objects as well as some residual paresthesias to her fingertips. She denies any reinjury to the wrist or hand, and denies any fevers or chills. She has not yet returned to work, but does not feel that she can do the work that would be required of her, such as lifting 50 pounds.  Pt refer to OT   ? 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 Currently in Pain? No/denies   ? ?  ?  ? ?  ? ? ? ? ?  Patient reports pain in thumb thenar eminence better compared to last time.  Still some tenderness over the palm especially with wall push-ups ?Doing okay no problems using green Thera-Band and 3 and 1  pound weight ?Reports still having trouble with cutting with scissors or knife, and opening small packages using lateral pinch ? ?  ?Pt cont with paraffin at home ?Soft tissue mobs done-  MC spreads , webspace  and stone massage tool over volar forearm and palm ? AAROM and light stretches  composite wrist flexion, ext  ?Scar massage to cont with  ?Table slides 20 reps ? Wall pushups - 2 x 10reps ?Shoulder stabilization exercises bilateral with 1 kg ball on wall 1 minute each ?Throw one hand and catch both - 1 min each on rebounder  ?  ?Biodex triceps 10 pounds 2 x 12 reps reps ?Shoulder /scapula rows  2 x 12 reps 10 pounds ?Chest press 5 pounds less pain ?  ?  ?Cont with  green Thera-Band   for scapula retraction and shoulders extention , and biceps and tricepts  ? 1 -2 x 10 reps - light grip (reinforced with  patient not to do thumb abduction or lateral pinch with Thera-Band-keep band through index and second digits0 ?Pain free  ?1 x day  ?  ? ?UBE 5 min - change fw and bw every min - tolerate well - fatigue  ? ? ? ? ? ? ? ? ? ? ? ? ? ? OT Education - 03/14/21 0947   ? ? Education Details progress,   and change to HEP   ? Person(s) Educated Patient   ? Methods Explanation;Demonstration;Tactile cues;Verbal cues;Handout   ? Comprehension Verbal cues required;Returned demonstration;Verbalized understanding   ? ?  ?  ? ?  ? ? ? OT Short Term Goals - 02/02/21 1405   ? ?  ? OT SHORT TERM GOAL #1  ? Title Pt and husband to be independent in HEP to decrease scar tissue and pain to less than 2/10  , and increase AROM   ? Baseline pain 8/10 R hand lifting, L 6-10/10 at rest to using - decrease wrist AROM and pain   ? Time 3   ? Period Weeks   ? Status New   ? Target Date 02/23/21   ? ?  ?  ? ?  ? ? ? ? OT Long Term Goals - 02/02/21 1407   ? ?  ? OT LONG TERM GOAL #1  ? Title Bilateral wrist AROM increase to WNL and pain free to push up from chair , push and pull door open   ? Baseline decrease wrist AROM in all planes and pain   ? Time 3   ? Period Weeks   ? Status New   ? Target Date 02/23/21   ?  ? OT LONG TERM GOAL #2  ? Title Pt' s bilateral  grip and prehension strenght increase within her range for her age to carry more than 10 lbs, push , pull  heavy door  without symptoms   ? Baseline Grip R51, L 31 pain , Lat grip R 13, L11 pain , 3 poin pinch R 9 , L 11 lbs pain -   ? Time 6   ? Period Weeks   ? Status New   ? Target Date 03/16/21   ?  ? OT LONG TERM GOAL #3  ? Title Pain on PRWHE  outcome measure improve with more than 20 points   ? Baseline PRWHE pain score 38/50 for hand and wrist pain   ? Time 6   ? Period Weeks   ? Status New   ? Target Date 03/16/21   ? ?  ?  ? ?  ? ? ? ? ? ? ? ?  Plan - 03/14/21 0947   ? ? Clinical Impression Statement Patient referred to OT postop left carpal tunnel endoscopy 10/26/20-patient  reports she had August 22 right carpal tunnel.  Patient making great progress in pain, scar tissue,range of motion and grip and prehention strength.  Patient continues to be limited by thumb pain and tenderness over  palm with pressure and weight  - cont to have tightness in R thumb ADD and flexors of forearms.Soing better with gripping Theraband not with thumb ADD.  Focusing on strengthening for bilateral UE - able to do pushups on wall , increase to GTB for shoulders but change pt's grip to not do lat pinch. Biodex done 5-10lbs limited in chest press by tenderness over  palm and CT scar still.  Patient unable to go back to work light duty or in another job temp-can benefit from continued OT services to decrease pain increase motion and strength to return to prior level of function   ? OT Occupational Profile and History Problem Focused Assessment - Including review of records relating to presenting problem   ? Occupational performance deficits (Please refer to evaluation for details): ADL's;IADL's;Rest and Sleep;Work;Play;Leisure;Social Participation   ? Body Structure / Function / Physical Skills ADL;Strength;Pain;UE functional use;ROM;IADL;Scar mobility;Sensation;Decreased knowledge of precautions   ? Rehab Potential Good   ? Clinical Decision Making Limited treatment options, no task modification necessary   ? Comorbidities Affecting Occupational Performance: None   ? Modification or Assistance to Complete Evaluation  No modification of tasks or assist necessary to complete eval   ? OT Frequency 2x / week   ? OT Duration 4 weeks   ? OT Treatment/Interventions Self-care/ADL training;Paraffin;Ultrasound;Moist Heat;Fluidtherapy;Contrast Bath;Therapeutic exercise;Manual Therapy;Patient/family education;Passive range of motion;Scar mobilization;Therapeutic activities;Splinting;DME and/or AE instruction   ? Consulted and Agree with Plan of Care Patient   ? ?  ?  ? ?  ? ? ?Patient will benefit from skilled therapeutic  intervention in order to improve the following deficits and impairments:   ?Body Structure / Function / Physical Skills: ADL, Strength, Pain, UE functional use, ROM, IADL, Scar mobility, Sensation, Decreased

## 2021-03-14 NOTE — Progress Notes (Signed)
? ?  SUBJECTIVE:  ? ?CHIEF COMPLAINT / HPI:  ? ?LUMP ?Duration:  few days ?Location: L upper neck ?Painful: tender to touch and with coughing. ?Status:  smaller ?Trauma: no ?Redness: no ?Bruising: no ?Recent infection: yes ?Swollen lymph nodes: yes ?History of cancer: no ?Family history of cancer: dad with h/o Hodgkin's lymphoma, living. Aunt with breast cancer.  ?History of the same: no ?Associated signs and symptoms: cough, congestion, runny nose, fatigue ?Denies SOB, fevers, other lumps/bumps, night sweats. ?+Sick contacts - son with similar symptoms. 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  ?Has not had to use albuterol. ? ? ?OBJECTIVE:  ? ?BP 116/78   Pulse 100   Temp 98.7 ?F (37.1 ?C)   Resp 16   Ht '5\' 5"'$  (1.651 m)   Wt 172 lb 6.4 oz (78.2 kg)   LMP 03/12/2021   SpO2 98%   BMI 28.69 kg/m?   ?Gen: well appearing, in NAD ?HEENT: orophyarynx clear without exudate or erythema. Uvula midline. No tonsillar enlargement. Good dentition. Slightly enlarged upper anterior cervical lymph node, TTP. No or supraclavicular, auricular, posterior cervical or occipital lymphadenopathy. ?Card: RRR ?Lungs: CTAB ?Ext: WWP, no edema ? ? ?ASSESSMENT/PLAN:  ? ?Enlarged lymph node ?Likely reactive given concurrent viral URI. Reassurance provided. Return precautions discussed. ? ?Viral URI ?Doing well with mild sx. Reviewed OTC symptom relief and return precautions.  ? ? ?Myles Gip, DO ?

## 2021-03-16 ENCOUNTER — Ambulatory Visit: Payer: BC Managed Care – PPO | Admitting: Occupational Therapy

## 2021-03-16 ENCOUNTER — Other Ambulatory Visit: Payer: Self-pay

## 2021-03-16 DIAGNOSIS — M79642 Pain in left hand: Secondary | ICD-10-CM | POA: Diagnosis not present

## 2021-03-16 NOTE — Therapy (Signed)
[place] [facility] ?[address] ?[place], [place], [address] ?Phone: [phone]   Fax:  [phone] ? ?Occupational Therapy Treatment ? ?Patient Details  ?Name: Margaret Gates ?MRN: [id number] ?Date of Birth: 09-18-1980 ?Referring Provider (OT): Dr Roland Rack ? ? ?Encounter Date: 03/16/2021 ? ? OT End of Session - 03/16/21 0903   ? ? Visit Number 11   ? Number of Visits 12   ? Date for OT Re-Evaluation 03/16/21   ? OT Start Time [phone]   ? OT Stop Time 0940   ? OT Time Calculation (min) 38 min   ? Activity Tolerance Patient tolerated treatment well   ? Behavior During Therapy [facility] for tasks assessed/performed   ? ?  ?  ? ?  ? ? ?Past Medical History:  ?Diagnosis Date  ? 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 Asthma   ? Bronchitis   ? GERD (gastroesophageal reflux disease)   ? Headache   ? Severe sepsis ([place]) 01/26/2021  ? Tobacco use   ? ? ?Past Surgical History:  ?Procedure Laterality Date  ? CARPAL TUNNEL RELEASE Right 08/31/2020  ? Procedure: CARPAL TUNNEL RELEASE ENDOSCOPIC;  Surgeon: Corky Mull, MD;  Location: [facility];  Service: Orthopedics;  Laterality: Right;  ? CARPAL TUNNEL RELEASE Left 10/26/2020  ? Procedure: CARPAL TUNNEL RELEASE ENDOSCOPIC;  Surgeon: Corky Mull, MD;  Location: [facility];  Service: Orthopedics;  Laterality: Left;  ? CESAREAN SECTION    ? HIP SURGERY Bilateral   ? LAPROSCOPIC BONE SPURS  ? ? ?There were no vitals filed for this visit. ? ? Subjective Assessment - 03/16/21 0903   ? ? Subjective  Doing okay - not as sore as I was and thumbs better to- wrist just stiff   ? Pertinent History Pt was seen by Dr Roland Rack on 01/23/21 for follow up  12 weeks status post an endoscopic left carpal tunnel release ( 10/26/20).  R CTR done 08/31/20. Overall, the patient feels that she is doing reasonably well. However, she continues to experience moderate pain in her  L wrist which she rates at 5/10 on today's visit, and for which she will take ibuprofen  and/or Tylenol as necessary with temporary partial relief of her symptoms. She localizes the pain to the aspect of her hand. In addition, she will continue to wear her brace at night as she finds this to be helpful in enabling her to sleep. She still notes difficulty lifting objects as well as some residual paresthesias to her fingertips. She denies any reinjury to the wrist or hand, and denies any fevers or chills. She has not yet returned to work, but does not feel that she can do the work that would be required of her, such as lifting 50 pounds.  Pt refer to OT   ? 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 Currently in Pain? No/denies   ? ?  ?  ? ?  ? ? ? ? ? ? Patient reports pain in thumb thenar eminence better compared to last time.  Patient reports less tenderness over palms and and thumbs communion.  ?Also less tenderness and pain in thumbs and palm with Biodex weight of 10 pounds and wall push-ups  ?Doing okay  no problems using green Thera-Band  ?At and reviewed with the patient medium teal putty for twisting, back-and-forth-pulling with all digits ?Patient to do 2 sets of 10 pain-free once a day-to increase ease with opening jars, turning doorknobs ?Reports still having trouble with cutting with scissors or knife, and opening small packages using lateral pinch ?  ?  ?Pt cont with paraffin at home ?Soft tissue mobs done-  MC spreads , webspace  and stone massage tool over volar forearm and palm ? AAROM and light stretches  composite wrist flexion, ext  ?Scar massage to cont with  ?Table slides 20 reps ? Wall pushups - 2 x 10reps ?Shoulder stabilization exercises bilateral with 1 kg ball on wall 1 minute each ?Throw one hand and catch both - 1 min each on rebounder  ?2 kg ball throw and catch both hands 1 min  ?  ?Biodex triceps 10 pounds 2  x 12 reps reps ?Shoulder /scapula rows  2 x 12 reps 10 pounds ?Chest press 2 x10 pounds less pain ?Rowing 10 lbs 2x 10 reps ? ?Off wall doing Y for scapula - 2 x 10 reps - 1 lbs  ?5 lbs trunk rotation with chest press out - both ways - weaker on the L - 12 reps  ?  ?  ?Cont with  green Thera-Band   for scapula retraction and shoulders extention , and biceps and tricepts  ? 1 -2 x 10 reps - light grip (reinforced with patient not to do thumb abduction or lateral pinch with Thera-Band-keep band through index and second digits0 ?Add this date horizontal ABD 2 x 10 reps ?Pain free  ?1 x day  ?  ?  ? ?  ?  ? ? ? ? ? ? ? ? ? ? ? ? ? ? ? ? ? OT Education - 03/16/21 0903   ? ? Education Details progress,   and change to HEP   ? Person(s) Educated Patient   ? Methods Explanation;Demonstration;Tactile cues;Verbal cues;Handout   ? Comprehension Verbal cues required;Returned demonstration;Verbalized understanding   ? ?  ?  ? ?  ? ? ? OT Short Term Goals - 02/02/21 1405   ? ?  ? OT SHORT TERM GOAL #1  ? Title Pt and husband to be independent in HEP to decrease scar tissue and pain to less than 2/10  , and increase AROM   ? Baseline pain 8/10 R hand lifting, L 6-10/10 at rest to using - decrease wrist AROM and pain   ? Time 3   ? Period Weeks   ? Status New   ? Target Date 02/23/21   ? ?  ?  ? ?  ? ? ? ? OT Long Term Goals - 02/02/21 1407   ? ?  ? OT LONG TERM GOAL #1  ? Title Bilateral wrist AROM increase to WNL and pain free to push up from chair , push and pull door open   ? Baseline decrease wrist AROM in all planes and pain   ? Time 3   ? Period Weeks   ? Status New   ? Target Date 02/23/21   ?  ? OT LONG TERM GOAL #2  ? Title Pt' s bilateral  grip and prehension strenght increase within her range for her age to carry more than 10 lbs, push , pull  heavy door  without symptoms   ? Baseline Grip R51, L 31 pain , Lat grip R 13, L11 pain ,  3 poin pinch R 9 , L 11 lbs pain -   ? Time 6   ? Period Weeks   ? Status New   ? Target  Date 03/16/21   ?  ? OT LONG TERM GOAL #3  ? Title Pain on PRWHE  outcome measure improve with more than 20 points   ? Baseline PRWHE pain score 38/50 for hand and wrist pain   ? Time 6   ? Period Weeks   ? Status New   ? Target Date 03/16/21   ? ?  ?  ? ?  ? ? ? ? ? ? ? ? Plan - 03/16/21 0903   ? ? Clinical Impression Statement Patient referred to OT postop left carpal tunnel endoscopy 10/26/20-patient reports she had August 22 right carpal tunnel.  Patient making great progress in pain, scar tissue,range of motion and grip and prehention strength.  Patient continues to be limited by thumb pain and tenderness over  palm with pressure and weight but able to tolerate now 10 lbs with ease   - cont to have tightness in R thumb ADD and flexors of forearms but improving. .Doing better with gripping Theraband not with thumb ADD.  Focusing on strengthening for bilateral UE - able to do pushups on wall  and using 1 lbs over head. Using GTB for shoulders but change pt's grip to not do lat pinch. Biodex done 10lbs  but improving - this date little weaker in ADD horizontal on L - did 5 lbs . Was able to do 1 kg ball single hand and 2 kg bal bilateral.   Patient unable to go back to work light duty or in another job temp-can benefit from continued OT services to decrease pain increase motion and strength to return to prior level of function   ? OT Occupational Profile and History Problem Focused Assessment - Including review of records relating to presenting problem   ? Occupational performance deficits (Please refer to evaluation for details): ADL's;IADL's;Rest and Sleep;Work;Play;Leisure;Social Participation   ? Body Structure / Function / Physical Skills ADL;Strength;Pain;UE functional use;ROM;IADL;Scar mobility;Sensation;Decreased knowledge of precautions   ? Rehab Potential Good   ? Clinical Decision Making Limited treatment options, no task modification necessary   ? Comorbidities Affecting Occupational Performance: None    ? Modification or Assistance to Complete Evaluation  No modification of tasks or assist necessary to complete eval   ? OT Frequency 2x / week   ? OT Duration 4 weeks   ? OT Treatment/Interventions Self

## 2021-03-21 ENCOUNTER — Ambulatory Visit: Payer: BC Managed Care – PPO | Admitting: Occupational Therapy

## 2021-03-21 ENCOUNTER — Other Ambulatory Visit: Payer: Self-pay

## 2021-03-21 DIAGNOSIS — M25532 Pain in left wrist: Secondary | ICD-10-CM

## 2021-03-21 DIAGNOSIS — M6281 Muscle weakness (generalized): Secondary | ICD-10-CM

## 2021-03-21 DIAGNOSIS — M25531 Pain in right wrist: Secondary | ICD-10-CM

## 2021-03-21 DIAGNOSIS — M79642 Pain in left hand: Secondary | ICD-10-CM

## 2021-03-21 DIAGNOSIS — M79641 Pain in right hand: Secondary | ICD-10-CM

## 2021-03-21 NOTE — Therapy (Signed)
[facility] ?[address] ?[place], [place], [address] ?Phone: [phone]   Fax:  [phone] ? ?Occupational Therapy Treatment ? ?Patient Details  ?Name: Margaret Gates ?MRN: [id number] ?Date of Birth: Sep 24, 1980 ?Referring Provider (OT): Dr Roland Rack ? ? ?Encounter Date: 03/21/2021 ? ? OT End of Session - 03/21/21 0949   ? ? Visit Number 12   ? Number of Visits 18   ? Date for OT Re-Evaluation 04/11/21   ? OT Start Time [phone]   ? OT Stop Time 1030   ? OT Time Calculation (min) 44 min   ? Activity Tolerance Patient tolerated treatment well   ? Behavior During Therapy [facility] for tasks assessed/performed   ? ?  ?  ? ?  ? ? ?Past Medical History:  ?Diagnosis Date  ? 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 Asthma   ? Bronchitis   ? GERD (gastroesophageal reflux disease)   ? Headache   ? Severe sepsis ([place]) 01/26/2021  ? Tobacco use   ? ? ?Past Surgical History:  ?Procedure Laterality Date  ? CARPAL TUNNEL RELEASE Right 08/31/2020  ? Procedure: CARPAL TUNNEL RELEASE ENDOSCOPIC;  Surgeon: Corky Mull, MD;  Location: [facility] ORS;  Service: Orthopedics;  Laterality: Right;  ? CARPAL TUNNEL RELEASE Left 10/26/2020  ? Procedure: CARPAL TUNNEL RELEASE ENDOSCOPIC;  Surgeon: Corky Mull, MD;  Location: [facility] ORS;  Service: Orthopedics;  Laterality: Left;  ? CESAREAN SECTION    ? HIP SURGERY Bilateral   ? LAPROSCOPIC BONE SPURS  ? ? ?There were no vitals filed for this visit. ? ? Subjective Assessment - 03/21/21 0949   ? ? Subjective  My R wrist had been bothering me more because I using it as well as strengthening using my bands and weights   ? Pertinent History Pt was seen by Dr Roland Rack on 01/23/21 for follow up  12 weeks status post an endoscopic left carpal tunnel release ( 10/26/20).  R CTR done 08/31/20. Overall, the patient feels that she is doing reasonably well. However, she continues to experience moderate pain in her  L wrist which she rates at 5/10 on today's visit, and  for which she will take ibuprofen and/or Tylenol as necessary with temporary partial relief of her symptoms. She localizes the pain to the aspect of her hand. In addition, she will continue to wear her brace at night as she finds this to be helpful in enabling her to sleep. She still notes difficulty lifting objects as well as some residual paresthesias to her fingertips. She denies any reinjury to the wrist or hand, and denies any fevers or chills. She has not yet returned to work, but does not feel that she can do the work that would be required of her, such as lifting 50 pounds.  Pt refer to OT   ? 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 Currently in Pain? Yes   ? Pain Score 4    ? Pain Location Wrist   ? Pain Orientation Right;Left   ? Pain Descriptors / Indicators Aching   ? Pain Type Surgical pain   ? ?  ?  ? ?  ? ? ? ? ? ? ? ? ? ? ? ? ? ? ?  OT Treatments/Exercises (OP) - 03/21/21 0001   ? ?  ? RUE Paraffin  ? Number Minutes Paraffin 8 Minutes   ? RUE Paraffin Location Hand   ? Comments decrease pain   ?  ? LUE Paraffin  ? Number Minutes Paraffin 8 Minutes   ? LUE Paraffin Location Hand;Wrist   ? Comments decrease pain   ? ?  ?  ? ?  ? ?Increase pain and soreness in R wrist using hand at home and HEP ?Doing okay no problems using green Thera-Band  ? Cont medium teal putty for twisting, back-and-forth-pulling with all digits ?Patient to do 2 sets of 10 pain-free once a day-to increase ease with opening jars, turning doorknobs ?Reports still having trouble with cutting with scissors or knife, and opening small packages using lateral pinch ?  ?  ?Pt cont with paraffin at home  ?Soft tissue mobs done-  MC spreads , webspace   ?- husband to help at home  ? AAROM and light stretches  composite wrist flexion, ext  ?Scar massage to cont with  ?Throw  bilateral 1 kg ball rebounder 1 min  ?  ?Biodex triceps 10 pounds 2 x 12 reps reps ?Shoulder /scapula rows  2 x 12 reps 10 pounds ? ?Rowing 10 lbs 2x 10 reps ?  ?Off wall doing Y for scapula - 2 x 10 reps - 1 lbs  ?5 lbs trunk rotation with chest press out - both ways - weaker on the L - 12 reps  ?Nustep 5 min level 3  ?  ?  ?Cont with  green Thera-Band   for scapula retraction and shoulders extention , and biceps and tricepts  ? 1 -2 x 10 reps - light grip (reinforced with patient not to do thumb abduction or lateral pinch with Thera-Band-keep band through index and second digits0 ?Add this date horizontal ABD 2 x 10 reps ?Pain free  ?1 x day  ?Hold off on putty until next time  ?  ?  ? ? ? ? ? ? ? OT Education - 03/21/21 0949   ? ? Education Details progress,   and change to HEP   ? Person(s) Educated Patient   ? Methods Explanation;Demonstration;Tactile cues;Verbal cues;Handout   ? Comprehension Verbal cues required;Returned demonstration;Verbalized understanding   ? ?  ?  ? ?  ? ? ? ? ? ? ? OT Long Term Goals - 02/02/21 1407   ? ?  ? OT LONG TERM GOAL #1  ? Title Bilateral wrist AROM increase to WNL and pain free to push up from chair , push and pull door open   ? Baseline decrease wrist AROM in all planes and pain   ? Time 3   ? Period Weeks   ? Status New   ? Target Date 02/23/21   ?  ? OT LONG TERM GOAL #2  ? Title Pt' s bilateral  grip and prehension strenght increase within her range for her age to carry more than 10 lbs, push , pull  heavy door  without symptoms   ? Baseline Grip R51, L 31 pain , Lat grip R 13, L11 pain , 3 poin pinch R 9 , L 11 lbs pain -   ? Time 6   ? Period Weeks   ? Status New   ? Target Date 03/16/21   ?  ? OT LONG TERM GOAL #3  ? Title Pain on PRWHE  outcome measure improve with more than 20 points   ?  Baseline PRWHE pain score 38/50 for hand and wrist pain   ? Time 6   ? Period Weeks   ? Status New   ? Target Date 03/16/21   ? ?  ?  ? ?  ? ? ? ? ? ? ? ? Plan - 03/21/21 0949   ? ?  Clinical Impression Statement Patient referred to OT postop left carpal tunnel endoscopy 10/26/20-patient reports she had August 22 right carpal tunnel.  Patient making great progress in pain, scar tissue,range of motion and grip and prehention strength.  Patient continues to be limited by R wrist and thumb pain and tenderness.. Pt positive tenderness over distal radius but [name] was negative   - cont to have tightness in R thumb ADD and flexors of forearms but improving. .Doing better with gripping Theraband not with thumb ADD.  Focusing on strengthening for bilateral UE - able to do pushups on wall  and using 1 lbs over head. Using GTB for shoulders but change pt's grip to not do lat pinch.  Pt to hold off on putty HEP until next visit. Biodex done 10lbs  but improving - this date little weaker in ADD horizontal on L - did 5 lbs . Was able to do 1 kg ball single hand and 2 kg bal bilateral.   Patient unable to go back to work light duty or in another job temp-can benefit from continued OT services to decrease pain increase motion and strength to return to prior level of function   ? OT Occupational Profile and History Problem Focused Assessment - Including review of records relating to presenting problem   ? Occupational performance deficits (Please refer to evaluation for details): ADL's;IADL's;Rest and Sleep;Work;Play;Leisure;Social Participation   ? Body Structure / Function / Physical Skills ADL;Strength;Pain;UE functional use;ROM;IADL;Scar mobility;Sensation;Decreased knowledge of precautions   ? Rehab Potential Good   ? Clinical Decision Making Limited treatment options, no task modification necessary   ? Comorbidities Affecting Occupational Performance: None   ? Modification or Assistance to Complete Evaluation  No modification of tasks or assist necessary to complete eval   ? OT Frequency 2x / week   ? OT Duration 4 weeks   ? OT Treatment/Interventions Self-care/ADL training;Paraffin;Ultrasound;Moist  Heat;Fluidtherapy;Contrast Bath;Therapeutic exercise;Manual Therapy;Patient/family education;Passive range of motion;Scar mobilization;Therapeutic activities;Splinting;DME and/or AE instruction   ? Co

## 2021-03-23 ENCOUNTER — Other Ambulatory Visit: Payer: Self-pay

## 2021-03-23 ENCOUNTER — Ambulatory Visit: Payer: BC Managed Care – PPO | Admitting: Occupational Therapy

## 2021-03-23 DIAGNOSIS — M25532 Pain in left wrist: Secondary | ICD-10-CM

## 2021-03-23 DIAGNOSIS — M79642 Pain in left hand: Secondary | ICD-10-CM

## 2021-03-23 DIAGNOSIS — M25531 Pain in right wrist: Secondary | ICD-10-CM

## 2021-03-23 DIAGNOSIS — M6281 Muscle weakness (generalized): Secondary | ICD-10-CM

## 2021-03-23 DIAGNOSIS — M25631 Stiffness of right wrist, not elsewhere classified: Secondary | ICD-10-CM

## 2021-03-23 DIAGNOSIS — M79641 Pain in right hand: Secondary | ICD-10-CM

## 2021-03-23 NOTE — Therapy (Signed)
[facility] ?[address] ?[place], [place], [address] ?Phone: [phone]   Fax:  [phone] ? ?Occupational Therapy Treatment ? ?Patient Details  ?Name: Kelani Robart Manus ?MRN: [id number] ?Date of Birth: 1980/02/22 ?Referring Provider (OT): Dr Roland Rack ? ? ?Encounter Date: 03/23/2021 ? ? OT End of Session - 03/23/21 0949   ? ? Visit Number 13   ? Number of Visits 18   ? Date for OT Re-Evaluation 04/11/21   ? OT Start Time [phone]   ? OT Stop Time 1028   ? OT Time Calculation (min) 39 min   ? Activity Tolerance Patient tolerated treatment well   ? Behavior During Therapy [facility] for tasks assessed/performed   ? ?  ?  ? ?  ? ? ?Past Medical History:  ?Diagnosis Date  ? 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 Asthma   ? Bronchitis   ? GERD (gastroesophageal reflux disease)   ? Headache   ? Severe sepsis ([place]) 01/26/2021  ? Tobacco use   ? ? ?Past Surgical History:  ?Procedure Laterality Date  ? CARPAL TUNNEL RELEASE Right 08/31/2020  ? Procedure: CARPAL TUNNEL RELEASE ENDOSCOPIC;  Surgeon: Corky Mull, MD;  Location: [facility];  Service: Orthopedics;  Laterality: Right;  ? CARPAL TUNNEL RELEASE Left 10/26/2020  ? Procedure: CARPAL TUNNEL RELEASE ENDOSCOPIC;  Surgeon: Corky Mull, MD;  Location: [facility] OR[facility];  Service: Orthopedics;  Laterality: Left;  ? CESAREAN SECTION    ? HIP SURGERY Bilateral   ? LAPROSCOPIC BONE SPURS  ? ? ?There were no vitals filed for this visit. ? ? Subjective Assessment - 03/23/21 0949   ? ? Subjective  <My thumb/ wrist hurts so bad - I was up the whole night - MY L hand is fine but Right one- and my job requires 50 lbs and don't know if I will be able to do - I am just stress - I waited so long to have the surgery   ? Pertinent History Pt was seen by Dr Roland Rack on 01/23/21 for follow up  12 weeks status post an endoscopic left carpal tunnel release ( 10/26/20).  R CTR done 08/31/20. Overall, the patient feels that she is doing reasonably well.  However, she continues to experience moderate pain in her  L wrist which she rates at 5/10 on today's visit, and for which she will take ibuprofen and/or Tylenol as necessary with temporary partial relief of her symptoms. She localizes the pain to the aspect of her hand. In addition, she will continue to wear her brace at night as she finds this to be helpful in enabling her to sleep. She still notes difficulty lifting objects as well as some residual paresthesias to her fingertips. She denies any reinjury to the wrist or hand, and denies any fevers or chills. She has not yet returned to work, but does not feel that she can do the work that would be required of her, such as lifting 50 pounds.  Pt refer to OT   ? 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 Currently in Pain? Yes   ? Pain Score 4    ? Pain Location Wrist   ? Pain Orientation Right   ? Pain Descriptors /  Indicators Aching;Tightness;Tender   ? Pain Type Surgical pain   ? ?  ?  ? ?  ? ? ? ? ? ?  Patient arrives this date with increased pain over the radial right wrist ?Tenderness over distal radius as well as positive [name] ?Tenderness in webspace ?Outside of strengthening patient trying to use her hand more at home active outside the house ?This date with increase first dorsal compartment pain ?Appear patient have tendinitis of the first dorsal compartment patient fitted with prefab thumb spica to wear during the day, can do contrast or ice massage ?Patient education done for modifications ?Patient worries about going back to previous work, unable to be able to do light duty. ?Patient was doing great with strengthening up to last week but with adding activities at home and outside of the house patient this week with increased pain ?Patient to do contrast with pain-free active range  of motion to thumb and wrist ? ? ? ? ? ? ? ? ? ? OT Treatments/Exercises (OP) - 03/23/21 0001   ? ?  ? Ultrasound  ? Ultrasound Location 1st dorsal compartment R   ? Ultrasound Parameters 3.66mz, 20%, 3 min   ? Ultrasound Goals Pain   ?  ? RUE Contrast Bath  ? Time 10 minutes   ? Comments prior to AROM - decrease pain   ? ?  ?  ? ?  ? ? ? ? ? ? ? ? ? OT Education - 03/23/21 0949   ? ? Education Details progress,   and change to HEP   ? Person(s) Educated Patient   ? Methods Explanation;Demonstration;Tactile cues;Verbal cues;Handout   ? Comprehension Verbal cues required;Returned demonstration;Verbalized understanding   ? ?  ?  ? ?  ? ? ? OT Short Term Goals - 03/21/21 1033   ? ?  ? OT SHORT TERM GOAL #1  ? Title Pt and husband to be independent in HEP to decrease scar tissue and pain to less than 2/10  , and increase AROM   ? Status Achieved   ? ?  ?  ? ?  ? ? ? ? OT Long Term Goals - 03/21/21 1033   ? ?  ? OT LONG TERM GOAL #1  ? Title Bilateral wrist AROM increase to WNL and pain free to push up from chair , push and pull door open   ? Baseline decrease wrist AROM in all planes and pain- still some pain R wrist 4/10 - AROM WNL   ? Time 3   ? Period Weeks   ? Status On-going   ? Target Date 04/11/21   ?  ? OT LONG TERM GOAL #2  ? Title Pt' s bilateral  grip and prehension strenght increase within her range for her age to carry more than 10 lbs, push , pull  heavy door  without symptoms   ? Baseline Grip R51, L 31 pain , Lat grip R 13, L11 pain , 3 poin pinch R 9 , L 11 lbs pain -  - INCREASING but pain   ? Time 3   ? Period Weeks   ? Status On-going   ? Target Date 04/11/21   ?  ? OT LONG TERM GOAL #3  ? Title Pain on PRWHE  outcome measure improve with more than 20 points   ? Baseline PRWHE pain score 38/50 for hand and wrist pain  NOW improving pain decrease R wrist 4/10 assess PRWHE next time   ?  Time 3   ? Period Weeks   ? Status On-going   ? Target Date 04/11/21   ? ?  ?  ? ?  ? ? ? ? ? ? ? ? Plan - 03/23/21  0950   ? ? Clinical Impression Statement Patient referred to OT postop left carpal tunnel endoscopy 10/26/20-patient reports she had August 22 right carpal tunnel.  Patient has been making great progress in pain, scar tissue,range of motion and grip and prehention strength.  Patient had some R wrist and thumb pain and tenderness..  This past week or 2 she has been using her hand more at home, pushing herself-this week patient with an crease tenderness over distal radius and positive Finkelstein test.  Patient comes in this date with increased pain 3-4/10 at radial wrist and base of thumb.  Patient was doing great with strengthening for 2 to 3 weeks but with increased use outside of home, at home and outside therapy patient with increased pain and appears tendinitis at first dorsal compartment.  Discussed with patient her job requirements, possible different job that is lighter in her hands at [employer].  Health of this date on strengthening-patient fitted with a prefab thumb spica splint to use, contrast, ice massage and pain-free active range of motion.   Patient unable to go back to work light duty or in another job temp-can benefit from continued OT services to decrease pain increase motion and strength to return to prior level of function   ? OT Occupational Profile and History Problem Focused Assessment - Including review of records relating to presenting problem   ? Occupational performance deficits (Please refer to evaluation for details): ADL's;IADL's;Rest and Sleep;Work;Play;Leisure;Social Participation   ? Body Structure / Function / Physical Skills ADL;Strength;Pain;UE functional use;ROM;IADL;Scar mobility;Sensation;Decreased knowledge of precautions   ? Rehab Potential Good   ? Clinical Decision Making Limited treatment options, no task modification necessary   ? Comorbidities Affecting Occupational Performance: None   ? Modification or Assistance to Complete Evaluation  No modification of tasks or assist  necessary to complete eval   ? OT Frequency 2x / week   ? OT Duration 4 weeks   ? OT Treatment/Interventions Self-care/ADL training;Paraffin;Ultrasound;Moist Heat;Fluidtherapy;Contrast Bath;Therapeutic exerci

## 2021-03-27 ENCOUNTER — Ambulatory Visit: Payer: Self-pay | Admitting: Nurse Practitioner

## 2021-03-30 ENCOUNTER — Ambulatory Visit: Payer: BC Managed Care – PPO | Admitting: Occupational Therapy

## 2021-03-30 DIAGNOSIS — M79642 Pain in left hand: Secondary | ICD-10-CM | POA: Diagnosis not present

## 2021-03-30 DIAGNOSIS — M25532 Pain in left wrist: Secondary | ICD-10-CM

## 2021-03-30 DIAGNOSIS — M25531 Pain in right wrist: Secondary | ICD-10-CM

## 2021-03-30 DIAGNOSIS — M6281 Muscle weakness (generalized): Secondary | ICD-10-CM

## 2021-03-30 DIAGNOSIS — M79641 Pain in right hand: Secondary | ICD-10-CM

## 2021-03-30 NOTE — Therapy (Signed)
[place] [facility] ?[address] ?[place], [place], [address] ?Phone: [phone]   Fax:  [phone] ? ?Occupational Therapy Treatment ? ?Patient Details  ?Name: Margaret Gates ?MRN: [id number] ?Date of Birth: May 19, 1980 ?Referring Provider (OT): Dr Roland Rack ? ? ?Encounter Date: 03/30/2021 ? ? OT End of Session - 03/30/21 1038   ? ? Visit Number 14   ? Number of Visits 18   ? Date for OT Re-Evaluation 04/11/21   ? OT Start Time 1038   ? OT Stop Time 1116   ? OT Time Calculation (min) 38 min   ? Activity Tolerance Patient tolerated treatment well   ? Behavior During Therapy [facility] for tasks assessed/performed   ? ?  ?  ? ?  ? ? ?Past Medical History:  ?Diagnosis Date  ? 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 Asthma   ? Bronchitis   ? GERD (gastroesophageal reflux disease)   ? Headache   ? Severe sepsis ([place]) 01/26/2021  ? Tobacco use   ? ? ?Past Surgical History:  ?Procedure Laterality Date  ? CARPAL TUNNEL RELEASE Right 08/31/2020  ? Procedure: CARPAL TUNNEL RELEASE ENDOSCOPIC;  Surgeon: Corky Mull, MD;  Location: [facility] ORS;  Service: Orthopedics;  Laterality: Right;  ? CARPAL TUNNEL RELEASE Left 10/26/2020  ? Procedure: CARPAL TUNNEL RELEASE ENDOSCOPIC;  Surgeon: Corky Mull, MD;  Location: [facility] ORS;  Service: Orthopedics;  Laterality: Left;  ? CESAREAN SECTION    ? HIP SURGERY Bilateral   ? LAPROSCOPIC BONE SPURS  ? ? ?There were no vitals filed for this visit. ? ? Subjective Assessment - 03/30/21 1038   ? ? Subjective  I left here last time crying because I feel like I am going backwards.  I stopped doing things around the house and helping my friend because my thumb was just hurting so bad.  The splint you gave me helped.  Pain is better but the right thumb and palm/wrist bothers me the most   ? Pertinent History Pt was seen by Dr Roland Rack on 01/23/21 for follow up  12 weeks status post an endoscopic left carpal tunnel release ( 10/26/20).  R CTR done 08/31/20.  Overall, the patient feels that she is doing reasonably well. However, she continues to experience moderate pain in her  L wrist which she rates at 5/10 on today's visit, and for which she will take ibuprofen and/or Tylenol as necessary with temporary partial relief of her symptoms. She localizes the pain to the aspect of her hand. In addition, she will continue to wear her brace at night as she finds this to be helpful in enabling her to sleep. She still notes difficulty lifting objects as well as some residual paresthesias to her fingertips. She denies any reinjury to the wrist or hand, and denies any fevers or chills. She has not yet returned to work, but does not feel that she can do the work that would be required of her, such as lifting 50 pounds.  Pt refer to OT   ? 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 Currently in Pain? Yes   ? Pain Score 2    ? Pain Location --   R thumb and CT  ? Pain  Orientation Right   ? Pain Descriptors / Indicators Tightness;Tender;Aching   ? Pain Type Surgical pain   ? Pain Onset More than a month ago   ? Pain Frequency Constant   ? ?  ?  ? ?  ? ? ? ? ? [facility] OT Assessment - 03/30/21 0001   ? ?  ? AROM  ? Right Wrist Extension 70 Degrees   ? Right Wrist Flexion 80 Degrees   ? Right Wrist Radial Deviation 20 Degrees   ? Right Wrist Ulnar Deviation 30 Degrees   ? Left Wrist Extension 75 Degrees   ? Left Wrist Flexion 90 Degrees   ? Left Wrist Radial Deviation 22 Degrees   ? Left Wrist Ulnar Deviation 32 Degrees   ?  ? Strength  ? Right Hand Grip (lbs) 65   ? Right Hand Lateral Pinch 17 lbs   ? Right Hand 3 Point Pinch 14 lbs   ? Left Hand Grip (lbs) 66   ? Left Hand Lateral Pinch 17 lbs   ? Left Hand 3 Point Pinch 15 lbs   ? ?  ?  ? ?  ?Assess AROM - for wrist - strength 4+/5 for wrist - except sup and RD - fear for  pain on the R ?Pain with grip and 3 point on R  ? ?Simulated various task, objects, sizes and weights under 20 pounds.  Simulated  patient work activities today-picking up 20 -25 lbs overhead and below knees- but 25 lbs had less pain with thumb spica on over head  ?Could do 30 pounds  bilateral between hips and shoulders- but not over head ?Could not do safely  35 pounds  - done weights, wide container, box with handles ? ?Difficulty with pulling or pushing up to top shelf and bottom shelf.   ?Difficulty with 35 pounds limited by R hand , thumb , palm and wrist,  ?She has been out of  work since June 2022. ?She could carry 15 pounds with R and L hand  slight pull over thenar eminence and volar wrist on R. ?  ?    ?  ?  ?  ?  ?  ? ? ? ? ? ? ?CLinical impression: ?Patient referred to OT postop left carpal tunnel endoscopy 10/26/20-patient reports she had August 22 right carpal tunnel.  Patient has been making great progress in pain, scar tissue,range of motion and grip and prehention strength until about  first week of this month- Pt the last 2-3 wks increase pain and discomfort in R thumb , palm and wrist. Past  2-3 wks had been using her hand more than just HEP -  pushing herself to see what her hands can do. Patient was doing great with strengthening until 2 to 3 weeks ago but with increased use outside of home, at home and outside therapy patient with increased pain and appears tendinitis at first dorsal compartment. Pt last session increase tenderness over distal radius and positive Finkelstein test.  Pt was place in prefab thumb spica to use this past weekend and this date pain decrease  from 4 to 2/10     Discussed with patient her job requirements, possible different job that is lighter in her hands at [employer] but per pt no option. This date simulated work activities- pt was able to do bilateral hands  20-25 lbs with and without splint lifting over head and carrying. 30 lbs with splint -but 35 not able to do  -requires wrist ,  palm and thumb that cause pain. Can carry now 15 lbs in each hand - last time seen DR was able to do only 6 -10 lbs. this date grip and prehension strength WNL for her age but pain with grip on R and 3 point. Pt to use prefab thumb spica splint while conditioning her R hand and wrist with activities  at home that cause pain more than 2/10 otherwise go without splints.  Patient  report she is unable to go back to work light duty or in another job dept-patient has appointment with orthopedics on Monday.  Will await if patient needs to continue with me 1 time a week for 4 to 6 weeks while conditioning or if she can return back to work in another department or lighter weight requirement or HEP. ? ? ? ? ? ? ? ? OT Education - 03/30/21 1435   ? ? Education Details progress,   simulations of activities for work and home   ? Person(s) Educated Patient   ? Methods Explanation;Demonstration;Tactile cues;Verbal cues;Handout   ? Comprehension Verbal cues required;Returned demonstration;Verbalized understanding   ? ?  ?  ? ?  ? ? ? OT Short Term Goals - 03/21/21 1033   ? ?  ? OT SHORT TERM GOAL #1  ? Title Pt and husband to be independent in HEP to decrease scar tissue and pain to less than 2/10  , and increase AROM   ? Status Achieved   ? ?  ?  ? ?  ? ? ? ? OT Long Term Goals - 03/21/21 1033   ? ?  ? OT LONG TERM GOAL #1  ? Title Bilateral wrist AROM increase to WNL and pain free to push up from chair , push and pull door open   ? Baseline decrease wrist AROM in all planes and pain- still some pain R wrist 4/10 - AROM WNL   ? Time 3   ? Period Weeks   ? Status On-going   ? Target Date 04/11/21   ?  ? OT LONG TERM GOAL #2  ? Title Pt' s bilateral  grip and prehension strenght increase within her range for her age to carry more than 10 lbs, push , pull  heavy door  without symptoms   ? Baseline Grip R51, L 31 pain , Lat grip R 13, L11 pain , 3 poin pinch R 9 , L 11 lbs pain -  - INCREASING but pain   ? Time 3    ? Period Weeks   ? Status On-going   ? Target Date 04/11/21   ?  ? OT LONG TERM GOAL #3  ? Title Pain on PRWHE  outcome measure improve with more than 20 points   ? Baseline PRWHE pain score 38/50 for hand

## 2021-03-31 ENCOUNTER — Ambulatory Visit: Payer: BC Managed Care – PPO | Admitting: Occupational Therapy

## 2021-04-03 DIAGNOSIS — M79644 Pain in right finger(s): Secondary | ICD-10-CM | POA: Insufficient documentation

## 2021-04-04 ENCOUNTER — Ambulatory Visit: Payer: BC Managed Care – PPO | Attending: Surgery | Admitting: Occupational Therapy

## 2021-04-04 DIAGNOSIS — M79641 Pain in right hand: Secondary | ICD-10-CM | POA: Insufficient documentation

## 2021-04-04 DIAGNOSIS — M25531 Pain in right wrist: Secondary | ICD-10-CM | POA: Insufficient documentation

## 2021-04-04 DIAGNOSIS — M25631 Stiffness of right wrist, not elsewhere classified: Secondary | ICD-10-CM | POA: Insufficient documentation

## 2021-04-04 DIAGNOSIS — M79642 Pain in left hand: Secondary | ICD-10-CM | POA: Insufficient documentation

## 2021-04-04 DIAGNOSIS — M6281 Muscle weakness (generalized): Secondary | ICD-10-CM | POA: Diagnosis present

## 2021-04-04 DIAGNOSIS — M25532 Pain in left wrist: Secondary | ICD-10-CM | POA: Diagnosis present

## 2021-04-04 DIAGNOSIS — M25632 Stiffness of left wrist, not elsewhere classified: Secondary | ICD-10-CM | POA: Diagnosis present

## 2021-04-04 NOTE — Therapy (Signed)
[place] [facility] ?[address] ?[place], [place], [address] ?Phone: [phone]   Fax:  [phone] ? ?Occupational Therapy Treatment ? ?Patient Details  ?Name: Margaret Gates ?MRN: [id number] ?Date of Birth: July 22, 1980 ?Referring Provider (OT): Dr Roland Rack ? ? ?Encounter Date: 04/04/2021 ? ? OT End of Session - 04/04/21 0948   ? ? Visit Number 15   ? Number of Visits 18   ? Date for OT Re-Evaluation 04/11/21   ? OT Start Time [id number]   ? OT Stop Time 1029   ? OT Time Calculation (min) 41 min   ? Activity Tolerance Patient tolerated treatment well   ? Behavior During Therapy [facility] for tasks assessed/performed   ? ?  ?  ? ?  ? ? ?Past Medical History:  ?Diagnosis Date  ? 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 Asthma   ? Bronchitis   ? GERD (gastroesophageal reflux disease)   ? Headache   ? Severe sepsis ([place]) 01/26/2021  ? Tobacco use   ? ? ?Past Surgical History:  ?Procedure Laterality Date  ? CARPAL TUNNEL RELEASE Right 08/31/2020  ? Procedure: CARPAL TUNNEL RELEASE ENDOSCOPIC;  Surgeon: Corky Mull, MD;  Location: [facility] ORS;  Service: Orthopedics;  Laterality: Right;  ? CARPAL TUNNEL RELEASE Left 10/26/2020  ? Procedure: CARPAL TUNNEL RELEASE ENDOSCOPIC;  Surgeon: Corky Mull, MD;  Location: [facility] ORS;  Service: Orthopedics;  Laterality: Left;  ? CESAREAN SECTION    ? HIP SURGERY Bilateral   ? LAPROSCOPIC BONE SPURS  ? ? ?There were no vitals filed for this visit. ? ? Subjective Assessment - 04/04/21 0948   ? ? Subjective  I seen Dr. Roland Rack yesterday, he also suggested like you may be need to start thinking of changing to a job as lateral my hands.  I did not do too much at home the pain is better but my right thumb and palm still worse than the left.  Wearing the splint less than 25% of the time of my right hand.   ? Pertinent History Pt was seen by Dr Roland Rack on 01/23/21 for follow up  12 weeks status post an endoscopic left carpal tunnel release ( 10/26/20).  R CTR done  08/31/20. Overall, the patient feels that she is doing reasonably well. However, she continues to experience moderate pain in her  L wrist which she rates at 5/10 on today's visit, and for which she will take ibuprofen and/or Tylenol as necessary with temporary partial relief of her symptoms. She localizes the pain to the aspect of her hand. In addition, she will continue to wear her brace at night as she finds this to be helpful in enabling her to sleep. She still notes difficulty lifting objects as well as some residual paresthesias to her fingertips. She denies any reinjury to the wrist or hand, and denies any fevers or chills. She has not yet returned to work, but does not feel that she can do the work that would be required of her, such as lifting 50 pounds.  Pt refer to OT   ? 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 Currently in Pain? Yes   ? Pain Score 2    ? Pain Location Hand   ?  Pain Orientation Right   ? Pain Descriptors / Indicators Tender;Aching   ? Pain Type Surgical pain   ? Pain Onset More than a month ago   ? Pain Frequency Intermittent   ? ?  ?  ? ?  ? ? ? ? ? ? ?Assess AROM - for wrist - strength 4+/5 for wrist - except sup and RD - fear for pain on the R ?Patient reported pain over right first dorsal compartment with thumb extension or wrist flexion extension 2/10.  Better this date than last week. ?Patient was seen by surgeon yesterday-continue with OT until next appointment with surgeon in a month. ?Decrease patient to 1 time a week for work conditioning. ?Patient tender over palmar scar and thumb CMC on the right more than left. ?Patient able to carry at side 7-20 pounds in the right and left hand.  But limited in the right grip and using palm to carry, left, push and pull. ?Patient was able to carry wide grip 5-7 pounds  overhead with left and right hand 30 seconds at a time.  Patient to simulate at home with 1/2 gallon milk or similar weight caring with palm overhead.  Could not do 10 pounds had increased symptoms discomfort and pain. ? ?Patient was able to do 15 pounds with bilateral hands in neutral position 20 reps transferring weight waist to shoulder height chills and shoulder height selves to overhead. ?Followed by 20 pounds same neutral position using palms shoulder height overhead 10 reps with fatigue.  But waist to shoulder height with more ease in 20 reps. ?Patient in supine with 20 pounds kettle bell had trouble but could do using bilateral hands 1 hand with palm taking weight other hand guiding 50% of weight in a neutral position 15 seconds x 3. ?Patient to work on bilateral hands using palms to hold up 15-20 pounds in supine with palm guiding with opposite hand 50% of the weight.  Patient can increase time, reps and sets gradually with pain less than 2/10 at home. ?  ?  ?    ? ? ? ? ?  Patient has a harder time with using palms to push, left and carry weight shoulder height and higher.  Better at side in the wrist neutral position with no pressure through palms.  Also hard time picking up anything more than 30-35 pounds with bilateral hands. ? ? ? ? ? ? ? ? ? ? ? ? OT Education - 04/04/21 0948   ? ? Education Details progress,   simulations of activities for work and home   ? Person(s) Educated Patient   ? Methods Explanation;Demonstration;Tactile cues;Verbal cues;Handout   ? Comprehension Verbal cues required;Returned demonstration;Verbalized understanding   ? ?  ?  ? ?  ? ? ? OT Short Term Goals - 03/21/21 1033   ? ?  ? OT SHORT TERM GOAL #1  ? Title Pt and husband to be independent in HEP to decrease scar tissue and pain to less than 2/10  , and increase AROM   ? Status Achieved   ? ?  ?  ? ?  ? ? ? ? OT Long Term Goals - 03/21/21 1033   ? ?  ? OT LONG TERM GOAL #1  ? Title Bilateral wrist AROM increase to WNL and  pain free to push up from chair , push and pull door open   ? Baseline decrease wrist AROM in all planes and pain- still some pain R wrist 4/10 -  AROM WNL   ? Time 3   ? Period Weeks   ? Status On-going   ? Target Date 04/11/21   ?  ? OT LONG TERM GOAL #2  ? Title Pt' s bilateral  grip and prehension strenght increase within her range for her age to carry more than 10 lbs, push , pull  heavy door  without symptoms   ? Baseline Grip R51, L 31 pain , Lat grip R 13, L11 pain , 3 poin pinch R 9 , L 11 lbs pain -  - INCREASING but pain   ? Time 3   ? Period Weeks   ? Status On-going   ? Target Date 04/11/21   ?  ? OT LONG TERM GOAL #3  ? Title Pain on PRWHE  outcome measure improve with more than 20 points   ? Baseline PRWHE pain score 38/50 for hand and wrist pain  NOW improving pain decrease R wrist 4/10 assess PRWHE next time   ? Time 3   ? Period Weeks   ? Status On-going   ? Target Date 04/11/21   ? ?  ?  ? ?  ? ? ? ? ? ? ? ? Plan - 04/04/21 0948   ? ? Clinical Impression Statement Patient referred to OT postop left carpal tunnel endoscopy 10/26/20-patient reports she had August 22 right carpal tunnel.  Patient was making great progress up to about first week of March.  And then had increased pain over her first dorsal compartment and thumb on the right hand limiting her in strengthening exercises.  During strengthening phase patient was trying to use hands more outside of home and in the house.  Patient was fitted with a thumb spica about a week ago.  Seen Dr. Roland Rack  yesterday-x-rays did not show any CMC arthritis.  Patient arrived this date with pain and tenderness about a 2/10 right thumb and palmar scar more than left.  Patient decreased to 1 time a week for 4 to 5 weeks until next follow-up with surgeon.  Focus this date on work conditioning with patient lifting carrying 7 to 20 pounds and need to be able to use palms more in pushing,lifting and carrying.  One-handed patient can carry and lift 7 pounds overhead  with comfort but 10 pounds causing pain and discomfort.  Patient was able to do bilaterally 20 pounds waist to shoulder height shoulder height to overhead.  Done in the clinic and added to home program t

## 2021-04-10 ENCOUNTER — Ambulatory Visit: Payer: BC Managed Care – PPO | Admitting: Occupational Therapy

## 2021-04-10 DIAGNOSIS — M79642 Pain in left hand: Secondary | ICD-10-CM

## 2021-04-10 DIAGNOSIS — M6281 Muscle weakness (generalized): Secondary | ICD-10-CM

## 2021-04-10 DIAGNOSIS — M79641 Pain in right hand: Secondary | ICD-10-CM

## 2021-04-10 DIAGNOSIS — M25531 Pain in right wrist: Secondary | ICD-10-CM

## 2021-04-10 DIAGNOSIS — M25532 Pain in left wrist: Secondary | ICD-10-CM

## 2021-04-10 NOTE — Therapy (Signed)
[facility] ?[address] ?[place], [place], [address] ?Phone: [phone]   Fax:  [phone] ? ?Occupational Therapy Treatment ? ?Patient Details  ?Name: Margaret Gates ?MRN: [id number] ?Date of Birth: 06/18/1980 ?Referring Provider (OT): Dr Roland Rack ? ? ?Encounter Date: 04/10/2021 ? ? OT End of Session - 04/10/21 0945   ? ? Visit Number 16   ? Number of Visits 18   ? Date for OT Re-Evaluation 04/11/21   ? OT Start Time [phone]   ? OT Stop Time 1040   ? OT Time Calculation (min) 54 min   ? Activity Tolerance Patient tolerated treatment well   ? Behavior During Therapy [facility] for tasks assessed/performed   ? ?  ?  ? ?  ? ? ?Past Medical History:  ?Diagnosis Date  ? 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 Asthma   ? Bronchitis   ? GERD (gastroesophageal reflux disease)   ? Headache   ? Severe sepsis ([place]) 01/26/2021  ? Tobacco use   ? ? ?Past Surgical History:  ?Procedure Laterality Date  ? CARPAL TUNNEL RELEASE Right 08/31/2020  ? Procedure: CARPAL TUNNEL RELEASE ENDOSCOPIC;  Surgeon: Corky Mull, MD;  Location: [facility] ORS;  Service: Orthopedics;  Laterality: Right;  ? CARPAL TUNNEL RELEASE Left 10/26/2020  ? Procedure: CARPAL TUNNEL RELEASE ENDOSCOPIC;  Surgeon: Corky Mull, MD;  Location: [facility] OR[facility];  Service: Orthopedics;  Laterality: Left;  ? CESAREAN SECTION    ? HIP SURGERY Bilateral   ? LAPROSCOPIC BONE SPURS  ? ? ?There were no vitals filed for this visit. ? ? Subjective Assessment - 04/10/21 0945   ? ? Subjective  I did the weight - carrying and lifting - but my thumbs just don't like me - the padding of my thumbs and then they want to lock- I did 10 lbs carrying ,pulling and lifting - even driving and holding on things my thumbs just hurts   ? Pertinent History Pt was seen by Dr Roland Rack on 01/23/21 for follow up  12 weeks status post an endoscopic left carpal tunnel release ( 10/26/20).  R CTR done 08/31/20. Overall, the patient feels that she is doing  reasonably well. However, she continues to experience moderate pain in her  L wrist which she rates at 5/10 on today's visit, and for which she will take ibuprofen and/or Tylenol as necessary with temporary partial relief of her symptoms. She localizes the pain to the aspect of her hand. In addition, she will continue to wear her brace at night as she finds this to be helpful in enabling her to sleep. She still notes difficulty lifting objects as well as some residual paresthesias to her fingertips. She denies any reinjury to the wrist or hand, and denies any fevers or chills. She has not yet returned to work, but does not feel that she can do the work that would be required of her, such as lifting 50 pounds.  Pt refer to OT   ? 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 Currently in Pain? Yes   ? Pain Score 4    ? Pain Location --   Thumb thenar  ? Pain Orientation Right;Left   ? Pain Descriptors / Indicators Aching;Sore   ?  Pain Type Surgical pain   ? Pain Onset More than a month ago   ? ?  ?  ? ?  ? ? ? ? ?  Patient did state coming in with increased tenderness over bilateral thumb thenar eminence-tenderness over [place] 4-5/10.  Patient did had x-ray that did not show any arthritis of bilateral CMC last week with Dr. Marland KitchenPatient did not had increased pain over first dorsal compartment, with thumb palmar radial abduction resistance.  Did not had increased pain with resistance to opposition to fifth.  Did had increased pain with resistance with thumb flexion. ?Done this date with patient more Biodex exercises was able to do 15-20 pounds bilaterally change home exercise program and upgrade to blue Thera-Band to do shoulder and elbow 12 reps.  But do not over grip with thumb flexion and abduction but run Thera-Band through second and third or fourth and  fifth digits.  Had less pain with changes to home program patient to hold off for 48 hours with 7-10 pounds carrying and lifting at home ?Done this day iontophoresis with dexamethasone on bilateral thenar eminence over CMC, 2.0 current tolerating well for 19 minutes medium patches ?Did get a verbal order from Dr.Poggi prior to last visit with Dr. Roland Rack. Patient to follow-up next week can start in 48 hours home program from last week. ? ? ? ? ? ? ? ? ? ? ? ? ? ? ? ? ? ? OT Education - 04/10/21 0945   ? ? Education Details progress,   simulations of activities for work and home   ? Person(s) Educated Patient   ? Methods Explanation;Demonstration;Tactile cues;Verbal cues;Handout   ? Comprehension Verbal cues required;Returned demonstration;Verbalized understanding   ? ?  ?  ? ?  ? ? ? OT Short Term Goals - 03/21/21 1033   ? ?  ? OT SHORT TERM GOAL #1  ? Title Pt and husband to be independent in HEP to decrease scar tissue and pain to less than 2/10  , and increase AROM   ? Status Achieved   ? ?  ?  ? ?  ? ? ? ? OT Long Term Goals - 03/21/21 1033   ? ?  ? OT LONG TERM GOAL #1  ? Title Bilateral wrist AROM increase to WNL and pain free to push up from chair , push and pull door open   ? Baseline decrease wrist AROM in all planes and pain- still some pain R wrist 4/10 - AROM WNL   ? Time 3   ? Period Weeks   ? Status On-going   ? Target Date 04/11/21   ?  ? OT LONG TERM GOAL #2  ? Title Pt' s bilateral  grip and prehension strenght increase within her range for her age to carry more than 10 lbs, push , pull  heavy door  without symptoms   ? Baseline Grip R51, L 31 pain , Lat grip R 13, L11 pain , 3 poin pinch R 9 , L 11 lbs pain -  - INCREASING but pain   ? Time 3   ? Period Weeks   ? Status On-going   ? Target Date 04/11/21   ?  ? OT LONG TERM GOAL #3  ? Title Pain on PRWHE  outcome measure improve with more than 20 points   ? Baseline PRWHE pain score 38/50 for hand and wrist pain  NOW improving pain decrease R wrist  4/10 assess PRWHE next time   ?  Time 3   ? Period Weeks   ? Status On-going   ? Target Date 04/11/21   ? ?  ?  ? ?  ? ? ? ? ? ? ? ? Plan - 04/10/21 0946   ? ? Clinical Impression Statement Patient referred to OT postop left carpal tunnel endoscopy 10/26/20-patient reports she had August 22 right carpal tunnel.  Patient was making great progress up to about first week of March.  And then had increased pain over her first dorsal compartment and thumb on the right hand limiting her in strengthening exercises.  During strengthening phase patient was trying to use hands more outside of home and in the house.  Patient was fitted with a thumb spica about a week ago.  Seen Dr. Roland Rack  last weeky-x-rays did not show any CMC arthritis.  Patient arrived this date with pain and tenderness about a 2/10 right thumb thenar R more than L.  Patient decreased to 1 time a week for 4 to 5 weeks until next follow-up with surgeon.  Focus last week on work conditioning with patient lifting carrying 7 to 20 pounds and need to be able to use palms more in pushing,lifting and carrying.  One-handed patient can carry and lift 7 pounds overhead with comfort but 10 pounds causing pain and discomfort. Pt report she done at home 10lbs but increase discomfort of 4-5/10 coming in.  Done this date Biodex 15-20 lbs and then Upgrade HEP to BTB for shoulder and elbow - hold off on carrying weight for 48 hrs  and done Ionto over Thenar eminence/ CMC this date end of session. Can cont with home program to do bilateral lifting and holding 20 pounds - from shelf to shelft over head - but also  in supine with palm and other hand guiding 50% of weight.  Patient to do 20 seconds - 30 seconds x 3 at the time. And do 7-10 lbs on palm carrying.   Patient to try and keep pain under 2/10.  Pt to use prefab thumb spica splint as needed while conditioning her R hand and wrist with activities that cause pain more than 2/10. Patient unable to go back to work light duty  or in another job dept-Pt to cont with OT 1 x wk for 4-6 wks until next appt with Surgeon while conditioning to return back to work  and [employer]   ? OT Occupational Profile and History Problem Focused Assessment -

## 2021-04-17 ENCOUNTER — Ambulatory Visit: Payer: BC Managed Care – PPO | Admitting: Occupational Therapy

## 2021-04-21 ENCOUNTER — Encounter: Payer: Self-pay | Admitting: Occupational Therapy

## 2021-04-21 ENCOUNTER — Ambulatory Visit: Payer: BC Managed Care – PPO | Admitting: Occupational Therapy

## 2021-04-21 DIAGNOSIS — M25532 Pain in left wrist: Secondary | ICD-10-CM

## 2021-04-21 DIAGNOSIS — M25631 Stiffness of right wrist, not elsewhere classified: Secondary | ICD-10-CM

## 2021-04-21 DIAGNOSIS — M79642 Pain in left hand: Secondary | ICD-10-CM

## 2021-04-21 DIAGNOSIS — M25531 Pain in right wrist: Secondary | ICD-10-CM

## 2021-04-21 DIAGNOSIS — M6281 Muscle weakness (generalized): Secondary | ICD-10-CM

## 2021-04-21 DIAGNOSIS — M25632 Stiffness of left wrist, not elsewhere classified: Secondary | ICD-10-CM

## 2021-04-21 DIAGNOSIS — M79641 Pain in right hand: Secondary | ICD-10-CM

## 2021-04-21 NOTE — Therapy (Signed)
[place] [facility] ?[address] ?[place], [place], [address] ?Phone: [phone]   Fax:  [phone] ? ?Occupational Therapy Treatment ? ?Patient Details  ?Name: Margaret Gates ?MRN: [id number] ?Date of Birth: 06-Mar-1980 ?Referring Provider (OT): Dr Roland Rack ? ? ?Encounter Date: 04/21/2021 ? ? OT End of Session - 04/21/21 1921   ? ? Visit Number 17   ? Number of Visits 21   ? Date for OT Re-Evaluation 05/12/21   ? OT Start Time 1000   ? OT Stop Time 2355   ? OT Time Calculation (min) 49 min   ? Activity Tolerance Patient tolerated treatment well   ? Behavior During Therapy [facility] for tasks assessed/performed   ? ?  ?  ? ?  ? ? ?Past Medical History:  ?Diagnosis Date  ? 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 Asthma   ? Bronchitis   ? GERD (gastroesophageal reflux disease)   ? Headache   ? Severe sepsis ([place]) 01/26/2021  ? Tobacco use   ? ? ?Past Surgical History:  ?Procedure Laterality Date  ? CARPAL TUNNEL RELEASE Right 08/31/2020  ? Procedure: CARPAL TUNNEL RELEASE ENDOSCOPIC;  Surgeon: Corky Mull, MD;  Location: [facility];  Service: Orthopedics;  Laterality: Right;  ? CARPAL TUNNEL RELEASE Left 10/26/2020  ? Procedure: CARPAL TUNNEL RELEASE ENDOSCOPIC;  Surgeon: Corky Mull, MD;  Location: [facility];  Service: Orthopedics;  Laterality: Left;  ? CESAREAN SECTION    ? HIP SURGERY Bilateral   ? LAPROSCOPIC BONE SPURS  ? ? ?There were no vitals filed for this visit. ? ? Subjective Assessment - 04/21/21 1911   ? ? Subjective  Pt reports her next appt with Poggi on May 05, 2021.  She reports she is working towards increasing her ability to lift more weight for potential return to her job at [employer].   ? Pertinent History Pt was seen by Dr Roland Rack on 01/23/21 for follow up  12 weeks status post an endoscopic left carpal tunnel release ( 10/26/20).  R CTR done 08/31/20. Overall, the patient feels that she is doing reasonably well. However, she continues to experience moderate  pain in her  L wrist which she rates at 5/10 on today's visit, and for which she will take ibuprofen and/or Tylenol as necessary with temporary partial relief of her symptoms. She localizes the pain to the aspect of her hand. In addition, she will continue to wear her brace at night as she finds this to be helpful in enabling her to sleep. She still notes difficulty lifting objects as well as some residual paresthesias to her fingertips. She denies any reinjury to the wrist or hand, and denies any fevers or chills. She has not yet returned to work, but does not feel that she can do the work that would be required of her, such as lifting 50 pounds.  Pt refer to OT   ? 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 Currently in Pain? Yes   ? Pain Score 3    ? Pain Location Other (Comment)   thumb thenar area  ? Pain Orientation Right;Left   ? Pain Descriptors / Indicators Aching;Sore   ? Pain Type Surgical pain   ? Pain Onset  More than a month ago   ? Pain Frequency Intermittent   ? ?  ?  ? ?  ? ? ? ? ? [facility] OT Assessment - 04/21/21 1016   ? ?  ? AROM  ? Right Wrist Extension 68 Degrees   ? Right Wrist Flexion 78 Degrees   ? Right Wrist Radial Deviation 20 Degrees   ? Right Wrist Ulnar Deviation 30 Degrees   ? Left Wrist Extension 72 Degrees   ? Left Wrist Flexion 90 Degrees   ? Left Wrist Radial Deviation 22 Degrees   ? Left Wrist Ulnar Deviation 30 Degrees   ?  ? Strength  ? Right Hand Grip (lbs) 78   ? Right Hand Lateral Pinch 19 lbs   ? Right Hand 3 Point Pinch 16 lbs   ? Left Hand Grip (lbs) 70   ? Left Hand Lateral Pinch 19 lbs   ? Left Hand 3 Point Pinch 19 lbs   ? ?  ?  ? ?  ? ? ?Therapeutic Exercises:   ?Reassessment of hand skills with measurements as noted in above flowsheet, please refer for details.  Goals reviewed and updated to reflect  progress.  ?PRWHE administered: 20/50 ? ?Biodex exercises for 2 sets of 12 reps with 15-20 pounds, push and pull, open hand to push, no gripping.  Elbow flexion/extension in standing with 8# weight, diagonal press with 8# for 1 set of 12 each side.  Palm carry with 8# for 4 rounds each side.  Med. Weighted ball in standing with toss to rebounder for 1 min 30 secs.  Occasional cues for form with exercises.  ? ?Iontophoresis performed with dexamethasone on bilateral thenar eminence over CMC, 1.5 current tolerating well for 25 minutes medium patches to decrease pain and inflammation ? ?Response to tx: ?Patient referred to OT postop left carpal tunnel endoscopy 10/26/20-patient reports she had August 22 right carpal tunnel.  Patient was making great progress up to about first week of March.  And then had increased pain over her first dorsal compartment and thumb on the right hand limiting her in strengthening exercises.  During strengthening phase patient was trying to use hands more outside of home and in the house.  Patient was fitted with a thumb spica about a week ago.  Seen Dr. Roland Rack  last weeky-x-rays did not show any CMC arthritis.  Patient arrived this date with pain and tenderness about a 2/10 right thumb thenar R more than L.  Patient decreased to 1 time a week for 3 weeks until next follow-up with surgeon.  Pt continuing to engage in lifting and strengthening tasks with attempts to keep pain 2/10 or less.  She has a 10# weight at home which is still difficult to use at times overhead and also has to focus on wrist position.  Able to carry 7-10# one handed palm carrying.  Pt currently not working at her primary job at [employer] and will be following up with MD on 05-05-2021.  Reassessment of hand function this date with measurements.  She continues to benefit from skilled OT services to work towards goals in plan of care to decrease pain, increase strength and functional use of UEs at home, work and in the  community,  ? ? ? ? ? ? ? ? ? ? ? ? ? ? OT Education - 04/21/21 1921   ? ? Education Details progress,   simulations of activities for work and home   ? Person(s) Educated Patient   ?  Methods Explanation;Demonstration;Tactile cues;Verbal cues;Handout   ? Comprehension Verbal cues required;Returned demonstration;Verbalized understanding   ? ?  ?  ? ?  ? ? ? OT Short Term Goals - 03/21/21 1033   ? ?  ? OT SHORT TERM GOAL #1  ? Title Pt and husband to be independent in HEP to decrease scar tissue and pain to less than 2/10  , and increase AROM   ? Status Achieved   ? ?  ?  ? ?  ? ? ? ? OT Long Term Goals - 04/21/21 1047   ? ?  ? OT LONG TERM GOAL #1  ? Title Bilateral wrist AROM increase to WNL and pain free to push up from chair , push and pull door open   ? Baseline decrease wrist AROM in all planes and pain- still some pain R wrist 4/10 - AROM WNL, 04/21/2021: 2/10 pain with activity   ? Time 3   ? Period Weeks   ? Status On-going   ? Target Date 05/12/21   ?  ? OT LONG TERM GOAL #2  ? Title Pt' s bilateral  grip and prehension strenght increase within her range for her age to carry more than 10 lbs, push , pull  heavy door  without symptoms   ? Baseline Grip R51, L 31 pain , Lat grip R 13, L11 pain , 3 poin pinch R 9 , L 11 lbs pain -  - INCREASING but pain.  04/21/2021: continues to improve but still has mild pain   ? Time 3   ? Period Weeks   ? Status On-going   ? Target Date 05/12/21   ?  ? OT LONG TERM GOAL #3  ? Title Pain on PRWHE  outcome measure improve with more than 20 points   ? Baseline PRWHE pain score 38/50 for hand and wrist pain  NOW improving pain decrease R wrist 4/10 assess PRWHE next time. 04/21/2021 score 20/50   ? Time 3   ? Period Weeks   ? Status On-going   ? Target Date 05/12/21   ? ?  ?  ? ?  ? ? ? ? ? ? ? ? Plan - 04/21/21 1923   ? ? Clinical Impression Statement Patient referred to OT postop left carpal tunnel endoscopy 10/26/20-patient reports she had August 22 right carpal tunnel.   Patient was making great progress up to about first week of March.  And then had increased pain over her first dorsal compartment and thumb on the right hand limiting her in strengthening exercises.  During strengthening

## 2021-04-25 ENCOUNTER — Ambulatory Visit: Payer: BC Managed Care – PPO | Admitting: Occupational Therapy

## 2021-04-25 DIAGNOSIS — M79642 Pain in left hand: Secondary | ICD-10-CM

## 2021-04-25 DIAGNOSIS — M25532 Pain in left wrist: Secondary | ICD-10-CM

## 2021-04-25 DIAGNOSIS — M25632 Stiffness of left wrist, not elsewhere classified: Secondary | ICD-10-CM

## 2021-04-25 DIAGNOSIS — M6281 Muscle weakness (generalized): Secondary | ICD-10-CM

## 2021-04-25 DIAGNOSIS — M79641 Pain in right hand: Secondary | ICD-10-CM

## 2021-04-25 DIAGNOSIS — M25631 Stiffness of right wrist, not elsewhere classified: Secondary | ICD-10-CM

## 2021-04-25 NOTE — Therapy (Signed)
[facility] ?[address] ?[place], [place], [address] ?Phone: [phone]   Fax:  [phone] ? ?Occupational Therapy Treatment ? ?Patient Details  ?Name: Margaret Gates ?MRN: [id number] ?Date of Birth: 03/01/1980 ?Referring Provider (OT): Dr Roland Rack ? ? ?Encounter Date: 04/25/2021 ? ? OT End of Session - 04/25/21 1038   ? ? Visit Number 18   ? Number of Visits 21   ? Date for OT Re-Evaluation 05/12/21   ? OT Start Time 1038   ? OT Stop Time 1118   ? OT Time Calculation (min) 40 min   ? Activity Tolerance Patient tolerated treatment well   ? Behavior During Therapy [facility] for tasks assessed/performed   ? ?  ?  ? ?  ? ? ?Past Medical History:  ?Diagnosis Date  ? 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 Asthma   ? Bronchitis   ? GERD (gastroesophageal reflux disease)   ? Headache   ? Severe sepsis ([place]) 01/26/2021  ? Tobacco use   ? ? ?Past Surgical History:  ?Procedure Laterality Date  ? CARPAL TUNNEL RELEASE Right 08/31/2020  ? Procedure: CARPAL TUNNEL RELEASE ENDOSCOPIC;  Surgeon: Corky Mull, MD;  Location: [facility] ORS;  Service: Orthopedics;  Laterality: Right;  ? CARPAL TUNNEL RELEASE Left 10/26/2020  ? Procedure: CARPAL TUNNEL RELEASE ENDOSCOPIC;  Surgeon: Corky Mull, MD;  Location: [facility] ORS;  Service: Orthopedics;  Laterality: Left;  ? CESAREAN SECTION    ? HIP SURGERY Bilateral   ? LAPROSCOPIC BONE SPURS  ? ? ?There were no vitals filed for this visit. ? ? Subjective Assessment - 04/25/21 1038   ? ? Subjective  I am doing better, my hands are not as tender as it used to be.  Do still have some pain over the thumbs.  My 10 pounds I can definitely tell it is harder on my hands than the 8 pounds that I was doing with you here.   ? Pertinent History Pt was seen by Dr Roland Rack on 01/23/21 for follow up  12 weeks status post an endoscopic left carpal tunnel release ( 10/26/20).  R CTR done 08/31/20. Overall, the patient feels that she is doing reasonably well.  However, she continues to experience moderate pain in her  L wrist which she rates at 5/10 on today's visit, and for which she will take ibuprofen and/or Tylenol as necessary with temporary partial relief of her symptoms. She localizes the pain to the aspect of her hand. In addition, she will continue to wear her brace at night as she finds this to be helpful in enabling her to sleep. She still notes difficulty lifting objects as well as some residual paresthesias to her fingertips. She denies any reinjury to the wrist or hand, and denies any fevers or chills. She has not yet returned to work, but does not feel that she can do the work that would be required of her, such as lifting 50 pounds.  Pt refer to OT   ? 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 Currently in Pain? Yes   ? Pain Score 1    ? Pain Location Hand   ? Pain Orientation Right;Left   ? Pain Descriptors / Indicators Sore   ?  Pain Type Surgical pain   ? Pain Onset More than a month ago   ? Aggravating Factors  Tender over thenar eminence   ? ?  ?  ? ?  ? ? ? ? ? [place] OT Assessment - 04/25/21 0001   ? ?  ? Strength  ? Right Hand Grip (lbs) 78   ? Right Hand Lateral Pinch 20 lbs   ? Right Hand 3 Point Pinch 19 lbs   ? Left Hand Grip (lbs) 75   ? Left Hand Lateral Pinch 19 lbs   ? Left Hand 3 Point Pinch 19 lbs   ? ?  ?  ? ?  ? ? ? ?  This date patient arrived with less pain in bilateral thenar eminence with palpation and use. ?Patient negative for first dorsal compartment tenderness over distal radius as well as [name] test. ?Bilateral wrist active range of motion within normal limits and no pain with resistance at end range strength 5/5. ?Patient do report carrying and 10 pounds weight on palm overhead at home causing some discomfort over palm thenar eminence as well as dorsal  wrist. ?Reinforced with patient that comfortable weight for her at the moment is 8 pounds carrying on palm pushing overhead one-handed. ?Patient this date was able to carry at side of body 10-20 pounds kettle bells with less discomfort. ?She could lift and carry 30 pounds bilaterally from knees to waist height.  And bilaterally from waist height to top shelf overhead able to do 10 reps but hard. ?Patient was able and add to home program to do 10 pounds for elbow curls, triceps punches and overhead punches to strengthen bilateral upper extremities.  Patient to stop carrying 10 pounds on palm overhead. ?Patient was able to do on Biodex pushing 25 pounds 12 reps with little discomfort.  But 35 pounds patient able to do only 5 reps pushing on Biodex bilaterally. ?Did attempt for patient to pick up 40 pounds with bilateral hands and carrying at waist.  Very hard. ?Patient to work on 10 pound weight upper extremity strengthening and then attempts and carry 30-40 pounds with bilateral hands from knees to waist height getting used to carrying on bilateral palms and forearm. ? ? ? ? ? ? ? ? ? ? ? ? ? ? ? OT Education - 04/25/21 1038   ? ? Education Details progress,   simulations of activities for work and home   ? Person(s) Educated Patient   ? Methods Explanation;Demonstration;Tactile cues;Verbal cues;Handout   ? Comprehension Verbal cues required;Returned demonstration;Verbalized understanding   ? ?  ?  ? ?  ? ? ? OT Short Term Goals - 03/21/21 1033   ? ?  ? OT SHORT TERM GOAL #1  ? Title Pt and husband to be independent in HEP to decrease scar tissue and pain to less than 2/10  , and increase AROM   ? Status Achieved   ? ?  ?  ? ?  ? ? ? ? OT Long Term Goals - 04/21/21 1047   ? ?  ? OT LONG TERM GOAL #1  ? Title Bilateral wrist AROM increase to WNL and pain free to push up from chair , push and pull door open   ? Baseline decrease wrist AROM in all planes and pain- still some pain R wrist 4/10 - AROM WNL, 04/21/2021: 2/10  pain with activity   ? Time 3   ? Period Weeks   ? Status On-going   ?  Target Date 05/12/21   ?  ? OT LONG TERM GOAL #2  ? Title Pt' s bilateral  grip and prehension strenght increase within her range for her age to carry more than 10 lbs, push , pull  heavy door  without symptoms   ? Baseline Grip R51, L 31 pain , Lat grip R 13, L11 pain , 3 poin pinch R 9 , L 11 lbs pain -  - INCREASING but pain.  04/21/2021: continues to improve but still has mild pain   ? Time 3   ? Period Weeks   ? Status On-going   ? Target Date 05/12/21   ?  ? OT LONG TERM GOAL #3  ? Title Pain on PRWHE  outcome measure improve with more than 20 points   ? Baseline PRWHE pain score 38/50 for hand and wrist pain  NOW improving pain decrease R wrist 4/10 assess PRWHE next time. 04/21/2021 score 20/50   ? Time 3   ? Period Weeks   ? Status On-going   ? Target Date 05/12/21   ? ?  ?  ? ?  ? ? ? ? ? ? ? ? Plan - 04/25/21 1038   ? ? Clinical Impression Statement Patient referred to OT postop left carpal tunnel endoscopy 10/26/20-patient reports she had August 22 right carpal tunnel.  Compared to 3 to 4 weeks ago patient making great progress in pain over first dorsal compartment as well as tenderness over bilateral thenar eminence and thumbs.  Bilateral wrist active range of motion and strength is within normal limits and pain-free.  Grip and prehension strength within normal range for her age.  Did review with patient three-point and two-point precision pinch using putty.  Patient report fine motor control opening small packages and small things still a little clumsy.  This date patient was able to carry 10 pounds at side of body one-handed and 20/30 pounds with bilateral hands using palm mostly.  Patient could do 30 pounds lifting from waist to overhead bilaterally 10 reps but very hard.  This date did attempt for patient to carry close to body at waist level using bilateral hands 40 pounds.  Very hard.  Patient to continue an exercise elbow and  shoulder with 10 pounds holding around cylinder.  And practice at home lifting and carry in larger back of 30 to 40 pounds bilaterally from knees to waist level using good body mechanics.  Patient's pain increas

## 2021-05-04 ENCOUNTER — Ambulatory Visit: Payer: BC Managed Care – PPO | Attending: Surgery | Admitting: Occupational Therapy

## 2021-05-04 DIAGNOSIS — M79641 Pain in right hand: Secondary | ICD-10-CM | POA: Diagnosis present

## 2021-05-04 DIAGNOSIS — M79642 Pain in left hand: Secondary | ICD-10-CM | POA: Diagnosis present

## 2021-05-04 DIAGNOSIS — M6281 Muscle weakness (generalized): Secondary | ICD-10-CM | POA: Diagnosis present

## 2021-05-04 DIAGNOSIS — M25531 Pain in right wrist: Secondary | ICD-10-CM | POA: Diagnosis present

## 2021-05-04 NOTE — Therapy (Signed)
[place] [facility] ?[address] ?[place], [place], [address] ?Phone: [phone]   Fax:  [phone] ? ?Occupational Therapy Treatment ? ?Patient Details  ?Name: Margaret Gates ?MRN: [id number] ?Date of Birth: 01/27/1980 ?Referring Provider (OT): Dr Roland Rack ? ? ?Encounter Date: 05/04/2021 ? ? OT End of Session - 05/04/21 1520   ? ? Visit Number 19   ? Number of Visits 21   ? Date for OT Re-Evaluation 05/12/21   ? OT Start Time 1120   ? OT Stop Time 1200   ? OT Time Calculation (min) 40 min   ? Activity Tolerance Patient tolerated treatment well   ? Behavior During Therapy [facility] for tasks assessed/performed   ? ?  ?  ? ?  ? ? ?Past Medical History:  ?Diagnosis Date  ? 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 Asthma   ? Bronchitis   ? GERD (gastroesophageal reflux disease)   ? Headache   ? Severe sepsis ([place]) 01/26/2021  ? Tobacco use   ? ? ?Past Surgical History:  ?Procedure Laterality Date  ? CARPAL TUNNEL RELEASE Right 08/31/2020  ? Procedure: CARPAL TUNNEL RELEASE ENDOSCOPIC;  Surgeon: Corky Mull, MD;  Location: [facility] ORS;  Service: Orthopedics;  Laterality: Right;  ? CARPAL TUNNEL RELEASE Left 10/26/2020  ? Procedure: CARPAL TUNNEL RELEASE ENDOSCOPIC;  Surgeon: Corky Mull, MD;  Location: [facility] ORS;  Service: Orthopedics;  Laterality: Left;  ? CESAREAN SECTION    ? HIP SURGERY Bilateral   ? LAPROSCOPIC BONE SPURS  ? ? ?There were no vitals filed for this visit. ? ? Subjective Assessment - 05/04/21 1124   ? ? Subjective  I am so frustrated I did great until about 2 days ago, then I started having those spasms in my thumb again and with the pain at my wrist.  I had my husband help massage my thumbs and I did my paraffin bath.   ? Pertinent History Pt was seen by Dr Roland Rack on 01/23/21 for follow up  12 weeks status post an endoscopic left carpal tunnel release ( 10/26/20).  R CTR done 08/31/20. Overall, the patient feels that she is doing reasonably well. However, she  continues to experience moderate pain in her  L wrist which she rates at 5/10 on today's visit, and for which she will take ibuprofen and/or Tylenol as necessary with temporary partial relief of her symptoms. She localizes the pain to the aspect of her hand. In addition, she will continue to wear her brace at night as she finds this to be helpful in enabling her to sleep. She still notes difficulty lifting objects as well as some residual paresthesias to her fingertips. She denies any reinjury to the wrist or hand, and denies any fevers or chills. She has not yet returned to work, but does not feel that she can do the work that would be required of her, such as lifting 50 pounds.  Pt refer to OT   ? 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 Currently in Pain? Yes   ? Pain Score 3    ? Pain Location --   webspaces bilateral  ? Pain Orientation Right;Left   ? Pain Descriptors / Indicators Sore   ?  Pain Type Acute pain;Surgical pain   ? ?  ?  ? ?  ? ? ? ? ? [facility] OT Assessment - 05/04/21 0001   ? ?  ? AROM  ? Right Wrist Extension 68 Degrees   ? Right Wrist Flexion 78 Degrees   ? Right Wrist Radial Deviation 20 Degrees   ? Right Wrist Ulnar Deviation 30 Degrees   ? Left Wrist Extension 72 Degrees   ? Left Wrist Flexion 90 Degrees   ? Left Wrist Radial Deviation 22 Degrees   ? Left Wrist Ulnar Deviation 30 Degrees   ?  ? Strength  ? Right Hand Grip (lbs) 68   ? Right Hand Lateral Pinch 19 lbs   ? Right Hand 3 Point Pinch 15 lbs   ? Left Hand Grip (lbs) 65   ? Left Hand Lateral Pinch 19 lbs   ? Left Hand 3 Point Pinch 17 lbs   ? ?  ?  ? ?  ? ? ?  Patient arrives this date with reports of increased pain in bilateral thumb web spaces since 2 days ago.  Tenderness in bilateral webspaces with the right worse than the left 7/10.  Tenderness over distal radius  on the right is 3/10.  And Margaret Gates 5/10 on the right. ?Last week patient was negative for first dorsal compartment tenderness over distal radius as well as Margaret Gates test. ?Bilateral wrist active range of motion continues to be within normal limits and no pain with resistance at end range strength 5/5. ?Patient able to carry at sides 10 and 15 pounds in each hand with no difficulty but some discomfort at webspaces of thumbs.   ? ?Patient this date was able to carry at side of body 10-20 pounds kettle bells. ?She could lift and carry 30 pounds bilaterally from knees to waist height.  And bilaterally from waist height to top shelf overhead able to do 10 reps but hard. ?Patient since last time doing home program to do 10 pounds for elbow curls, triceps punches and overhead punches to strengthen bilateral upper extremities.   ?Did attempt for patient to pick up 40 pounds with bilateral hands and carrying at waist.   Cont to be very hard. ?Patient showed improvement since seen orthopedics last time and carrying of weight in 1 hand as well as overhead but still limited by pain over thenar eminence when pushing or carrying weight through palm. ?And if she has increased weight or awkward size and has to use lateral grip or pinch to adjusted to be able to carry on forearm and palm patient show spasms and pain in thumb web spaces resulting and some tenosynovitis at first dorsal compartment right worse than left. ?Patient to see orthopedist tomorrow and touch base with me about plan after that. ? ? ? ? ? ? ? OT Treatments/Exercises (OP) - 05/04/21 0001   ? ?  ? RUE Paraffin  ? Number Minutes Paraffin 8 Minutes   ? RUE Paraffin Location Hand   ? Comments Decreased pain prior to soft tissue massage   ?  ? LUE Paraffin  ? Number Minutes Paraffin 8 Minutes   ? LUE Paraffin Location Hand;Wrist   ? Comments Decreased pain prior to soft tissue massage   ? ?  ?  ? ?  ? ? ?  Done soft tissue massage to webspace as well as  metacarpal spreads after paraffin at end of session today. ? ? ? ? ? ? OT Education - 05/04/21  1520   ? ? Education Details progress,   simulations of activities for work and home   ? Person(s) Educated Patient   ? Methods Explanation;Demonstration;Tactile cues;Verbal cues;Handout   ? Comprehension Verbal cues required;Returned demonstration;Verbalized understanding   ? ?  ?  ? ?  ? ? ? OT Short Term Goals - 03/21/21 1033   ? ?  ? OT SHORT TERM GOAL #1  ? Title Pt and husband to be independent in HEP to decrease scar tissue and pain to less than 2/10  , and increase AROM   ? Status Achieved   ? ?  ?  ? ?  ? ? ? ? OT Long Term Goals - 04/21/21 1047   ? ?  ? OT LONG TERM GOAL #1  ? Title Bilateral wrist AROM increase to WNL and pain free to push up from chair , push and pull door open   ? Baseline decrease wrist AROM in all planes and pain- still some pain R wrist 4/10 - AROM WNL, 04/21/2021: 2/10 pain with activity   ? Time 3   ? Period Weeks   ? Status On-going   ? Target Date 05/12/21   ?  ? OT LONG TERM GOAL #2  ? Title Pt' s bilateral  grip and prehension strenght increase within her range for her age to carry more than 10 lbs, push , pull  heavy door  without symptoms   ? Baseline Grip R51, L 31 pain , Lat grip R 13, L11 pain , 3 poin pinch R 9 , L 11 lbs pain -  - INCREASING but pain.  04/21/2021: continues to improve but still has mild pain   ? Time 3   ? Period Weeks   ? Status On-going   ? Target Date 05/12/21   ?  ? OT LONG TERM GOAL #3  ? Title Pain on PRWHE  outcome measure improve with more than 20 points   ? Baseline PRWHE pain score 38/50 for hand and wrist pain  NOW improving pain decrease R wrist 4/10 assess PRWHE next time. 04/21/2021 score 20/50   ? Time 3   ? Period Weeks   ? Status On-going   ? Target Date 05/12/21   ? ?  ?  ? ?  ? ? ? ? ? ? ? ? Plan - 05/04/21 1521   ? ? Clinical Impression Statement Patient referred to OT postop left carpal tunnel endoscopy 10/26/20-patient reports she had August  22 right carpal tunnel.  Patient made great progress since start of care with active range of motion in bilateral hands and wrists, grip strength and prehension strength as well as strength in upper body.  [place]

## 2021-06-01 ENCOUNTER — Ambulatory Visit: Payer: BC Managed Care – PPO | Attending: Surgery | Admitting: Occupational Therapy

## 2021-06-01 DIAGNOSIS — M25632 Stiffness of left wrist, not elsewhere classified: Secondary | ICD-10-CM | POA: Diagnosis present

## 2021-06-01 DIAGNOSIS — M25532 Pain in left wrist: Secondary | ICD-10-CM | POA: Insufficient documentation

## 2021-06-01 DIAGNOSIS — M25531 Pain in right wrist: Secondary | ICD-10-CM | POA: Diagnosis present

## 2021-06-01 DIAGNOSIS — M79641 Pain in right hand: Secondary | ICD-10-CM | POA: Insufficient documentation

## 2021-06-01 DIAGNOSIS — M79642 Pain in left hand: Secondary | ICD-10-CM | POA: Insufficient documentation

## 2021-06-01 DIAGNOSIS — M6281 Muscle weakness (generalized): Secondary | ICD-10-CM | POA: Insufficient documentation

## 2021-06-01 DIAGNOSIS — M25631 Stiffness of right wrist, not elsewhere classified: Secondary | ICD-10-CM | POA: Diagnosis present

## 2021-06-01 NOTE — Therapy (Signed)
[facility] [address] [place], [address] Phone: [phone]   Fax:  [phone]  Occupational Therapy Treatment  Patient Details  Name: Margaret Gates MRN: [id number] Date of Birth: 05-18-1980 Referring Provider (OT): Dr Roland Rack   Encounter Date: 06/01/2021   OT End of Session - 06/01/21 0958     Visit Number 20    Number of Visits 22    Date for OT Re-Evaluation 06/16/21    OT Start Time 0949    OT Stop Time 1027    OT Time Calculation (min) 38 min    Activity Tolerance Patient tolerated treatment well    Behavior During Therapy [facility] for tasks assessed/performed             Past Medical History:  Diagnosis Date   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HCC)    Asthma    Bronchitis    GERD (gastroesophageal reflux disease)    Headache    Severe sepsis ([place]) 01/26/2021   Tobacco use     Past Surgical History:  Procedure Laterality Date   CARPAL TUNNEL RELEASE Right 08/31/2020   Procedure: CARPAL TUNNEL RELEASE ENDOSCOPIC;  Surgeon: Corky Mull, MD;  Location: [facility] ORS;  Service: Orthopedics;  Laterality: Right;   CARPAL TUNNEL RELEASE Left 10/26/2020   Procedure: CARPAL TUNNEL RELEASE ENDOSCOPIC;  Surgeon: Corky Mull, MD;  Location: [facility] OR[facility];  Service: Orthopedics;  Laterality: Left;   CESAREAN SECTION     HIP SURGERY Bilateral    LAPROSCOPIC BONE SPURS    There were no vitals filed for this visit.   Subjective Assessment - 06/01/21 0958     Subjective  I did not see you for the last 2 weeks.  Because I had a shot of my right thumb after I seen Dr. Roland Rack  and it really hurts- it was swollen and red for like a week.  So I did  not go back and get my left one done.  And then my son was sick the week after.  My thumb still  hurts like a 3-4/10 - throbbing when trying to use them. I try and use them around the house. My pinkie's goes numb couple of time at night time and I even sleep with my elbow  straight. My neck bothers me today - don't know if I slept wrong    Pertinent History Pt was seen by Dr Roland Rack on 01/23/21 for follow up  12 weeks status post an endoscopic left carpal tunnel release ( 10/26/20).  R CTR done 08/31/20. Overall, the patient feels that she is doing reasonably well. However, she continues to experience moderate pain in her  L wrist which she rates at 5/10 on today's visit, and for which she will take ibuprofen and/or Tylenol as necessary with temporary partial relief of her symptoms. She localizes the pain to the aspect of her hand. In addition, she will continue to wear her brace at night as she finds this to be helpful in enabling her to sleep. She still notes difficulty lifting objects as well as some residual paresthesias to her fingertips. She denies any reinjury to the wrist or hand, and denies any fevers or chills. She has not yet returned to work, but does not feel that she can do the work that would be required of her, such as lifting 50 pounds.  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 Yes    Pain Score 4     Pain Location --   Bilateral thumbs - thenar eminence   Pain Orientation Right;Left    Pain Descriptors / Indicators Tender;Aching;Throbbing    Pain Type Surgical pain;Acute pain    Pain Onset More than a month ago    Pain Frequency Intermittent                [facility] OT Assessment - 06/01/21 0001       AROM   Right Wrist Extension 70 Degrees    Right Wrist Flexion 90 Degrees   composite 70 pain   Right Wrist Radial Deviation 20 Degrees    Right Wrist Ulnar Deviation 30 Degrees    Left Wrist Extension 70 Degrees    Left Wrist Flexion 90 Degrees   80 composite   Left Wrist Radial Deviation 20 Degrees    Left Wrist Ulnar Deviation 30 Degrees       Strength   Right Hand Grip (lbs) 75    Right Hand Lateral Pinch 20 lbs    Right Hand 3 Point Pinch 16 lbs    Left Hand Grip (lbs) 75    Left Hand Lateral Pinch 19 lbs    Left Hand 3 Point Pinch 16 lbs                Patient arrive after not being seen for about a month.  Patient seen Dr. Roland Rack on 05/05/2021.  At that time patient was referred for a shot in right thumb by Dr. Candelaria Stagers.  For possible tenosynovitis or tendinitis of the flexor of the thumb.  Patient report she did not go back for her left thumb.  Continues to have increased pain in bilateral thumbs with the right worse than the left.  Pain mostly on the thenar eminence of right thumb pain with gripping, pinching and tenderness. Arriving patient's pain is a 4/10 in right thumb. Patient is active range of motion is within normal limits for bilateral wrist and thumbs and digits.  Do have decreased composite flexion of wrist with loose fist. Grip and prehension increase and above range for her age. See flowsheets for range of motion and strength assessment.   Wrist strength with resistance at end range strength 5/5.  Except pronation on the left decreased to a 4+. Patient able to carry at sides 15 pounds in each hand with no difficulty but some discomfort at webspaces of thumbs.     Patient this date was able to carry at side of body 10-20 pounds kettle bells. She could lift and carry 30 pounds bilaterally from knees to waist height.  And bilaterally from waist height to top shelf overhead but reports some pain in her neck today. Patient feels like she slept wrong but also upon questioning reports she was told in the past she had a bulging disc.  Did not push more heavier weights this date because of bilateral thumb pain continued to be the same than a month ago as well as neck pain today reported. Patient did report a couple of times she had fifth finger numbness at night.  Did review before with her positioning of elbow and  cubital tunnel during sleeping as well as propping up.  Patient do  have some tenderness over the left cubital tunnel with positive Tinel more than the right.   Patient to follow-up with me 1 more time before seeing Dr. Roland Rack in 2 weeks             OT Education - 06/01/21 1029     Education Details progress,   simulations of activities for work and home    Person(s) Educated Patient    Methods Explanation;Demonstration;Tactile cues;Verbal cues;Handout    Comprehension Verbal cues required;Returned demonstration;Verbalized understanding              OT Short Term Goals - 03/21/21 1033       OT SHORT TERM GOAL #1   Title Pt and husband to be independent in HEP to decrease scar tissue and pain to less than 2/10  , and increase AROM    Status Achieved               OT Long Term Goals - 06/01/21 1129       OT LONG TERM GOAL #1   Title Bilateral wrist AROM increase to WNL and pain free to push up from chair , push and pull door open    Baseline decrease wrist AROM in all planes and pain- still some pain R wrist 4/10 - AROM WNL,  NOW AROM within normal limits with pain continues to be a 3-4/10 over thumb thenar eminence    Time 2    Period Weeks    Status On-going    Target Date 06/16/21      OT LONG TERM GOAL #2   Title Pt' s bilateral  grip and prehension strenght increase within her range for her age to carry more than 10 lbs, push , pull  heavy door  without symptoms    Baseline Grip R51, L 31 pain , Lat grip R 13, L11 pain , 3 poin pinch R 9 , L 11 lbs pain -  NOW grip and prehension strength in bilateral hands in top range for her age can carry 15 pounds in each hand and overhead 30 pounds on palms but pain over thumb thenar eminence with pushing and pulling 3-4/10    Time 2    Period Weeks    Status On-going    Target Date 06/16/21      OT LONG TERM GOAL #3   Title Pain on PRWHE  outcome measure improve with more than 20 points    Baseline PRWHE pain score  38/50 for hand and wrist pain  NOW improving pain decrease R wrist 4/10 assess PRWHE next time. NOW cont to score 20/50    Time 2    Period Weeks    Status On-going    Target Date 06/16/21                   Plan - 06/01/21 0959     Clinical Impression Statement Patient referred to OT postop left carpal tunnel endoscopy 10/26/20-patient reports she had August 22 right carpal tunnel.  Patient made great progress since start of care with active range of motion in bilateral hands and wrists, grip strength and prehension strength as well as strength in upper body.  Patient seen Dr. Roland Rack last on 05/05/2021 at that time had shots by Dr. Candelaria Stagers in the right thumb flexors.  Patient returns today after not seen for about a month.  Patient report continues pain in bilateral thumbs with the right they are worse than the left.  She  reports she did not go back for a shot in the left because of increased pain in the right thumb.  This date patient is active range of motion of wrist is within normal limits as well as strength 5/5 in wrist and forearm with left pronation a little bit weaker.  Grip and prehension strength is within normal range for her age and in the above average.  She continues to be able to carry 15 pounds in each hand and pick up bilaterally 30 pounds overhead but  hard and this date some neck pain reported.   DId not had pt do 40 lbs at waist level. Patient  to cont practicing at home lifting and carry larger objects of 30 to 40 pounds bilaterally from knees to waist level using good body mechanics and pain free in neck,. Pt report cont pain and spasms in thumbs. Pt to follow up with me in 2 wks prior to surgeon appt - cannot increase weight trng over head -thumb pain cont the same and neck pain this date - think she slept wrong - but per pt she was told in past - she has bulging disc in neck.  Pt not back to work yet - has to be able to pick up and carry 50 lbs - unable to accommodate her at  work in other position or light duty. Pt to see DR Poggi on 16th June again for follow up.    OT Occupational Profile and History Problem Focused Assessment - Including review of records relating to presenting problem    Occupational performance deficits (Please refer to evaluation for details): ADL's;IADL's;Rest and Sleep;Work;Play;Leisure;Social Participation    Body Structure / Function / Physical Skills ADL;Strength;Pain;UE functional use;ROM;IADL;Scar mobility;Sensation;Decreased knowledge of precautions    Rehab Potential Good    Clinical Decision Making Limited treatment options, no task modification necessary    Comorbidities Affecting Occupational Performance: None    Modification or Assistance to Complete Evaluation  No modification of tasks or assist necessary to complete eval    OT Frequency 1x / week    OT Duration 2 weeks    OT Treatment/Interventions Self-care/ADL training;Paraffin;Ultrasound;Moist Heat;Fluidtherapy;Contrast Bath;Therapeutic exercise;Manual Therapy;Patient/family education;Passive range of motion;Scar mobilization;Therapeutic activities;Splinting;DME and/or AE instruction;Iontophoresis    Consulted and Agree with Plan of Care Patient             Patient will benefit from skilled therapeutic intervention in order to improve the following deficits and impairments:   Body Structure / Function / Physical Skills: ADL, Strength, Pain, UE functional use, ROM, IADL, Scar mobility, Sensation, Decreased knowledge of precautions       Visit Diagnosis: Pain in left hand - Plan: Ot plan of care cert/re-cert  Pain in right hand - Plan: Ot plan of care cert/re-cert  Muscle weakness (generalized) - Plan: Ot plan of care cert/re-cert  Pain in right wrist - Plan: Ot plan of care cert/re-cert  Pain in left wrist - Plan: Ot plan of care cert/re-cert  Stiffness of right wrist, not elsewhere classified - Plan: Ot plan of care cert/re-cert  Stiffness of left wrist, not  elsewhere classified - Plan: Ot plan of care cert/re-cert    Problem List Patient Active Problem List   Diagnosis Date Noted   Adrenal insufficiency ([place]) 01/26/2021   Asthma 01/26/2021   GERD (gastroesophageal reflux disease) 01/26/2021   Tobacco use 01/26/2021   CAP (community acquired pneumonia) 01/26/2021   Carpal tunnel syndrome, right 08/19/2020   Tear of left acetabular labrum 05/06/2015  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 [place], [address] Phone: [phone]   Fax:  [phone]  Name: Margaret Gates MRN: [id number] Date of Birth: [birth year]

## 2021-06-15 ENCOUNTER — Ambulatory Visit: Payer: BC Managed Care – PPO | Admitting: Occupational Therapy

## 2021-06-15 DIAGNOSIS — M25531 Pain in right wrist: Secondary | ICD-10-CM

## 2021-06-15 DIAGNOSIS — M6281 Muscle weakness (generalized): Secondary | ICD-10-CM

## 2021-06-15 DIAGNOSIS — M25532 Pain in left wrist: Secondary | ICD-10-CM

## 2021-06-15 DIAGNOSIS — M79642 Pain in left hand: Secondary | ICD-10-CM | POA: Diagnosis not present

## 2021-06-15 DIAGNOSIS — M79641 Pain in right hand: Secondary | ICD-10-CM

## 2021-06-15 DIAGNOSIS — M25631 Stiffness of right wrist, not elsewhere classified: Secondary | ICD-10-CM

## 2021-06-15 DIAGNOSIS — M25632 Stiffness of left wrist, not elsewhere classified: Secondary | ICD-10-CM

## 2021-06-15 NOTE — Therapy (Signed)
[facility] [address], [place], [address] Phone: [phone]   Fax:  [phone]  Occupational Therapy Treatment  Patient Details  Name: Margaret Gates MRN: [id number] Date of Birth: Sep 19, 1980 Referring Provider (OT): Dr Roland Rack   Encounter Date: 06/15/2021   OT End of Session - 06/15/21 1846     Visit Number 21    Number of Visits 22    Date for OT Re-Evaluation 06/16/21    OT Start Time 1530    OT Stop Time 1614    OT Time Calculation (min) 44 min    Activity Tolerance Patient tolerated treatment well    Behavior During Therapy [facility] for tasks assessed/performed             Past Medical History:  Diagnosis Date   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HCC)    Asthma    Bronchitis    GERD (gastroesophageal reflux disease)    Headache    Severe sepsis ([place]) 01/26/2021   Tobacco use     Past Surgical History:  Procedure Laterality Date   CARPAL TUNNEL RELEASE Right 08/31/2020   Procedure: CARPAL TUNNEL RELEASE ENDOSCOPIC;  Surgeon: Corky Mull, MD;  Location: [facility] ORS;  Service: Orthopedics;  Laterality: Right;   CARPAL TUNNEL RELEASE Left 10/26/2020   Procedure: CARPAL TUNNEL RELEASE ENDOSCOPIC;  Surgeon: Corky Mull, MD;  Location: [facility] ORS;  Service: Orthopedics;  Laterality: Left;   CESAREAN SECTION     HIP SURGERY Bilateral    LAPROSCOPIC BONE SPURS    There were no vitals filed for this visit.   Subjective Assessment - 06/15/21 1843     Subjective  I have been doing my exercises at home and I have been pushing myself picking up objects that is about 10 pounds as well as the dog food bags.  My palms of my thumbs just keeps on hurting so bad if I overdo it or trying to do more.  Feeling my motion is okay, my sttrength is slowly coming back but I am not close to 40 -50 pounds  carrying -  and then over head picking up not at all    Pertinent History Pt was seen by Dr Roland Rack on 01/23/21 for follow  up  12 weeks status post an endoscopic left carpal tunnel release ( 10/26/20).  R CTR done 08/31/20. Overall, the patient feels that she is doing reasonably well. However, she continues to experience moderate pain in her  L wrist which she rates at 5/10 on today's visit, and for which she will take ibuprofen and/or Tylenol as necessary with temporary partial relief of her symptoms. She localizes the pain to the aspect of her hand. In addition, she will continue to wear her brace at night as she finds this to be helpful in enabling her to sleep. She still notes difficulty lifting objects as well as some residual paresthesias to her fingertips. She denies any reinjury to the wrist or hand, and denies any fevers or chills. She has not yet returned to work, but does not feel that she can do the work that would be required of her, such as lifting 50 pounds.  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 Yes    Pain Score 3     Pain Location Hand    Pain Orientation Right;Left    Pain Descriptors / Indicators Tender;Tightness;Aching    Pain Type Chronic pain;Surgical pain    Pain Onset More than a month ago    Pain Frequency Intermittent                [facility] OT Assessment - 06/15/21 0001       AROM   Right Wrist Extension 70 Degrees    Right Wrist Flexion 85 Degrees    Right Wrist Radial Deviation 20 Degrees    Right Wrist Ulnar Deviation 35 Degrees    Left Wrist Extension 70 Degrees    Left Wrist Flexion 90 Degrees    Left Wrist Radial Deviation 20 Degrees    Left Wrist Ulnar Deviation 40 Degrees      Strength   Right Hand Grip (lbs) 79    Right Hand Lateral Pinch 20 lbs    Right Hand 3 Point Pinch 16 lbs    Left Hand Grip (lbs) 78    Left Hand Lateral Pinch 19 lbs    Left Hand 3 Point  Pinch 18 lbs                Patient seen twice since her appointment with Dr. Roland Rack on 05/05/2021.  At that time patient was referred for a shot in right thumb by Dr. Candelaria Stagers.  For possible tenosynovitis or tendinitis of the flexor of the thumb.  Patient report she did not go back for her left thumb.  Because of increased pain.  Continues to have increased pain in bilateral thumbs over thenar eminence.  As well as over carpal tunnel region and proximal palm.   Arriving patient's pain is a 4/10 in bilateral thumbs and palms.   Patient reports she has been trying to push herself at home picking up and carrying more heavier things but feels like pain is just lingering and limiting her in her progress.  Patient feels she is still about carrying 20 to 30 pounds with pain pushing overhead not able to do 40 or 50 pounds close to it.    Bilateral wrist active range of motion and strength is within normal limits.  Able to make composite fist with no pain as well as opposition within normal limits.   Grip and prehension increase is in top percentile for her age. See flowsheets for range of motion and strength assessment.     Wrist strength with resistance at end range strength 5/5.  Except pronation on the left decreased to a 4+. Patient able to carry at sides 15 pounds in each hand with no difficulty but some discomfort at thumbs  Patient this date was able to carry at side of body 10-20 pounds kettle bells. She could lift and carry 30 pounds bilaterally from knees to waist height.  And bilaterally from waist height to top shelf overhead but reports some discomfort in upper back. Patient able to pick up 40 pounds to waist level but hard and had to put it down feels like she is dropping it.  Patient reports numbness last night in right hand.  Appear it happened before in the past.   Patient to follow-up with surgeon tomorrow.                                 OT  Education -  06/15/21 1846     Education Details progress,   simulations of activities for work and home    Person(s) Educated Patient    Methods Explanation;Demonstration;Tactile cues;Verbal cues;Handout    Comprehension Verbal cues required;Returned demonstration;Verbalized understanding              OT Short Term Goals - 03/21/21 1033       OT SHORT TERM GOAL #1   Title Pt and husband to be independent in HEP to decrease scar tissue and pain to less than 2/10  , and increase AROM    Status Achieved               OT Long Term Goals - 06/01/21 1129       OT LONG TERM GOAL #1   Title Bilateral wrist AROM increase to WNL and pain free to push up from chair , push and pull door open    Baseline decrease wrist AROM in all planes and pain- still some pain R wrist 4/10 - AROM WNL,  NOW AROM within normal limits with pain continues to be a 3-4/10 over thumb thenar eminence    Time 2    Period Weeks    Status On-going    Target Date 06/16/21      OT LONG TERM GOAL #2   Title Pt' s bilateral  grip and prehension strenght increase within her range for her age to carry more than 10 lbs, push , pull  heavy door  without symptoms    Baseline Grip R51, L 31 pain , Lat grip R 13, L11 pain , 3 poin pinch R 9 , L 11 lbs pain -  NOW grip and prehension strength in bilateral hands in top range for her age can carry 15 pounds in each hand and overhead 30 pounds on palms but pain over thumb thenar eminence with pushing and pulling 3-4/10    Time 2    Period Weeks    Status On-going    Target Date 06/16/21      OT LONG TERM GOAL #3   Title Pain on PRWHE  outcome measure improve with more than 20 points    Baseline PRWHE pain score 38/50 for hand and wrist pain  NOW improving pain decrease R wrist 4/10 assess PRWHE next time. NOW cont to score 20/50    Time 2    Period Weeks    Status On-going    Target Date 06/16/21                   Plan - 06/15/21 1846     Clinical Impression  Statement Patient referred to OT postop left carpal tunnel endoscopy 10/26/20-patient reports she had August 22 right carpal tunnel.  Patient made great progress since start of care with active range of motion in bilateral hands and wrists, grip strength and prehension strength as well as strength in upper body.  Patient seen Dr. Roland Rack last on 05/05/2021 at that time had shots by Dr. Candelaria Stagers in the right thumb flexors.  Patient was seen since doctors appointment 2 times.  Patient continues to be limited by bilateral thumb pain in thenar eminence as well as over proximal palm to carpal tunnel area.  Patient had increased pain after last shot did not return for other hand.  Patient continues to be pushing herself to pick up and carry in grip heavier objects but continues to be limited by pain mostly.  Patient continues to  show active range of motion of wrist and digits WN as well as strength 5/5 in wrist and forearm with left pronation a little bit weaker.  Grip and prehension strength is within normal range for her age top range for her age.  She continues to be able to carry 15 pounds in each hand and pick up bilaterally 30 pounds overhead..  Ble to carry 40 lbs wit effort and felt like she is going to drop it. Pt report cont pain and spasms in thumbs.  Patient do report that her hands are numb sometimes at nighttime last night was the right hand.  Patient negative for cubital tunnel as well as Tinel over carpal tunnel.  Denies neck pain.  Patient to follow-up with surgeon tomorrow.  Pt not back to work yet - has to be able to pick up and carry 50 lbs - unable to accommodate her at work in other position or light duty. Pt to see DR Poggi on 16th June again for follow up.    OT Occupational Profile and History Problem Focused Assessment - Including review of records relating to presenting problem    Occupational performance deficits (Please refer to evaluation for details): ADL's;IADL's;Rest and  Sleep;Work;Play;Leisure;Social Participation    Body Structure / Function / Physical Skills ADL;Strength;Pain;UE functional use;ROM;IADL;Scar mobility;Sensation;Decreased knowledge of precautions    Rehab Potential Good    Clinical Decision Making Limited treatment options, no task modification necessary    Comorbidities Affecting Occupational Performance: None    Modification or Assistance to Complete Evaluation  No modification of tasks or assist necessary to complete eval    OT Frequency Biweekly    OT Duration 4 weeks    OT Treatment/Interventions Self-care/ADL training;Paraffin;Ultrasound;Moist Heat;Fluidtherapy;Contrast Bath;Therapeutic exercise;Manual Therapy;Patient/family education;Passive range of motion;Scar mobilization;Therapeutic activities;Splinting;DME and/or AE instruction;Iontophoresis    Consulted and Agree with Plan of Care Patient             Patient will benefit from skilled therapeutic intervention in order to improve the following deficits and impairments:   Body Structure / Function / Physical Skills: ADL, Strength, Pain, UE functional use, ROM, IADL, Scar mobility, Sensation, Decreased knowledge of precautions       Visit Diagnosis: Pain in left hand  Pain in right hand  Muscle weakness (generalized)  Pain in right wrist  Pain in left wrist  Stiffness of right wrist, not elsewhere classified  Stiffness of left wrist, not elsewhere classified    Problem List Patient Active Problem List   Diagnosis Date Noted   Adrenal insufficiency ([place]) 01/26/2021   Asthma 01/26/2021   GERD (gastroesophageal reflux disease) 01/26/2021   Tobacco use 01/26/2021   CAP (community acquired pneumonia) 01/26/2021   Carpal tunnel syndrome, right 08/19/2020   Tear of left acetabular labrum 05/06/2015   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 [place], [address] Phone: [phone]   Fax:  [phone]  Name: Shirl Weir MRN: [id number] Date of Birth: Dec 17, 1980

## 2021-08-18 DIAGNOSIS — G5621 Lesion of ulnar nerve, right upper limb: Secondary | ICD-10-CM | POA: Insufficient documentation

## 2021-08-21 ENCOUNTER — Other Ambulatory Visit: Payer: Self-pay | Admitting: Surgery

## 2021-08-22 ENCOUNTER — Encounter
Admission: RE | Disposition: A | Discharge status: Home / Self Care | Payer: Self-pay | Source: Ambulatory Visit | Attending: Surgery

## 2021-08-22 ENCOUNTER — Ambulatory Visit: Payer: BC Managed Care – PPO | Admitting: Anesthesiology

## 2021-08-22 ENCOUNTER — Other Ambulatory Visit: Payer: Self-pay

## 2021-08-22 ENCOUNTER — Encounter: Payer: Self-pay | Admitting: Surgery

## 2021-08-22 ENCOUNTER — Ambulatory Visit
Admission: RE | Admit: 2021-08-22 | Discharge: 2021-08-22 | Disposition: A | Discharge status: Home / Self Care | Payer: BC Managed Care – PPO | Source: Ambulatory Visit | Attending: Surgery | Admitting: Surgery

## 2021-08-22 DIAGNOSIS — E2740 Unspecified adrenocortical insufficiency: Secondary | ICD-10-CM | POA: Diagnosis not present

## 2021-08-22 DIAGNOSIS — G5601 Carpal tunnel syndrome, right upper limb: Secondary | ICD-10-CM | POA: Diagnosis present

## 2021-08-22 DIAGNOSIS — Z419 Encounter for procedure for purposes other than remedying health state, unspecified: Secondary | ICD-10-CM

## 2021-08-22 DIAGNOSIS — G5621 Lesion of ulnar nerve, right upper limb: Secondary | ICD-10-CM | POA: Diagnosis not present

## 2021-08-22 DIAGNOSIS — K219 Gastro-esophageal reflux disease without esophagitis: Secondary | ICD-10-CM | POA: Diagnosis not present

## 2021-08-22 HISTORY — PX: ULNAR NERVE TRANSPOSITION: SHX2595

## 2021-08-22 HISTORY — PX: CARPAL TUNNEL RELEASE: SHX101

## 2021-08-22 LAB — POCT PREGNANCY, URINE: Preg Test, Ur: NEGATIVE

## 2021-08-22 SURGERY — CARPAL TUNNEL RELEASE
Anesthesia: General | Site: Wrist | Laterality: Right

## 2021-08-22 MED ORDER — ONDANSETRON HCL 4 MG/2ML IJ SOLN
INTRAMUSCULAR | Status: AC
  Filled 2021-08-22: qty 2

## 2021-08-22 MED ORDER — OXYCODONE HCL 5 MG PO TABS
ORAL_TABLET | ORAL | Status: AC
  Filled 2021-08-22: qty 2

## 2021-08-22 MED ORDER — CHLORHEXIDINE GLUCONATE 0.12 % MT SOLN
OROMUCOSAL | Status: AC
  Administered 2021-08-22: 15 mL via OROMUCOSAL
  Filled 2021-08-22: qty 15

## 2021-08-22 MED ORDER — ACETAMINOPHEN 10 MG/ML IV SOLN: 1000.0000 mg | Freq: Once | INTRAVENOUS | Status: DC | PRN

## 2021-08-22 MED ORDER — ACETAMINOPHEN 500 MG PO TABS: 1000.0000 mg | ORAL_TABLET | Freq: Once | ORAL | Status: AC

## 2021-08-22 MED ORDER — FENTANYL CITRATE (PF) 100 MCG/2ML IJ SOLN
INTRAMUSCULAR | Status: AC
  Filled 2021-08-22: qty 2

## 2021-08-22 MED ORDER — PHENYLEPHRINE 80 MCG/ML (10ML) SYRINGE FOR IV PUSH (FOR BLOOD PRESSURE SUPPORT)
PREFILLED_SYRINGE | INTRAVENOUS | Status: DC | PRN
  Administered 2021-08-22 (×2): 80 ug via INTRAVENOUS

## 2021-08-22 MED ORDER — KETAMINE HCL 10 MG/ML IJ SOLN
INTRAMUSCULAR | Status: DC | PRN
  Administered 2021-08-22: 20 mg via INTRAVENOUS

## 2021-08-22 MED ORDER — CELECOXIB 200 MG PO CAPS
ORAL_CAPSULE | ORAL | Status: AC
  Administered 2021-08-22: 200 mg via ORAL
  Filled 2021-08-22: qty 1

## 2021-08-22 MED ORDER — GABAPENTIN 600 MG PO TABS
300.0000 mg | ORAL_TABLET | Freq: Once | ORAL | Status: DC
  Filled 2021-08-22: qty 0.5

## 2021-08-22 MED ORDER — KETOROLAC TROMETHAMINE 30 MG/ML IJ SOLN
30.0000 mg | Freq: Once | INTRAMUSCULAR | Status: AC
  Administered 2021-08-22: 30 mg via INTRAVENOUS

## 2021-08-22 MED ORDER — METOCLOPRAMIDE HCL 10 MG PO TABS: 5.0000 mg | ORAL_TABLET | Freq: Three times a day (TID) | ORAL | Status: DC | PRN

## 2021-08-22 MED ORDER — ONDANSETRON HCL 4 MG/2ML IJ SOLN
INTRAMUSCULAR | Status: DC | PRN
  Administered 2021-08-22: 4 mg via INTRAVENOUS

## 2021-08-22 MED ORDER — DEXMEDETOMIDINE (PRECEDEX) IN NS 20 MCG/5ML (4 MCG/ML) IV SYRINGE
PREFILLED_SYRINGE | INTRAVENOUS | Status: DC | PRN
  Administered 2021-08-22 (×5): 4 ug via INTRAVENOUS

## 2021-08-22 MED ORDER — OXYCODONE HCL 5 MG PO TABS: 5.0000 mg | ORAL_TABLET | Freq: Once | ORAL | Status: DC | PRN

## 2021-08-22 MED ORDER — KETAMINE HCL 50 MG/5ML IJ SOSY
PREFILLED_SYRINGE | INTRAMUSCULAR | Status: AC
  Filled 2021-08-22: qty 5

## 2021-08-22 MED ORDER — CEFAZOLIN SODIUM-DEXTROSE 2-4 GM/100ML-% IV SOLN
2.0000 g | INTRAVENOUS | Status: AC
  Administered 2021-08-22: 2 g via INTRAVENOUS

## 2021-08-22 MED ORDER — CELECOXIB 200 MG PO CAPS: 200.0000 mg | ORAL_CAPSULE | Freq: Once | ORAL | Status: AC

## 2021-08-22 MED ORDER — DROPERIDOL 2.5 MG/ML IJ SOLN: 0.6250 mg | Freq: Once | INTRAMUSCULAR | Status: DC | PRN

## 2021-08-22 MED ORDER — LACTATED RINGERS IV SOLN: INTRAVENOUS | Status: DC

## 2021-08-22 MED ORDER — DEXAMETHASONE SODIUM PHOSPHATE 10 MG/ML IJ SOLN
INTRAMUSCULAR | Status: DC | PRN
  Administered 2021-08-22: 10 mg via INTRAVENOUS

## 2021-08-22 MED ORDER — MIDAZOLAM HCL 2 MG/2ML IJ SOLN
INTRAMUSCULAR | Status: AC
  Filled 2021-08-22: qty 2

## 2021-08-22 MED ORDER — ACETAMINOPHEN 500 MG PO TABS
ORAL_TABLET | ORAL | Status: AC
  Administered 2021-08-22: 1000 mg via ORAL
  Filled 2021-08-22: qty 2

## 2021-08-22 MED ORDER — OXYCODONE HCL 5 MG/5ML PO SOLN: 5.0000 mg | Freq: Once | ORAL | Status: DC | PRN

## 2021-08-22 MED ORDER — PROMETHAZINE HCL 25 MG/ML IJ SOLN: 6.2500 mg | INTRAMUSCULAR | Status: DC | PRN

## 2021-08-22 MED ORDER — ONDANSETRON HCL 4 MG/2ML IJ SOLN: 4.0000 mg | Freq: Four times a day (QID) | INTRAMUSCULAR | Status: DC | PRN

## 2021-08-22 MED ORDER — CEFAZOLIN SODIUM-DEXTROSE 2-4 GM/100ML-% IV SOLN
INTRAVENOUS | Status: AC
  Filled 2021-08-22: qty 100

## 2021-08-22 MED ORDER — BUPIVACAINE HCL (PF) 0.5 % IJ SOLN
INTRAMUSCULAR | Status: AC
  Filled 2021-08-22: qty 30

## 2021-08-22 MED ORDER — GABAPENTIN 300 MG PO CAPS
ORAL_CAPSULE | ORAL | Status: AC
  Administered 2021-08-22: 300 mg
  Filled 2021-08-22: qty 1

## 2021-08-22 MED ORDER — IPRATROPIUM-ALBUTEROL 0.5-2.5 (3) MG/3ML IN SOLN
3.0000 mL | Freq: Once | RESPIRATORY_TRACT | Status: AC
Start: 2021-08-22 — End: 2021-08-22

## 2021-08-22 MED ORDER — 0.9 % SODIUM CHLORIDE (POUR BTL) OPTIME
TOPICAL | Status: DC | PRN
  Administered 2021-08-22: 500 mL

## 2021-08-22 MED ORDER — FENTANYL CITRATE (PF) 100 MCG/2ML IJ SOLN: 25.0000 ug | INTRAMUSCULAR | Status: DC | PRN

## 2021-08-22 MED ORDER — ORAL CARE MOUTH RINSE: 15.0000 mL | Freq: Once | OROMUCOSAL | Status: AC

## 2021-08-22 MED ORDER — CHLORHEXIDINE GLUCONATE 0.12 % MT SOLN
15.0000 mL | Freq: Once | OROMUCOSAL | Status: AC
Start: 2021-08-22 — End: 2021-08-22

## 2021-08-22 MED ORDER — KETOROLAC TROMETHAMINE 15 MG/ML IJ SOLN
INTRAMUSCULAR | Status: AC
  Filled 2021-08-22: qty 1

## 2021-08-22 MED ORDER — KETOROLAC TROMETHAMINE 30 MG/ML IJ SOLN
INTRAMUSCULAR | Status: AC
  Filled 2021-08-22: qty 1

## 2021-08-22 MED ORDER — MIDAZOLAM HCL 2 MG/2ML IJ SOLN
INTRAMUSCULAR | Status: DC | PRN
  Administered 2021-08-22: 2 mg via INTRAVENOUS

## 2021-08-22 MED ORDER — FENTANYL CITRATE (PF) 100 MCG/2ML IJ SOLN
INTRAMUSCULAR | Status: DC | PRN
  Administered 2021-08-22: 50 ug via INTRAVENOUS
  Administered 2021-08-22 (×2): 25 ug via INTRAVENOUS

## 2021-08-22 MED ORDER — OXYCODONE HCL 5 MG PO TABS
5.0000 mg | ORAL_TABLET | ORAL | Status: DC | PRN
  Administered 2021-08-22: 10 mg via ORAL

## 2021-08-22 MED ORDER — METOCLOPRAMIDE HCL 5 MG/ML IJ SOLN: 5.0000 mg | Freq: Three times a day (TID) | INTRAMUSCULAR | Status: DC | PRN

## 2021-08-22 MED ORDER — OXYCODONE HCL 5 MG PO TABS: 5.0000 mg | ORAL_TABLET | ORAL | 0 refills | Status: DC | PRN

## 2021-08-22 MED ORDER — DEXAMETHASONE SODIUM PHOSPHATE 10 MG/ML IJ SOLN
INTRAMUSCULAR | Status: AC
  Filled 2021-08-22: qty 1

## 2021-08-22 MED ORDER — PROPOFOL 10 MG/ML IV BOLUS
INTRAVENOUS | Status: AC
  Filled 2021-08-22: qty 20

## 2021-08-22 MED ORDER — IPRATROPIUM-ALBUTEROL 0.5-2.5 (3) MG/3ML IN SOLN
RESPIRATORY_TRACT | Status: AC
  Administered 2021-08-22: 3 mL via RESPIRATORY_TRACT
  Filled 2021-08-22: qty 3

## 2021-08-22 MED ORDER — ONDANSETRON HCL 4 MG PO TABS: 4.0000 mg | ORAL_TABLET | Freq: Four times a day (QID) | ORAL | Status: DC | PRN

## 2021-08-22 MED ORDER — PROPOFOL 10 MG/ML IV BOLUS
INTRAVENOUS | Status: DC | PRN
  Administered 2021-08-22: 200 mg via INTRAVENOUS

## 2021-08-22 MED ORDER — POTASSIUM CHLORIDE IN NACL 20-0.9 MEQ/L-% IV SOLN
INTRAVENOUS | Status: DC
  Filled 2021-08-22 (×5): qty 1000

## 2021-08-22 MED ORDER — LIDOCAINE HCL (PF) 2 % IJ SOLN
INTRAMUSCULAR | Status: AC
  Filled 2021-08-22: qty 5

## 2021-08-22 MED ORDER — BUPIVACAINE HCL (PF) 0.5 % IJ SOLN
INTRAMUSCULAR | Status: DC | PRN
  Administered 2021-08-22 (×2): 10 mL

## 2021-08-22 MED ORDER — LIDOCAINE HCL (CARDIAC) PF 100 MG/5ML IV SOSY
PREFILLED_SYRINGE | INTRAVENOUS | Status: DC | PRN
  Administered 2021-08-22: 100 mg via INTRAVENOUS

## 2021-08-22 SURGICAL SUPPLY — 50 items
ADHESIVE MASTISOL STRL (MISCELLANEOUS) IMPLANT
BNDG COHESIVE 4X5 TAN STRL LF (GAUZE/BANDAGES/DRESSINGS) ×2 IMPLANT
BNDG ELASTIC 2X5.8 VLCR STR LF (GAUZE/BANDAGES/DRESSINGS) IMPLANT
BNDG ELASTIC 3X5.8 VLCR STR LF (GAUZE/BANDAGES/DRESSINGS) IMPLANT
BNDG ELASTIC 4X5.8 VLCR NS LF (GAUZE/BANDAGES/DRESSINGS) ×2 IMPLANT
BNDG ESMARK 4X12 TAN STRL LF (GAUZE/BANDAGES/DRESSINGS) ×2 IMPLANT
CHLORAPREP W/TINT 26 (MISCELLANEOUS) ×4 IMPLANT
CORD BIP STRL DISP 12FT (MISCELLANEOUS) ×2 IMPLANT
CUFF TOURN SGL QUICK 18X4 (TOURNIQUET CUFF) IMPLANT
CUFF TOURN SGL QUICK 24 (TOURNIQUET CUFF)
CUFF TRNQT CYL 24X4X16.5-23 (TOURNIQUET CUFF) IMPLANT
DRAPE SURG 17X11 SM STRL (DRAPES) ×4 IMPLANT
ELECT REM PT RETURN 9FT ADLT (ELECTROSURGICAL) ×2
ELECTRODE REM PT RTRN 9FT ADLT (ELECTROSURGICAL) ×2 IMPLANT
FORCEPS JEWEL BIP 4-3/4 STR (INSTRUMENTS) ×2 IMPLANT
GAUZE SPONGE 4X4 12PLY STRL (GAUZE/BANDAGES/DRESSINGS) ×2 IMPLANT
GAUZE XEROFORM 1X8 LF (GAUZE/BANDAGES/DRESSINGS) ×2 IMPLANT
GLOVE BIO SURGEON STRL SZ8 (GLOVE) ×4 IMPLANT
GLOVE SURG UNDER LTX SZ8 (GLOVE) ×2 IMPLANT
GOWN STRL REUS W/ TWL LRG LVL3 (GOWN DISPOSABLE) ×2 IMPLANT
GOWN STRL REUS W/ TWL XL LVL3 (GOWN DISPOSABLE) ×2 IMPLANT
GOWN STRL REUS W/TWL LRG LVL3 (GOWN DISPOSABLE) ×2
GOWN STRL REUS W/TWL XL LVL3 (GOWN DISPOSABLE) ×2
KIT TURNOVER KIT A (KITS) ×2 IMPLANT
LOOP RED MAXI  1X406MM (MISCELLANEOUS) ×2
LOOP VESSEL MAXI 1X406 RED (MISCELLANEOUS) ×2 IMPLANT
MANIFOLD NEPTUNE II (INSTRUMENTS) ×2 IMPLANT
NDL HYPO 25X1 1.5 SAFETY (NEEDLE) ×2 IMPLANT
NEEDLE HYPO 25X1 1.5 SAFETY (NEEDLE) ×2 IMPLANT
NS IRRIG 500ML POUR BTL (IV SOLUTION) ×2 IMPLANT
PACK EXTREMITY ARMC (MISCELLANEOUS) ×2 IMPLANT
PAD ABD DERMACEA PRESS 5X9 (GAUZE/BANDAGES/DRESSINGS) ×2 IMPLANT
PAD CAST CTTN 4X4 STRL (SOFTGOODS) ×6 IMPLANT
PADDING CAST COTTON 4X4 STRL (SOFTGOODS)
SPLINT WRIST LG LT TX990309 (SOFTGOODS) ×2 IMPLANT
SPLINT WRIST M LT TX990308 (SOFTGOODS) ×2 IMPLANT
SPLINT WRIST M RT TX990303 (SOFTGOODS) ×2 IMPLANT
STAPLER SKIN PROX 35W (STAPLE) IMPLANT
STOCKINETTE 48X4 2 PLY STRL (GAUZE/BANDAGES/DRESSINGS) ×2 IMPLANT
STOCKINETTE IMPERVIOUS 9X36 MD (GAUZE/BANDAGES/DRESSINGS) ×2 IMPLANT
STOCKINETTE STRL 4IN 9604848 (GAUZE/BANDAGES/DRESSINGS) ×2 IMPLANT
STRIP CLOSURE SKIN 1/4X4 (GAUZE/BANDAGES/DRESSINGS) IMPLANT
SUT PROLENE 4 0 PS 2 18 (SUTURE) ×2 IMPLANT
SUT VIC AB 2-0 CT1 (SUTURE) ×2 IMPLANT
SUT VIC AB 2-0 CT1 36 (SUTURE) IMPLANT
SUT VIC AB 3-0 SH 27 (SUTURE) ×2
SUT VIC AB 3-0 SH 27X BRD (SUTURE) IMPLANT
SWABSTK COMLB BENZOIN TINCTURE (MISCELLANEOUS) ×2 IMPLANT
TRAP FLUID SMOKE EVACUATOR (MISCELLANEOUS) ×2 IMPLANT
WATER STERILE IRR 500ML POUR (IV SOLUTION) ×2 IMPLANT

## 2021-08-22 NOTE — Op Note (Signed)
08/22/2021  4:18 PM  Patient:   Margaret Gates  Pre-Op Diagnosis:   1.  Recurrent right carpal tunnel syndrome.  2.  Right cubital tunnel syndrome.  Post-Op Diagnosis:   Same.  Procedure:   1.  Open right carpal tunnel release.  2.  Subcutaneous anterior transposition of ulnar nerve, right elbow.  Surgeon:   Pascal Lux, MD  Anesthesia:   General LMA  Findings:   As above.  Complications:   None  EBL:   1 cc  Fluids:   450 cc crystalloid  TT:   70 minutes at 250 mmHg  Drains:   None  Closure:   4-0 Prolene interrupted sutures for wrist and 4-0 Vicryl subcuticular sutures for elbow  Brief Clinical Note:   The patient is a 41 year old female who is now 1 year status post a an endoscopic right carpal tunnel release. Despite extensive physical therapy, medications, activity modification, etc., she continues to experience intermittent pain and paresthesias to all digits of her right hand. An EMG/NCV has confirmed the presence of "minimal" recurrent carpal tunnel syndrome. The patient presents at this time for an open right carpal tunnel release. The patient's clinical examination also was suspicious for early right cubital tunnel syndrome. The patient presents at this time for a subcutaneous anterior transposition of the ulnar nerve at the right elbow as well.  Procedure:   The patient was brought into the operating room and lain in the supine position. After adequate general laryngeal mask anesthesia was achieved, the right hand and upper extremity were prepped with ChloraPrep solution before being draped sterilely. Preoperative antibiotics were administered. A timeout was performed to verify the appropriate surgical site before the limb was exsanguinated with an Esmarch and the tourniquet inflated to 250 mmHg.   First the carpal tunnel was addressed. A longitudinal incision was made along the volar aspect of the right palm in line with the ring metacarpal. This incision was  extended proximally in a zigzag fashion over the wrist flexor crease into the distal forearm. The incision was carried down through the subcutaneous tissues with care taken to identify and protect any neurovascular structures. The distal forearm fascia was penetrated just proximal to the transverse carpal ligament. The soft tissues were released off the superficial and deep surfaces of the distal forearm fascia and this was released proximally for 3-4 cm under direct visualization. The nerve was visualized directly before a Soil scientist was passed beneath the transverse carpal ligament along the ulnar aspect of the carpal tunnel and used to release any adhesions as well as to remove any adherent synovial tissue. It also was used to protect the underlying nerve as the recurrent scar tissue and residual transverse carpal ligament was transected in line with the incision. Several adhesions along the ulnar side of the nerve were released to allow the nerve to move more freely within the carpal tunnel. Hemostasis was achieved using bipolar electrocautery.  The wound was irrigated thoroughly with sterile saline solution before being closed using 4-0 Prolene interrupted sutures. A total of 10 cc of 0.5% plain Sensorcaine was injected in and around the incision before a sterile bulky dressing was applied to the wound.   Next, the right cubital tunnel was addressed. An approximately 7-8 cm curvilinear incision was made along the course of the ulnar nerve posterior to the medial epicondyle. The incision was carried down through the subcutaneous tissues with care taken to avoid the small branches of the medial antebrachial nerve to  expose the sheath overlying the cubital tunnel. The ulnar nerve was identified at the proximal end of the tunnel and was dissected free. The nerve was then carefully followed as the roof of the cubital tunnel was released from proximal to distal. Distally, the fascia overlying the pronator  muscle was released for several centimeters. The nerve was clearly thickened just proximal to the proximal entrance of the cubital tunnel, indicating the most likely source of the nerve compression. A vessel loop was passed around the nerve and used to provide gentle traction on the nerve while circumferential dissection was carried out under loupe magnification using bipolar electrocautery and tenotomy scissors.   Once the nerve was fully mobilized, the anterior tissues were elevated as a flap just superficial to the fascia overlying the common flexor origin and a pocket created to accept the nerve. Care was taken to be sure that there was no undue tension along the nerve either proximally or distally. The nerve was carefully retracted while several 2-0 Vicryl interrupted sutures were placed to reapproximate the flap to the medial epicondylar soft tissues, thereby creating a "sling" for the ulnar nerve. The cubital tunnel itself was reapproximated using several 2-0 Vicryl interrupted sutures in order to prevent the nerve from falling back into the cubital tunnel.  The wound was copiously irrigated with sterile saline solution before the subcutaneous tissues were closed using 2-0 Vicryl interrupted sutures. The skin was closed using 3-0 Vicryl subcuticular sutures before benzoin and Steri-Strips were applied to the skin. Another 10 cc of 0.5% plain Sensorcaine was injected in and around the incision to help with postoperative analgesia. A sterile bulky dressing was applied to the elbow. The patient was placed into a volar wrist splint at this time before being awakened, extubated, and returned to the recovery room in satisfactory condition after tolerating the procedure well.

## 2021-08-22 NOTE — H&P (Signed)
History of Present Illness:  Margaret Gates is a 41 y.o. female who presents for follow-up now 10 months status post endoscopic left carpal tunnel release and nearly 1 year status post an endoscopic right carpal tunnel release. Overall, the patient still notes residual numbness and paresthesias to the her thumb and index fingers of both hands, as well as intermittent "locking" of both thumbs. She had undergone bilateral thumb CMC joint injections which provided little if any relief of her bilateral thumb "locking" symptoms. She rates her hand discomfort at 2/10 on today's visit and continues to take Tylenol and ibuprofen regularly for discomfort with limited benefit. She has completed formal occupational therapy, but continues to do exercises on her own at home. She denies any reinjury to either hand, and denies any fevers or chills. Since her last visit, she has undergone a repeat EMG of both upper extremities and presents today to review these results.  Current Outpatient Medications: acetaminophen (TYLENOL) 325 MG tablet Take 650 mg by mouth every 4 (four) hours as needed for Pain  albuterol 90 mcg/actuation inhaler INHALE 2 PUFFS BY MOUTH EVERY 4 TO 6 HOURS AS NEEDED FOR WHEEZING AND FOR SHORTNESS OF BREATH OR AS DIRECTED  dexAMETHasone (DECADRON) 0.5 MG tablet Take 0.5 tablets (0.25 mg total) by mouth at bedtime 45 tablet 3  docusate sodium (STOOL SOFTENER ORAL) Take by mouth 2 (two) times daily  fludrocortisone (FLORINEF) 0.1 mg tablet Take 1 tablet (0.1 mg total) by mouth once daily 90 tablet 3  hydrocortisone (CORTEF) 10 MG tablet Take 1.5 tablets (15 mg total) by mouth once daily 135 tablet 3  hydrocortisone (SOLU-CORTEF) 100 mg injection Inject 100 mg as directed as needed (adrenal insufficiency).  ibuprofen (MOTRIN) 200 MG tablet Take 400 mg by mouth as needed for Pain  omeprazole (PRILOSEC OTC) 20 MG EC tablet Take 1 tablet (20 mg total) by mouth once daily  syringe with needle 3 mL 25 x  5/8" Syrg Use 1 each as directed 1 each 1   Allergies:  Penicillins Unknown (childhood reaction) - Pt states that grandmother told her she was allergic to penicillin.  Tizanidine Nausea  Cyclobenzaprine Nausea   Past Medical History:  Adrenal hyperplasia (CMS-HCC)  Chickenpox   Past Surgical History:  ARTHROSCOPY HIP Right 03/23/2015  Procedure: ARTHROSCOPY, HIP, SURGICAL; WITH FEMOROPLASTY (IE, TREATMENT OF CAM LESION); Surgeon: Rosalio Macadamia, MD; Location: [place]; Service: Orthopedics; Laterality: Right;  ARTHROSCOPY HIP Right 03/23/2015  Procedure: ARTHROSCOPY, HIP, SURGICAL; WITH LABRAL REPAIR; Surgeon: Rosalio Macadamia, MD; Location: [place]; Service: Orthopedics; Laterality: Right;  INTRAOPERATIVE FLUOROSCOPY Right 03/23/2015  Procedure: FLUOROSCOPY (SEPARATE PROCEDURE), UP TO 1 HOUR PHYSICIAN OR OTHER QUALIFIED HEALTH CARE PROFESSIONAL TIME, OTHER THAN [phone] OR [id number] (EG, CARDIAC FLUOROSCOPY); Surgeon: Rosalio Macadamia, MD; Location: [place]; Service: Orthopedics; Laterality: Right;  ARTHROSCOPY HIP Left 06/20/2015  Procedure: ARTHROSCOPY, HIP, SURGICAL; WITH FEMOROPLASTY (IE, TREATMENT OF CAM LESION); Surgeon: Rosalio Macadamia, MD; Location: [place]; Service: Orthopedics; Laterality: Left;  ARTHROSCOPY HIP Left 06/20/2015  Procedure: ARTHROSCOPY, HIP, SURGICAL; WITH LABRAL REPAIR; Surgeon: Rosalio Macadamia, MD; Location: [place]; Service: Orthopedics; Laterality: Left;  INTRAOPERATIVE FLUOROSCOPY Left 06/20/2015  Procedure: FLUOROSCOPY, PHYSICIAN OR OTHER QUALIFIED HEALTH CARE PROFESSIONAL TIME MORE THAN 1 HOUR, ASSISTING A NONRADIOLOGIC PHYSICIAN OR OTHER QUALIFIED HEALTH CARE PROFESSIONAL; Surgeon: Rosalio Macadamia, MD; Location: [place]; Service: Orthopedics; Laterality: Left;  UNLISTED ARTHROSCOPY PROCEDURE Left 06/20/2015  Procedure: Complete capsular closure/iliofemoral ligament repair; Surgeon: Rosalio Macadamia, MD; Location: [place];  Service: Orthopedics; Laterality:  Left;  ESOPHAGOGASTRODOUDENOSCOPY W/BIOPSY N/A 08/29/2018  Procedure: DIAGNOSTIC ESOPHAGOGASTRODUODENOSCOPY; Surgeon: Jerelene Redden, MD; Location: [facility]; Service: Gastroenterology; Laterality: N/A;  Endoscopic right carpal tunnel release Right 08/31/2020 (Dr. Roland Rack)  Endoscopic left carpal tunnel release Left 10/26/2020 (Dr. Roland Rack)  [facility] x 2  Removal of ovarian cyst with incidental appendectomy  TUBAL LIGATION   Family History  Problem Relation Age of Onset  Scoliosis Mother  Hodgkin's lymphoma Father  ADD / ADHD Brother  Anesthesia problems Neg Hx   Social History:   Socioeconomic History:  Marital status: Married  Tobacco Use  Smoking status: Former  Packs/day: 0.50  Years: 15.00  Pack years: 7.50  Types: Cigarettes  Smokeless tobacco: Never  Vaping Use  Vaping Use: Never used  Substance and Sexual Activity  Alcohol use: No  Alcohol/week: 0.0 standard drinks  Drug use: No  Sexual activity: Yes  Partners: Male  Birth control/protection: Surgical, None   Review of Systems:  A comprehensive 14 point ROS was performed, reviewed, and the pertinent orthopaedic findings are documented in the HPI.  Physical Exam: Vitals:  08/18/21 0948  BP: 124/86  Weight: 83.6 kg (184 lb 3.2 oz)  Height: 167.6 cm ('5\' 6"'$ )  PainSc: 2  PainLoc: Wrist   General/Constitutional: The patient appears to be well-nourished, well-developed, and in no acute distress. Neuro/Psych: Normal mood and affect, oriented to person, place and time. Eyes: Non-icteric. Pupils are equal, round, and reactive to light, and exhibit synchronous movement. ENT: Unremarkable. Lymphatic: No palpable adenopathy. Respiratory: Lungs clear to auscultation, Normal chest excursion, No wheezes, and Non-labored breathing Cardiovascular: Regular rate and rhythm. No murmurs. and No edema, swelling or tenderness, except as noted in detailed exam. Integumentary: No impressive  skin lesions present, except as noted in detailed exam. Musculoskeletal: Unremarkable, except as noted in detailed exam.  Right elbow exam: Skin inspection of the right elbow is unremarkable. No swelling, erythema, ecchymosis, abrasions, or other skin abnormalities are identified. She has no elbow effusion. There is no tenderness to palpation of the medial or lateral aspects of the elbow, nor is there any tenderness to palpation anteriorly or posteriorly. She exhibits full active and passive range of motion of the elbow without pain or catching. However, she notes that if she keeps her elbow flexed, she does develop some paresthesias to her ring and little finger. She has an equivocally positive Tinel's over the cubital tunnel.  Right wrist/hand exam: On inspection, her surgical incision again is well-healed and without evidence for infection. No swelling, erythema, ecchymosis, abrasions, or other skin abnormalities are identified. She describes minimal residual tenderness to palpation over the palmar aspect of her wrist, but otherwise has no tenderness to palpation over the dorsal or volar aspects of the wrist or hand. She demonstrates full range of motion of her wrist without any discomfort. She is able to actively flex and extend all digits fully without any pain or triggering, and is able to oppose her thumb to the base of the little finger, although she feels that this motion is more uncomfortable than the similar motion on her left hand is. She experiences mild residual "pulling" over the dorsal aspect of the base of her thumb with maximal thumb flexion. She exhibits a negative grind test and a negative Finkelstein's test. She is neurovascularly intact to all digits.  EMG results:  A recent EMG of both upper extremities has been performed and is available for review. By report, the study demonstrates  evidence of minimal persistent bilateral carpal tunnel syndrome. According to the neurologist, the  patient's clinical examination also is consistent with bilateral cubital tunnel syndrome, although the nerve conduction studies do not show clear objective findings for this. This report was reviewed by myself and discussed with the patient.  Assessment: 1. Recurrent carpal tunnel syndrome, right.  2. Cubital tunnel syndrome, right.   Plan: The treatment options were discussed with the patient. In addition, patient educational materials were provided regarding the diagnosis and treatment options. The patient is quite frustrated by her continued symptoms and functional limitations, and is ready to consider more aggressive treatment options. After discussing the various pros and cons of the procedure, the patient would like to proceed with surgical intervention to include both an anterior subcutaneous transposition of the ulnar nerve at the right elbow as well as a redo open carpal tunnel release. The procedures were discussed with the patient, as were the potential risks (including bleeding, infection, nerve and/or blood vessel injury, persistent or recurrent pain/paresthesias, weakness of grip, need for further surgery, blood clots, strokes, heart attacks and/or arhythmias, pneumonia, etc.) and benefits. The patient states her understanding and wishes to proceed. All of the patient's questions and concerns were answered. She can call any time with further concerns. She will follow up post-surgery, routine. She will remain out of work at this time.   H&P reviewed and patient re-examined. No changes.

## 2021-08-22 NOTE — Discharge Instructions (Addendum)
Orthopedic discharge instructions: Keep dressings dry and intact. Keep hand elevated above heart level. May shower after dressings removed on postop day 4 (Monday). Cover sutures with Band-Aids or Ace wraps after drying off, then reapply Velcro splint to the wrist. Apply ice to affected areas frequently. Take ibuprofen 600-800 mg TID with meals for 5-7 days, then as necessary. Take ES Tylenol or pain medication as prescribed when needed.  Return for follow-up in 10-14 days or as scheduled.AMBULATORY SURGERY  DISCHARGE INSTRUCTIONS   The drugs that you were given will stay in your system until tomorrow so for the next 24 hours you should not:  Drive an automobile Make any legal decisions Drink any alcoholic beverage   You may resume regular meals tomorrow.  Today it is better to start with liquids and gradually work up to solid foods.  You may eat anything you prefer, but it is better to start with liquids, then soup and crackers, and gradually work up to solid foods.   Please notify your doctor immediately if you have any unusual bleeding, trouble breathing, redness and pain at the surgery site, drainage, fever, or pain not relieved by medication.    Additional Instructions:        Please contact your physician with any problems or Same Day Surgery at [phone], Monday through Friday 6 am to 4 pm, or [place] at [facility] number at [phone].

## 2021-08-22 NOTE — Anesthesia Preprocedure Evaluation (Addendum)
Anesthesia Evaluation  Patient identified by MRN, date of birth, ID band Patient awake  General Assessment Comment:  In PACU at first carpal tunnel anesthetic encounter, it is charted that patient had a laryngospasm requiring positive pressure and succinylcholine administration.  Reviewed: Allergy & Precautions, NPO status , Patient's Chart, lab work & pertinent test results  Airway Mallampati: I  TM Distance: >3 FB Neck ROM: Full    Dental  (+) Poor Dentition, Chipped, Missing   Pulmonary asthma , Current SmokerPatient did not abstain from smoking.,  Never hospitalized for asthma. Takes inhalers every few months.    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 Plan to provide stress dose  Past Medical History: No date: 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HCC) No date: Asthma No date: Bronchitis No date: GERD (gastroesophageal reflux disease) No date: Headache 01/26/2021: Severe sepsis (HCC) No date: Tobacco use  Past Surgical History: 08/31/2020: CARPAL TUNNEL RELEASE; Right     Comment:  Procedure: CARPAL TUNNEL RELEASE ENDOSCOPIC;  Surgeon:               Corky Mull, MD;  Location: [facility];  Service:               Orthopedics;  Laterality: Right; 10/26/2020: CARPAL TUNNEL RELEASE; Left     Comment:  Procedure: CARPAL TUNNEL RELEASE ENDOSCOPIC;  Surgeon:               Corky Mull, MD;  Location: [facility];  Service:                Orthopedics;  Laterality: Left; No date: CESAREAN SECTION No date: HIP SURGERY; Bilateral     Comment:  LAPROSCOPIC BONE SPURS     Reproductive/Obstetrics negative OB ROS                          Anesthesia Physical  Anesthesia Plan  ASA: 3  Anesthesia Plan: General   Post-op Pain Management: Tylenol PO (pre-op)*, Celebrex PO (pre-op)* and Gabapentin PO (pre-op)*   Induction: Intravenous  PONV Risk Score and Plan: 2 and Ondansetron, Dexamethasone and Midazolam  Airway Management Planned: LMA  Additional Equipment: None  Intra-op Plan:   Post-operative Plan: Extubation in OR  Informed Consent: I have reviewed the patients History and Physical, chart, labs and discussed the procedure including the risks, benefits and alternatives for the proposed anesthesia with the patient or authorized representative who has indicated his/her understanding and acceptance.     Dental advisory given  Plan Discussed with: CRNA and Surgeon  Anesthesia Plan Comments: (Discussed risks of anesthesia with patient, including PONV, sore throat, lip/dental damage. Rare risks discussed as well, such as cardiorespiratory and neurological sequelae, and allergic reactions. Patient understands. Patient counseled on benefits of smoking cessation, and increased perioperative risks associated with continued smoking. )     Anesthesia Quick Evaluation

## 2021-08-22 NOTE — Transfer of Care (Signed)
Immediate Anesthesia Transfer of Care Note  Patient: Margaret Gates  Procedure(s) Performed: OPEN REVISION RIGHT CARPAL TUNNEL RELEASE (Right: Wrist) SUBCUTANEOUS TRANSPOSITION OF THE ULNAR NERVE AT RIGHT ELBOW. (Right: Elbow)  Patient Location: PACU  Anesthesia Type:General  Level of Consciousness: awake  Airway & Oxygen Therapy: Patient Spontanous Breathing  Post-op Assessment: Report given to RN and Post -op Vital signs reviewed and stable  Post vital signs: Reviewed and stable  Last Vitals:  Vitals Value Taken Time  BP 103/78 08/22/21 1604  Temp 36.7 C 08/22/21 1604  Pulse 96 08/22/21 1606  Resp 17 08/22/21 1606  SpO2 93 % 08/22/21 1606  Vitals shown include unvalidated device data.  Last Pain:  Vitals:   08/22/21 1205  TempSrc: Temporal  PainSc: 3          Complications: No notable events documented.

## 2021-08-22 NOTE — Anesthesia Procedure Notes (Signed)
Procedure Name: LMA Insertion Date/Time: 08/22/2021 2:23 PM  Performed by: Loletha Grayer, CRNAPre-anesthesia Checklist: Patient identified, Patient being monitored, Timeout performed, Emergency Drugs available and Suction available Patient Re-evaluated:Patient Re-evaluated prior to induction Oxygen Delivery Method: Circle system utilized Preoxygenation: Pre-oxygenation with 100% oxygen Induction Type: IV induction Ventilation: Mask ventilation without difficulty LMA: LMA inserted LMA Size: 3.0 Number of attempts: 1 Placement Confirmation: positive ETCO2 Tube secured with: Tape Dental Injury: Teeth and Oropharynx as per pre-operative assessment  Comments: LMA 4 gently attempted x1-small mouth. LMA 3 x1 attempt. No issues. Atraumatic.

## 2021-08-23 ENCOUNTER — Encounter: Payer: Self-pay | Admitting: Surgery

## 2021-08-24 ENCOUNTER — Encounter: Payer: BC Managed Care – PPO | Admitting: Nurse Practitioner

## 2021-08-24 NOTE — Anesthesia Postprocedure Evaluation (Signed)
Anesthesia Post Note  Patient: Margaret Gates  Procedure(s) Performed: OPEN REVISION RIGHT CARPAL TUNNEL RELEASE (Right: Wrist) SUBCUTANEOUS TRANSPOSITION OF THE ULNAR NERVE AT RIGHT ELBOW. (Right: Elbow)  Patient location during evaluation: PACU Anesthesia Type: General Level of consciousness: awake and alert Pain management: pain level controlled Vital Signs Assessment: post-procedure vital signs reviewed and stable Respiratory status: spontaneous breathing, nonlabored ventilation and respiratory function stable Cardiovascular status: blood pressure returned to baseline and stable Postop Assessment: no apparent nausea or vomiting Anesthetic complications: no   No notable events documented.   Last Vitals:  Vitals:   08/22/21 1630 08/22/21 1635  BP: 114/81 110/75  Pulse:  97  Resp:  18  Temp: 36.6 C 36.7 C  SpO2:  98%    Last Pain:  Vitals:   08/22/21 1635  TempSrc: Temporal  PainSc: [place]

## 2021-09-18 ENCOUNTER — Ambulatory Visit (INDEPENDENT_AMBULATORY_CARE_PROVIDER_SITE_OTHER): Payer: BC Managed Care – PPO | Admitting: Nurse Practitioner

## 2021-09-18 ENCOUNTER — Other Ambulatory Visit: Payer: Self-pay

## 2021-09-18 ENCOUNTER — Other Ambulatory Visit (HOSPITAL_COMMUNITY)
Admission: RE | Admit: 2021-09-18 | Discharge: 2021-09-18 | Disposition: A | Discharge status: Home / Self Care | Payer: BC Managed Care – PPO | Source: Ambulatory Visit | Attending: Nurse Practitioner | Admitting: Nurse Practitioner

## 2021-09-18 ENCOUNTER — Encounter: Payer: Self-pay | Admitting: Nurse Practitioner

## 2021-09-18 VITALS — BP 118/72 | HR 96 | Temp 98.6°F | Resp 16 | Ht 65.0 in | Wt 185.9 lb

## 2021-09-18 DIAGNOSIS — Z124 Encounter for screening for malignant neoplasm of cervix: Secondary | ICD-10-CM | POA: Diagnosis present

## 2021-09-18 DIAGNOSIS — Z131 Encounter for screening for diabetes mellitus: Secondary | ICD-10-CM

## 2021-09-18 DIAGNOSIS — Z23 Encounter for immunization: Secondary | ICD-10-CM

## 2021-09-18 DIAGNOSIS — Z0000 Encounter for general adult medical examination without abnormal findings: Secondary | ICD-10-CM | POA: Diagnosis present

## 2021-09-18 DIAGNOSIS — E7849 Other hyperlipidemia: Secondary | ICD-10-CM

## 2021-09-18 DIAGNOSIS — D649 Anemia, unspecified: Secondary | ICD-10-CM | POA: Diagnosis not present

## 2021-09-18 DIAGNOSIS — Z1231 Encounter for screening mammogram for malignant neoplasm of breast: Secondary | ICD-10-CM

## 2021-09-18 NOTE — Progress Notes (Signed)
Name: Margaret Gates   MRN: [id number]    DOB: 1980-08-24   Date:09/18/2021       Progress Note  Subjective  Chief Complaint  Chief Complaint  Patient presents with   Annual Exam    HPI  Patient presents for annual CPE.  Diet: well balanced diet Exercise: she takes karate,  a couple hours multiple times a week, hikes and goes for walks  Sleep: 6-8 hours Last dental exam:not for awhile Last eye exam: not for [employer] Office Visit from 09/18/2021 in [facility]  AUDIT-C Score 0      Depression: Phq 9 is  negative    09/18/2021   10:24 AM 03/14/2021    8:27 AM 02/24/2021    2:37 PM  Depression screen PHQ 2/9  Decreased Interest 0 0 0  Down, Depressed, Hopeless 0 0 0  PHQ - 2 Score 0 0 0  Altered sleeping  0 0  Tired, decreased energy  0 0  Change in appetite  0 0  Feeling bad or failure about yourself   0 0  Trouble concentrating  0 0  Moving slowly or fidgety/restless  0 0  Suicidal thoughts  0 0  PHQ-9 Score  0 0  Difficult doing work/chores  Not difficult at all Not difficult at all   Hypertension: BP Readings from Last 3 Encounters:  09/18/21 118/72  08/22/21 110/75  03/14/21 116/78   Obesity: Wt Readings from Last 3 Encounters:  09/18/21 185 lb 14.4 oz (84.3 kg)  08/22/21 184 lb (83.5 kg)  03/14/21 172 lb 6.4 oz (78.2 kg)   BMI Readings from Last 3 Encounters:  09/18/21 30.94 kg/m  08/22/21 29.70 kg/m  03/14/21 28.69 kg/m     Vaccines:  HPV: up to at age 42 , ask insurance if age between 38-45  Shingrix: 42-64 yo and ask insurance if covered when patient above 9 yo Pneumonia:  educated and discussed with patient. Flu:  educated and discussed with patient.  Hep C Screening: 02/24/2021 STD testing and prevention (HIV/chl/gon/syphilis): 01/26/2021 Intimate partner violence:none Sexual History :yes Menstrual History/LMP/Abnormal Bleeding: [facility]: 08/18/2021 Incontinence Symptoms: none  Breast cancer:  - Last  Mammogram: due - BRCA gene screening: none  Osteoporosis: Discussed high calcium and vitamin D supplementation, weight bearing exercises  Cervical cancer screening: due  Skin cancer: Discussed monitoring for atypical lesions  Colorectal cancer: no concerns, does not qualify   Lung cancer:   Low Dose CT Chest recommended if Age 42-80 years, 20 pack-year currently smoking OR have quit w/in 15years. Patient does not qualify.   ECG: 01/30/2021  Advanced Care Planning: A voluntary discussion about advance care planning including the explanation and discussion of advance directives.  Discussed health care proxy and Living will, and the patient was able to identify a health care proxy as husband.  Patient does not have a living will at present time. If patient does have living will, I have requested they bring this to the clinic to be scanned in to their chart.  Lipids: Lab Results  Component Value Date   CHOL 220 (H) 02/24/2021   Lab Results  Component Value Date   HDL 50 02/24/2021   Lab Results  Component Value Date   LDLCALC 139 (H) 02/24/2021   Lab Results  Component Value Date   TRIG 174 (H) 02/24/2021   Lab Results  Component Value Date   CHOLHDL 4.4 02/24/2021   No results found for: "LDLDIRECT"  Glucose:  Glucose, Bld  Date Value Ref Range Status  02/24/2021 88 65 - 99 mg/dL Final    Comment:    .            Fasting reference interval .   01/28/2021 119 (H) 70 - 99 mg/dL Final    Comment:    Glucose reference range applies only to samples taken after fasting for at least 8 hours.  01/27/2021 121 (H) 70 - 99 mg/dL Final    Comment:    Glucose reference range applies only to samples taken after fasting for at least 8 hours.   Glucose-Capillary  Date Value Ref Range Status  01/28/2021 116 (H) 70 - 99 mg/dL Final    Comment:    Glucose reference range applies only to samples taken after fasting for at least 8 hours.  01/27/2021 110 (H) 70 - 99 mg/dL Final     Comment:    Glucose reference range applies only to samples taken after fasting for at least 8 hours.    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   Nicotine dependence, uncomplicated 50/35/4656    Past Surgical History:  Procedure Laterality Date   CARPAL TUNNEL RELEASE Right 08/31/2020   Procedure: CARPAL TUNNEL RELEASE ENDOSCOPIC;  Surgeon: Corky Mull, MD;  Location: [facility] ORS;  Service: Orthopedics;  Laterality: Right;   CARPAL TUNNEL RELEASE Left 10/26/2020   Procedure: CARPAL TUNNEL RELEASE ENDOSCOPIC;  Surgeon: Corky Mull, MD;  Location: [facility] ORS;  Service: Orthopedics;  Laterality: Left;   CARPAL TUNNEL RELEASE Right 08/22/2021   Procedure: OPEN REVISION RIGHT CARPAL TUNNEL RELEASE;  Surgeon: Corky Mull, MD;  Location: [facility] ORS;  Service: Orthopedics;  Laterality: Right;   CESAREAN SECTION     HIP SURGERY Bilateral    LAPROSCOPIC BONE SPURS   ULNAR NERVE TRANSPOSITION Right 08/22/2021   Procedure: SUBCUTANEOUS TRANSPOSITION OF THE ULNAR NERVE AT RIGHT ELBOW.;  Surgeon: Corky Mull, MD;  Location: [facility] ORS;  Service: Orthopedics;  Laterality: Right;    Family History  Problem Relation Age of Onset   Scoliosis Mother    Hodgkin's lymphoma Father    ADD / ADHD Brother     Social History   Socioeconomic History   Marital status: Married    Spouse name: Not on file   Number of children: 3   Years of education: Not on file   Highest education level: Not on file  Occupational History   Not on file   Tobacco Use   Smoking status: Some Days    Types: Cigars   Smokeless tobacco: Never  Vaping Use   Vaping Use: Some days  Substance and Sexual Activity   Alcohol use: Yes    Comment: Ocassionally   Drug use: Never   Sexual activity: Yes  Other Topics Concern   Not on file  Social History Narrative   Not on file   Social Determinants of Health   Financial Resource Strain: Low Risk  (09/18/2021)   Overall Financial Resource Strain (CARDIA)    Difficulty of Paying  Living Expenses: Not hard at all  Food Insecurity: No Food Insecurity (09/18/2021)   Hunger Vital Sign    Worried About Running Out of Food in the Last Year: Never true    Ran Out of Food in the Last Year: Never true  Transportation Needs: No Transportation Needs (09/18/2021)   PRAPARE - Hydrologist (Medical): No    Lack of Transportation (Non-Medical): No  Physical Activity: Inactive (09/18/2021)   Exercise Vital Sign    Days of Exercise per Week: 0 days    Minutes of Exercise per Session: 0 min  Stress: No Stress Concern Present (09/18/2021)   [place]    Feeling of Stress : Not at all  Social Connections: [place] (09/18/2021)   Social Connection and Isolation Panel [NHANES]    Frequency of Communication with Friends and Family: More than three times a week    Frequency of Social Gatherings with Friends and Family: More than three times a week    Attends Religious Services: More than 4 times per year    Active Member of [employer] or Organizations: Yes    Attends Archivist Meetings: 1 to 4 times per year    Marital Status: Married  Human resources officer Violence: Not At Risk (09/18/2021)   Humiliation, Afraid, Rape, and Kick questionnaire    Fear of Current or Ex-Partner: No    Emotionally Abused: No    Physically Abused: No    Sexually Abused: No     Current Outpatient Medications:    acetaminophen  (TYLENOL) 500 MG tablet, Take 500-1,000 mg by mouth every 6 (six) hours as needed for moderate pain or headache., Disp: , Rfl:    albuterol (VENTOLIN HFA) 108 (90 Base) MCG/ACT inhaler, Inhale 2 puffs into the lungs every 6 (six) hours as needed for wheezing or shortness of breath., Disp: , Rfl:    dexamethasone (DECADRON) 0.5 MG tablet, Take 0.25 mg by mouth at bedtime., Disp: , Rfl:    fludrocortisone (FLORINEF) 0.1 MG tablet, Take 0.1 mg by mouth daily., Disp: , Rfl:    hydrocortisone (CORTEF) 10 MG tablet, Take 15 mg by mouth daily., Disp: , Rfl:    ibuprofen (ADVIL) 200 MG tablet, Take 400-800 mg by mouth every 8 (eight) hours as needed for headache or moderate pain., Disp: , Rfl:    Multiple Vitamin (MULTIVITAMIN WITH MINERALS) TABS tablet, Take 1 tablet by mouth daily., Disp: , Rfl:    omeprazole (PRILOSEC OTC) 20 MG tablet, Take 20 mg by mouth daily., Disp: , Rfl:    oxyCODONE (ROXICODONE) 5 MG immediate release tablet, Take 1-2 tablets (5-10 mg total) by mouth every 4 (four) hours as needed for moderate pain or severe pain., Disp: 40 tablet, Rfl: 0  Allergies  Allergen Reactions   Penicillins     Unknown reaction (CHILDHOOD) Tolerated 1st generation cephalosporin (CEFAZOLIN) on 03/23/2015, 06/20/2015, 08/31/2020 with no documented ADRs.    Tizanidine Nausea Only   Cyclobenzaprine Nausea Only     ROS  Constitutional: Negative for fever or weight change.  Respiratory: Negative for cough and shortness of breath.   Cardiovascular: Negative for chest pain or palpitations.  Gastrointestinal: Negative for abdominal pain, no bowel changes.  Musculoskeletal: Negative for gait problem or joint swelling.  Skin: Negative for rash.  Neurological: Negative for dizziness or headache.  No other specific complaints in a complete review of systems (except as listed in HPI above).  Objective  Vitals:   09/18/21 1024  BP: 118/72  Pulse: 96  Resp: 16  Temp: 98.6 F (37 C)  TempSrc: Oral   SpO2: 97%  Weight: 185 lb 14.4 oz (84.3 kg)  Height: 5' 5"  (1.651 m)    Body mass index is 30.94 kg/m.  Physical Exam Constitutional: Patient appears well-developed and well-nourished. No distress.  HENT: Head: Normocephalic and atraumatic. Ears: B TMs ok, no erythema or effusion; Nose: Nose normal. Mouth/Throat: Oropharynx is clear and moist. No oropharyngeal exudate.  Eyes: Conjunctivae and EOM are normal. Pupils are equal, round, and reactive to light. No scleral icterus.  Neck: Normal range of motion. Neck supple. No JVD present. No thyromegaly present.  Cardiovascular: Normal rate, regular rhythm and normal heart sounds.  No murmur heard. No BLE edema. Pulmonary/Chest: Effort normal and breath sounds normal. No respiratory distress. Abdominal: Soft. Bowel sounds are normal, no distension. There is no tenderness. no masses Breast: no lumps or masses, no nipple discharge or rashes FEMALE GENITALIA:  External genitalia normal External urethra normal Vaginal vault normal without discharge or lesions Cervix normal without discharge or lesions Bimanual exam normal without masses RECTAL: no rectal masses or hemorrhoids Musculoskeletal: Normal range of motion, no joint effusions. No gross deformities Neurological: he is alert and oriented to person, place, and time. No cranial nerve deficit. Coordination, balance, strength, speech and gait are normal.  Skin: Skin is warm and dry. No rash noted. No erythema.  Psychiatric: Patient has a normal mood and affect. behavior is normal. Judgment and thought content normal.   Recent Results (from the past 2160 hour(s))  Pregnancy, urine POC     Status: None   Collection Time: 08/22/21 12:11 PM  Result Value Ref Range   Preg Test, Ur NEGATIVE NEGATIVE    Comment:        THE SENSITIVITY OF THIS METHODOLOGY IS >24 mIU/mL      Fall Risk:    09/18/2021   10:24 AM 03/14/2021    8:26 AM 02/24/2021    2:36 PM  Fall Risk   Falls in the past  year? 0 0 0  Number falls in past yr: 0 0 0  Injury with Fall? 0 0 0  Risk for fall due to :  No Fall Risks No Fall Risks  Follow up Falls evaluation completed Falls prevention discussed Falls prevention discussed     Functional Status Survey: Is the patient deaf or have difficulty hearing?: No Does the patient have difficulty seeing, even when wearing glasses/contacts?: No Does the patient have difficulty concentrating, remembering, or making decisions?: No Does the patient have difficulty walking or climbing stairs?: No Does the patient have difficulty dressing or bathing?: No Does the patient have difficulty doing errands alone such as visiting a doctor's office or shopping?: No   Assessment & Plan  1. Annual physical exam  - MM Digital Screening; Future - Lipid panel - COMPLETE METABOLIC PANEL WITH GFR - CBC with Differential/Platelet - Hemoglobin A1c - Cytology - PAP  2. Need for influenza vaccination  - Flu Vaccine QUAD 6+ mos PF IM (Fluarix Quad PF)  3. Other hyperlipidemia  - Lipid panel  4. Anemia, unspecified type  - CBC with Differential/Platelet  5. Screening for diabetes mellitus  - COMPLETE METABOLIC PANEL WITH GFR - Hemoglobin A1c  6. Screening for cervical cancer  - Cytology - PAP  7. Encounter for screening mammogram for malignant neoplasm of breast  - MM Digital Screening; Future   -  USPSTF grade A and B recommendations reviewed with patient; age-appropriate recommendations, preventive care, screening tests, etc discussed and encouraged; healthy living encouraged; see AVS for patient education given to patient -Discussed importance of 150 minutes of physical activity weekly, eat two servings of fish weekly, eat one serving of tree nuts ( cashews, pistachios, pecans, almonds.Marland Kitchen) every other day, eat 6 servings of fruit/vegetables daily and drink plenty of water and avoid sweet beverages.

## 2021-09-19 ENCOUNTER — Ambulatory Visit: Payer: BC Managed Care – PPO | Attending: Surgery | Admitting: Occupational Therapy

## 2021-09-19 ENCOUNTER — Encounter: Payer: Self-pay | Admitting: Occupational Therapy

## 2021-09-19 DIAGNOSIS — M6281 Muscle weakness (generalized): Secondary | ICD-10-CM | POA: Diagnosis present

## 2021-09-19 DIAGNOSIS — M25631 Stiffness of right wrist, not elsewhere classified: Secondary | ICD-10-CM | POA: Diagnosis present

## 2021-09-19 DIAGNOSIS — M25531 Pain in right wrist: Secondary | ICD-10-CM | POA: Diagnosis present

## 2021-09-19 DIAGNOSIS — M79641 Pain in right hand: Secondary | ICD-10-CM | POA: Diagnosis present

## 2021-09-19 DIAGNOSIS — M25521 Pain in right elbow: Secondary | ICD-10-CM | POA: Diagnosis present

## 2021-09-19 DIAGNOSIS — M25621 Stiffness of right elbow, not elsewhere classified: Secondary | ICD-10-CM

## 2021-09-19 DIAGNOSIS — L905 Scar conditions and fibrosis of skin: Secondary | ICD-10-CM

## 2021-09-19 LAB — COMPLETE METABOLIC PANEL WITH GFR
AG Ratio: 1.9 (calc) (ref 1.0–2.5)
ALT: 13 U/L (ref 6–29)
AST: 9 U/L — ABNORMAL LOW (ref 10–30)
Albumin: 4.7 g/dL (ref 3.6–5.1)
Alkaline phosphatase (APISO): 65 U/L (ref 31–125)
BUN: 11 mg/dL (ref 7–25)
CO2: 28 mmol/L (ref 20–32)
Calcium: 9.7 mg/dL (ref 8.6–10.2)
Chloride: 103 mmol/L (ref 98–110)
Creat: 0.77 mg/dL (ref 0.50–0.99)
Globulin: 2.5 g/dL (calc) (ref 1.9–3.7)
Glucose, Bld: 72 mg/dL (ref 65–99)
Potassium: 4.2 mmol/L (ref 3.5–5.3)
Sodium: 137 mmol/L (ref 135–146)
Total Bilirubin: 0.5 mg/dL (ref 0.2–1.2)
Total Protein: 7.2 g/dL (ref 6.1–8.1)
eGFR: 99 mL/min/{1.73_m2} (ref >=60)

## 2021-09-19 LAB — CBC WITH DIFFERENTIAL/PLATELET
Absolute Monocytes: 697 cells/uL (ref 200–950)
Basophils Absolute: 60 cells/uL (ref 0–200)
Basophils Relative: 0.7 %
Eosinophils Absolute: 238 cells/uL (ref 15–500)
Eosinophils Relative: 2.8 %
HCT: 41.6 % (ref 35.0–45.0)
Hemoglobin: 14.3 g/dL (ref 11.7–15.5)
Lymphs Abs: 2151 cells/uL (ref 850–3900)
MCH: 34.7 pg — ABNORMAL HIGH (ref 27.0–33.0)
MCHC: 34.4 g/dL (ref 32.0–36.0)
MCV: 101 fL — ABNORMAL HIGH (ref 80.0–100.0)
MPV: 10 fL (ref 7.5–12.5)
Monocytes Relative: 8.2 %
Neutro Abs: 5355 cells/uL (ref 1500–7800)
Neutrophils Relative %: 63 %
Platelets: 328 10*3/uL (ref 140–400)
RBC: 4.12 10*6/uL (ref 3.80–5.10)
RDW: 11.5 % (ref 11.0–15.0)
Total Lymphocyte: 25.3 %
WBC: 8.5 10*3/uL (ref 3.8–10.8)

## 2021-09-19 LAB — LIPID PANEL
Cholesterol: 237 mg/dL — ABNORMAL HIGH (ref <200)
HDL: 55 mg/dL (ref >=50)
LDL Cholesterol (Calc): 157 mg/dL (calc) — ABNORMAL HIGH
Non-HDL Cholesterol (Calc): 182 mg/dL (calc) — ABNORMAL HIGH (ref <130)
Total CHOL/HDL Ratio: 4.3 (calc) (ref <5.0)
Triglycerides: 128 mg/dL (ref <150)

## 2021-09-19 LAB — HEMOGLOBIN A1C
Hgb A1c MFr Bld: 4.8 % of total Hgb (ref <5.7)
Mean Plasma Glucose: 91 mg/dL
eAG (mmol/L): 5 mmol/L

## 2021-09-19 NOTE — Therapy (Signed)
[facility] [address] [place], [address] Phone: [phone]   Fax:  [phone]  Occupational Therapy Evaluation  Patient Details  Name: Margaret Gates MRN: [id number] Date of Birth: 05-28-1980 Referring Provider (OT): Poggi/Mchgee   Encounter Date: 09/19/2021   OT End of Session - 09/19/21 1129     Visit Number 1    Number of Visits 16    Date for OT Re-Evaluation 11/14/21    OT Start Time 0955    OT Stop Time 1100    OT Time Calculation (min) 65 min    Activity Tolerance Patient tolerated treatment well    Behavior During Therapy [facility] for tasks assessed/performed             Past Medical History:  Diagnosis Date   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[place])    Asthma    Bronchitis    GERD (gastroesophageal reflux disease)    Headache    Severe sepsis ([place]) 01/26/2021   Tobacco use     Past Surgical History:  Procedure Laterality Date   CARPAL TUNNEL RELEASE Right 08/31/2020   Procedure: CARPAL TUNNEL RELEASE ENDOSCOPIC;  Surgeon: Corky Mull, MD;  Location: [facility] ORS;  Service: Orthopedics;  Laterality: Right;   CARPAL TUNNEL RELEASE Left 10/26/2020   Procedure: CARPAL TUNNEL RELEASE ENDOSCOPIC;  Surgeon: Corky Mull, MD;  Location: [facility] ORS;  Service: Orthopedics;  Laterality: Left;   CARPAL TUNNEL RELEASE Right 08/22/2021   Procedure: OPEN REVISION RIGHT CARPAL TUNNEL RELEASE;  Surgeon: Corky Mull, MD;  Location: [facility] ORS;  Service: Orthopedics;  Laterality: Right;   CESAREAN SECTION     HIP SURGERY Bilateral    LAPROSCOPIC BONE SPURS   ULNAR NERVE TRANSPOSITION Right 08/22/2021   Procedure: SUBCUTANEOUS TRANSPOSITION OF THE ULNAR NERVE AT RIGHT ELBOW.;  Surgeon: Corky Mull, MD;  Location: [facility] ORS;  Service: Orthopedics;  Laterality: Right;    There were no vitals filed for this visit.   Subjective Assessment - 09/19/21 1122     Subjective  I am still taking ibuprofen and Tylenol  alternating and then about 2 times a day I take oxycodone.  My elbow still feels weird and numb.  Some funny feeling in my pinky.  And by the end of the day my pain can increase to a 6-8/10 in the elbow and the wrist.  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  The elbow just feels tight in the wrist I cannot bend back-and-forth.  And no strength.    Pertinent History Pt follow up with ortho on 09/01/21  for her first postop appointment following a open right carpal tunnel release in addition to subcutaneous anterior transposition of the ulnar nerve at the right elbow. Surgery was performed by Dr. Roland Rack on 08/22/2021. Overall the patient feels that she is doing well, she does report moderate swelling in the right hand however this does improve when she is able to elevate the hand and apply ice. She states that her numbness and tingling has improved to the right upper extremity after surgery. She denies any falls or trauma affecting the right upper extremity since surgery, she denies any signs of infection at home such as fevers or chills. The patient has been out of work since her date of surgery. The patient is taking occasional oxycodone in addition to ibuprofen and Tylenol. The patient denies any catching or locking symptoms in either the right wrist or elbow at this  time. 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    Currently in Pain? Yes    Pain Score 6    Increase to 8/10 at the end of the day   Pain Location Arm    Pain Orientation Right    Pain Descriptors / Indicators Aching;Tender;Tightness;Pins and needles;Sore    Pain Type Surgical pain    Pain Onset 1 to 4 weeks ago               [facility] OT Assessment - 09/19/21 0001       Assessment   Medical Diagnosis Right cubital tunnel release and open  CTR    Referring Provider (OT) Poggi/Mchgee    Onset Date/Surgical Date 08/22/21     Hand Dominance Right    Prior Therapy Earlier this year for bilateral carpal tunnel release      Home  Environment   Lives With Family      Prior Function   Vocation Full time employment    Leisure Before previous surgery reported [employer].  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      AROM   Right Elbow Flexion 135    Right Elbow Extension -14    Right Wrist Extension 20 Degrees    Right Wrist Flexion 50 Degrees    Right Wrist Radial Deviation 18 Degrees    Right Wrist Ulnar Deviation 32 Degrees      Right Hand AROM   R Thumb Radial ABduction/ADduction 0-55 48    R Thumb Palmar ABduction/ADduction 0-45 60    R Thumb Opposition to Index --   Opposition to base of fifth   R Index  MCP 0-90 85 Degrees    R Index PIP 0-100 100 Degrees    R Long  MCP 0-90 85 Degrees    R Long PIP 0-100 100 Degrees    R Ring  MCP 0-90 80 Degrees    R Ring PIP 0-100 100 Degrees    R Little  MCP 0-90 80 Degrees    R Little PIP 0-100 100 Degrees                      OT Treatments/Exercises (OP) - 09/19/21 0001       RUE Contrast Bath   Time 8 minutes    Comments prior to massage , ROM R elbow and hand              Reviewed with patient home program on desensitization several times during the day soft massage as well as soft textures and when washing hands and showering dry with towel. Cica -Care scar pad is provided for nighttime use under Tubigrip. Contrast to be done or heat prior to home exercises and ice after forts. Patient educated and reviewed scar massage. Active range of motion for endrange elbow flexion extension with thumbs up and palm up.  10 reps 2 times a day Followed by ulnar nerve glide 5 reps.  Pain-free  Gentle passive range of motion or prayer stretch for wrist extension 10 reps Wrist flexion gentle passive stretch 10 reps Active assisted range of motion radial ulnar deviation and active range of motion supination pronation Followed by 10  reps of tendon glides and opposition.     Home exercises can be done 2-3  times a day     OT Education - 09/19/21 1129     Education Details Findings of evaluation and home program    Person(s) Educated Patient    Methods Explanation;Demonstration;Tactile cues;Verbal cues;Handout    Comprehension Verbal cues required;Returned demonstration;Verbalized understanding              OT Short Term Goals - 09/19/21 1135       OT SHORT TERM GOAL #1   Title Patient to be independent in home program to decrease scar tissue and pain with increased motion and wrist and elbow flexion extension.    Baseline Patient reports pain 6-8/10 increasing with use and by the end of the day.  Scar tissue still thick and adhere.  As well as hypersensitivity.  Decreased flexion extension at wrist and elbow.    Time 3    Period Weeks    Status New    Target Date 10/10/21               OT Long Term Goals - 09/19/21 1136       OT LONG TERM GOAL #1   Title Hypersensitivity over both scars improved for patient to tolerate different textures, clothing and using tools without increase symptoms.    Baseline Patient sensitive to soft massage, scar massage and soft textures.  Patient to initiate that as well as a rougher textures like a towel for drying.    Time 4    Period Weeks    Status New    Target Date 10/17/21      OT LONG TERM GOAL #2   Title Right elbow and wrist active range of motion increased to within normal limits for patient to be able to don and doff overhead sweater and bra with no increase symptoms.    Baseline Discomfort and pain increase 6-8/10 with use of right arm per patient.  Right elbow extension -14, flexion 135 with discomfort.  Right wrist extension 20 and flexion 50.    Time 4    Period Weeks    Status New    Target Date 10/17/21      OT LONG TERM GOAL #3   Title Right elbow and wrist strength improved for patient to be able to push and pull door as well as turn  doorknob carrying left pots and pains while cooking with no increase symptoms    Baseline Patient 4 weeks postop no strengthening yet.  Patient not using right arm and any resisted pulling and pushing or lifting    Time 8    Period Weeks    Status New    Target Date 11/14/21                   Plan - 09/19/21 1131     Clinical Impression Statement Patient presented OT evaluation with a diagnosis of right dominant hand cubital tunnel release as well as open carpal tunnel release on 08/22/2021.  Patient is 4 weeks postop.  Patient with thick scar tissue as well as hypersensitivity-(hard time tolerate soft massage, scar massage and soft textures) and scar adhesion at carpal tunnel more than elbow limiting her range of motion for mostly wrist extension of flexion as well as endrange elbow flexion extension.  Patient do report pain 6-8/10 with increased use or at the end of the day.  Patient with some sensory changes at elbow as well as fifth digit.  Patient can benefit from skilled OT services for decrease edema and pain  and increased motion and strength in right dominant arm to return to prior level of function..    OT Occupational Profile and History Problem Focused Assessment - Including review of records relating to presenting problem    Occupational performance deficits (Please refer to evaluation for details): ADL's;IADL's;Rest and Sleep;Work;Play;Leisure;Social Participation    Body Structure / Function / Physical Skills ADL;Strength;Pain;UE functional use;ROM;IADL;Scar mobility;Sensation;Decreased knowledge of precautions;Edema    Rehab Potential Good    Clinical Decision Making Limited treatment options, no task modification necessary    Comorbidities Affecting Occupational Performance: May have comorbidities impacting occupational performance   Patient had earlier this year bilateral carpal tunnel releases   Modification or Assistance to Complete Evaluation  No modification of tasks or  assist necessary to complete eval    OT Frequency 2x / week    OT Duration 8 weeks    OT Treatment/Interventions Self-care/ADL training;Paraffin;Moist Heat;Fluidtherapy;Contrast Bath;Therapeutic exercise;Manual Therapy;Patient/family education;Passive range of motion;Scar mobilization;DME and/or AE instruction    Consulted and Agree with Plan of Care Patient             Patient will benefit from skilled therapeutic intervention in order to improve the following deficits and impairments:   Body Structure / Function / Physical Skills: ADL, Strength, Pain, UE functional use, ROM, IADL, Scar mobility, Sensation, Decreased knowledge of precautions, Edema       Visit Diagnosis: Pain in right wrist  Pain in right hand  Stiffness of right wrist, not elsewhere classified  Muscle weakness (generalized)  Scar condition and fibrosis of skin  Pain in right elbow  Stiffness of right elbow, not elsewhere classified    Problem List Patient Active Problem List   Diagnosis Date Noted   Cubital tunnel syndrome, right 08/18/2021   Bilateral thumb pain 04/03/2021   Adrenal insufficiency ([place]) 01/26/2021   Asthma 01/26/2021   GERD (gastroesophageal reflux disease) 01/26/2021   Tobacco use 01/26/2021   CAP (community acquired pneumonia) 01/26/2021   Carpal tunnel syndrome, right 08/19/2020   Tear of left acetabular labrum 05/06/2015   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, [place], [address] Phone: [phone]   Fax:  [phone]  Name: Kieley Akter MRN: [id number] Date of Birth: 1980-10-15

## 2021-09-20 LAB — CYTOLOGY - PAP
Chlamydia: NEGATIVE
Comment: NEGATIVE
Comment: NEGATIVE
Comment: NORMAL
Diagnosis: NEGATIVE
High risk HPV: NEGATIVE
Neisseria Gonorrhea: NEGATIVE

## 2021-09-21 ENCOUNTER — Ambulatory Visit: Payer: BC Managed Care – PPO | Admitting: Occupational Therapy

## 2021-09-25 ENCOUNTER — Ambulatory Visit: Payer: BC Managed Care – PPO | Admitting: Occupational Therapy

## 2021-09-25 DIAGNOSIS — L905 Scar conditions and fibrosis of skin: Secondary | ICD-10-CM

## 2021-09-25 DIAGNOSIS — M25621 Stiffness of right elbow, not elsewhere classified: Secondary | ICD-10-CM

## 2021-09-25 DIAGNOSIS — M25631 Stiffness of right wrist, not elsewhere classified: Secondary | ICD-10-CM

## 2021-09-25 DIAGNOSIS — M79641 Pain in right hand: Secondary | ICD-10-CM

## 2021-09-25 DIAGNOSIS — M25521 Pain in right elbow: Secondary | ICD-10-CM

## 2021-09-25 DIAGNOSIS — M6281 Muscle weakness (generalized): Secondary | ICD-10-CM

## 2021-09-25 DIAGNOSIS — M25531 Pain in right wrist: Secondary | ICD-10-CM

## 2021-09-27 ENCOUNTER — Encounter: Payer: Self-pay | Admitting: Occupational Therapy

## 2021-09-27 NOTE — Therapy (Signed)
[facility] [address], [place], [address] Phone: [phone]   Fax:  [phone]  Occupational Therapy Treatment  Patient Details  Name: Margaret Gates MRN: [id number] Date of Birth: [birth year] Referring Provider (OT): Poggi/Mchgee   Encounter Date: 09/25/2021   OT End of Session - 09/27/21 2157     Visit Number 2    Number of Visits 16    Date for OT Re-Evaluation 11/14/21    OT Start Time 1134    OT Stop Time 1220    OT Time Calculation (min) 46 min    Activity Tolerance Patient tolerated treatment well    Behavior During Therapy [facility] for tasks assessed/performed             Past Medical History:  Diagnosis Date   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[place])    Asthma    Bronchitis    GERD (gastroesophageal reflux disease)    Headache    Severe sepsis ([place]) 01/26/2021   Tobacco use     Past Surgical History:  Procedure Laterality Date   CARPAL TUNNEL RELEASE Right 08/31/2020   Procedure: CARPAL TUNNEL RELEASE ENDOSCOPIC;  Surgeon: Corky Mull, MD;  Location: [facility] ORS;  Service: Orthopedics;  Laterality: Right;   CARPAL TUNNEL RELEASE Left 10/26/2020   Procedure: CARPAL TUNNEL RELEASE ENDOSCOPIC;  Surgeon: Corky Mull, MD;  Location: [facility] ORS;  Service: Orthopedics;  Laterality: Left;   CARPAL TUNNEL RELEASE Right 08/22/2021   Procedure: OPEN REVISION RIGHT CARPAL TUNNEL RELEASE;  Surgeon: Corky Mull, MD;  Location: [facility] ORS;  Service: Orthopedics;  Laterality: Right;   CESAREAN SECTION     HIP SURGERY Bilateral    LAPROSCOPIC BONE SPURS   ULNAR NERVE TRANSPOSITION Right 08/22/2021   Procedure: SUBCUTANEOUS TRANSPOSITION OF THE ULNAR NERVE AT RIGHT ELBOW.;  Surgeon: Corky Mull, MD;  Location: [facility] ORS;  Service: Orthopedics;  Laterality: Right;    There were no vitals filed for this visit.   Subjective Assessment - 09/27/21 2154     Subjective  Pt reports she will have to have another  surgery on left carpal tunnel and release at elbow. Has been doing exercises in the shower with the heat, then doing ice.  Pt reports she Missed her appt last week with a migraine.  She reports she was not able to return to her job yet at [employer] since she has to be able to lift 50#.  She is currently working at a school in the afternoons but does not have to perform any lifting.    Pertinent History Pt follow up with ortho on 09/01/21  for her first postop appointment following a open right carpal tunnel release in addition to subcutaneous anterior transposition of the ulnar nerve at the right elbow. Surgery was performed by Dr. Roland Rack on 08/22/2021. Overall the patient feels that she is doing well, she does report moderate swelling in the right hand however this does improve when she is able to elevate the hand and apply ice. She states that her numbness and tingling has improved to the right upper extremity after surgery. She denies any falls or trauma affecting the right upper extremity since surgery, she denies any signs of infection at home such as fevers or chills. The patient has been out of work since her date of surgery. The patient is taking occasional oxycodone in addition to ibuprofen and Tylenol. The patient denies any catching or locking symptoms in either the right  wrist or elbow at this time. 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    Currently in Pain? Yes    Pain Score 7     Pain Location Arm    Pain Orientation Right    Pain Descriptors / Indicators Aching;Tightness;Tender    Pain Type Surgical pain    Pain Onset 1 to 4 weeks ago    Pain Frequency Intermittent             Contrast performed to right elbow and wrist for 9 mins to decrease edema, decrease pain and increase motion.    Following contrast, pt seen for manual therapy for emphasis on scar massage both  elbow and wrist.  Pt has some tenderness and hypersensitivity along scar line.  She is attempting to perform desensitization techniques over scar.  Utilized a small piece of silicone scar pad over scar to decrease sensitivity and improve toleration of scar massage.    Therapeutic Exercise:   Pt performing tendon glides with therapist demo and cues for proper form and technique Ulnar nerve glide for 5 reps Prayer stretch light ROM at wrist  Active ROM of digits, wrist, hand and elbow Active range of motion for endrange elbow flexion extension with thumbs up and palm up.  10 reps 2 times a day Wrist flexion gentle passive stretch 10 reps Active assisted range of motion radial ulnar deviation and active range of motion supination pronation Elbow extension -10 degrees this date.   Reported some difficulty with tubigrip and arm was uncomfortable after use in wrist.  Issued one longer piece of tubigrip F to try.    Home exercises can be done 2-3 times a day                     OT Education - 09/27/21 2157     Education Details home exercise program, positioning    Person(s) Educated Patient    Methods Explanation;Demonstration;Tactile cues;Verbal cues;Handout    Comprehension Verbal cues required;Returned demonstration;Verbalized understanding              OT Short Term Goals - 09/19/21 1135       OT SHORT TERM GOAL #1   Title Patient to be independent in home program to decrease scar tissue and pain with increased motion and wrist and elbow flexion extension.    Baseline Patient reports pain 6-8/10 increasing with use and by the end of the day.  Scar tissue still thick and adhere.  As well as hypersensitivity.  Decreased flexion extension at wrist and elbow.    Time 3    Period Weeks    Status New    Target Date 10/10/21               OT Long Term Goals - 09/19/21 1136       OT LONG TERM GOAL #1   Title Hypersensitivity over both scars improved for  patient to tolerate different textures, clothing and using tools without increase symptoms.    Baseline Patient sensitive to soft massage, scar massage and soft textures.  Patient to initiate that as well as a rougher textures like a towel for drying.    Time 4    Period Weeks    Status New    Target Date 10/17/21  OT LONG TERM GOAL #2   Title Right elbow and wrist active range of motion increased to within normal limits for patient to be able to don and doff overhead sweater and bra with no increase symptoms.    Baseline Discomfort and pain increase 6-8/10 with use of right arm per patient.  Right elbow extension -14, flexion 135 with discomfort.  Right wrist extension 20 and flexion 50.    Time 4    Period Weeks    Status New    Target Date 10/17/21      OT LONG TERM GOAL #3   Title Right elbow and wrist strength improved for patient to be able to push and pull door as well as turn doorknob carrying left pots and pains while cooking with no increase symptoms    Baseline Patient 4 weeks postop no strengthening yet.  Patient not using right arm and any resisted pulling and pushing or lifting    Time 8    Period Weeks    Status New    Target Date 11/14/21                   Plan - 09/27/21 2158     Clinical Impression Statement Patient presented OT evaluation with a diagnosis of right dominant hand cubital tunnel release as well as open carpal tunnel release on 08/22/2021.  Patient is 4 weeks postop.  Patient with thick scar tissue as well as hypersensitivity-(hard time tolerate soft massage, scar massage and soft textures) and scar adhesion at carpal tunnel more than elbow limiting her range of motion for mostly wrist extension of flexion as well as endrange elbow flexion extension.  Patient report pain 6-8/10 with increased use or at the end of the day.  Patient with some sensory changes at elbow as well as fifth digit. Pt continues to report increased pain in right elbow and  hand, reponding well to contrast for edema control.  Reported some pain in wrist after wearing tubigrip, switched to one longer piece of tubigrip to assist with edema control, size F with lighter compression at wrist surgical site.  Pt able to demonstrate scar massage techniques but has some hypersensitivity at scar sites. Patient can benefit from skilled OT services for decrease edema and pain and increased motion and strength in right dominant arm to return to prior level of function..    OT Occupational Profile and History Problem Focused Assessment - Including review of records relating to presenting problem    Occupational performance deficits (Please refer to evaluation for details): ADL's;IADL's;Rest and Sleep;Work;Play;Leisure;Social Participation    Body Structure / Function / Physical Skills ADL;Strength;Pain;UE functional use;ROM;IADL;Scar mobility;Sensation;Decreased knowledge of precautions;Edema    Rehab Potential Good    Clinical Decision Making Limited treatment options, no task modification necessary    Comorbidities Affecting Occupational Performance: May have comorbidities impacting occupational performance   Patient had earlier this year bilateral carpal tunnel releases   Modification or Assistance to Complete Evaluation  No modification of tasks or assist necessary to complete eval    OT Frequency 2x / week    OT Duration 8 weeks    OT Treatment/Interventions Self-care/ADL training;Paraffin;Moist Heat;Fluidtherapy;Contrast Bath;Therapeutic exercise;Manual Therapy;Patient/family education;Passive range of motion;Scar mobilization;DME and/or AE instruction    Consulted and Agree with Plan of Care Patient             Patient will benefit from skilled therapeutic intervention in order to improve the following deficits and impairments:   Body Structure /  Function / Physical Skills: ADL, Strength, Pain, UE functional use, ROM, IADL, Scar mobility, Sensation, Decreased knowledge of  precautions, Edema       Visit Diagnosis: Pain in right wrist  Pain in right hand  Stiffness of right wrist, not elsewhere classified  Muscle weakness (generalized)  Scar condition and fibrosis of skin  Pain in right elbow  Stiffness of right elbow, not elsewhere classified    Problem List Patient Active Problem List   Diagnosis Date Noted   Cubital tunnel syndrome, right 08/18/2021   Bilateral thumb pain 04/03/2021   Adrenal 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, [place], [address] Phone: [phone]   Fax:  [phone]  Name: Ceonna Frazzini MRN: [id number] Date of Birth: 28-Oct-1980

## 2021-09-28 ENCOUNTER — Ambulatory Visit: Payer: BC Managed Care – PPO | Admitting: Occupational Therapy

## 2021-09-28 DIAGNOSIS — L905 Scar conditions and fibrosis of skin: Secondary | ICD-10-CM

## 2021-09-28 DIAGNOSIS — M25621 Stiffness of right elbow, not elsewhere classified: Secondary | ICD-10-CM

## 2021-09-28 DIAGNOSIS — M6281 Muscle weakness (generalized): Secondary | ICD-10-CM

## 2021-09-28 DIAGNOSIS — M25631 Stiffness of right wrist, not elsewhere classified: Secondary | ICD-10-CM

## 2021-09-28 DIAGNOSIS — M25531 Pain in right wrist: Secondary | ICD-10-CM

## 2021-09-28 DIAGNOSIS — M79641 Pain in right hand: Secondary | ICD-10-CM

## 2021-09-28 DIAGNOSIS — M25521 Pain in right elbow: Secondary | ICD-10-CM

## 2021-09-28 NOTE — Therapy (Signed)
[facility] [address], [place], [address] Phone: [phone]   Fax:  [phone]  Occupational Therapy Treatment  Patient Details  Name: Margaret Gates MRN: [id number] Date of Birth: [birth year] Referring Provider (OT): Poggi/Mchgee   Encounter Date: 09/28/2021   OT End of Session - 09/28/21 1058     Visit Number 3    Number of Visits 16    Date for OT Re-Evaluation 11/14/21    OT Start Time 0949    OT Stop Time 1040    OT Time Calculation (min) 51 min    Activity Tolerance Patient tolerated treatment well    Behavior During Therapy [facility] for tasks assessed/performed             Past Medical History:  Diagnosis Date   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[place])    Asthma    Bronchitis    GERD (gastroesophageal reflux disease)    Headache    Severe sepsis ([place]) 01/26/2021   Tobacco use     Past Surgical History:  Procedure Laterality Date   CARPAL TUNNEL RELEASE Right 08/31/2020   Procedure: CARPAL TUNNEL RELEASE ENDOSCOPIC;  Surgeon: Corky Mull, MD;  Location: [facility];  Service: Orthopedics;  Laterality: Right;   CARPAL TUNNEL RELEASE Left 10/26/2020   Procedure: CARPAL TUNNEL RELEASE ENDOSCOPIC;  Surgeon: Corky Mull, MD;  Location: [facility];  Service: Orthopedics;  Laterality: Left;   CARPAL TUNNEL RELEASE Right 08/22/2021   Procedure: OPEN REVISION RIGHT CARPAL TUNNEL RELEASE;  Surgeon: Corky Mull, MD;  Location: [facility];  Service: Orthopedics;  Laterality: Right;   CESAREAN SECTION     HIP SURGERY Bilateral    LAPROSCOPIC BONE SPURS   ULNAR NERVE TRANSPOSITION Right 08/22/2021   Procedure: SUBCUTANEOUS TRANSPOSITION OF THE ULNAR NERVE AT RIGHT ELBOW.;  Surgeon: Corky Mull, MD;  Location: [facility];  Service: Orthopedics;  Laterality: Right;    There were no vitals filed for this visit.   Subjective Assessment - 09/28/21 1055     Subjective  Doing okay - but elbow sore and sometimes  shooting pain. Scar at my palm tight and tender - that one area where I thought it opend when I had stitches . More motion in my wrist    Pertinent History Pt follow up with ortho on 09/01/21  for her first postop appointment following a open right carpal tunnel release in addition to subcutaneous anterior transposition of the ulnar nerve at the right elbow. Surgery was performed by Dr. Roland Rack on 08/22/2021. Overall the patient feels that she is doing well, she does report moderate swelling in the right hand however this does improve when she is able to elevate the hand and apply ice. She states that her numbness and tingling has improved to the right upper extremity after surgery. She denies any falls or trauma affecting the right upper extremity since surgery, she denies any signs of infection at home such as fevers or chills. The patient has been out of work since her date of surgery. The patient is taking occasional oxycodone in addition to ibuprofen and Tylenol. The patient denies any catching or locking symptoms in either the right wrist or elbow at this time. 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    Currently in Pain? Yes  Pain Score 5     Pain Location Arm    Pain Orientation Right    Pain Descriptors / Indicators Tender;Sore;Shooting    Pain Type Surgical pain    Pain Onset More than a month ago    Pain Frequency Intermittent                [facility] OT Assessment - 09/28/21 0001       AROM   Right Wrist Extension 50 Degrees    Right Wrist Flexion 75 Degrees               add some padding for cubital tunnel to donn - when sitting or need cushioning to decrease tenderness and pain        OT Treatments/Exercises (OP) - 09/28/21 0001       RUE Paraffin   Number Minutes Paraffin 8 Minutes    RUE Paraffin Location Hand    Comments prior to scar massage  and motion      RUE Contrast Bath   Time 8 minutes    Comments elbow prior to ROM             Contrast performed to right elbow decrease edema, decrease pain and increase motion - full extention.   Initiated paraffin to wrist and hand for scar mobs - ice end after manual    manual therapy for emphasis on scar massage both elbow and wrist.  Most focus at wrist. Pt has some tenderness and hypersensitivity along scar line.  She is attempting to perform desensitization techniques over scar.  Utilized a small piece of silicone scar pad over scar to decrease sensitivity and improve toleration of scar massage- manually , mini massager and xtractor 3 x at distal scar with extention of digits      Therapeutic Exercise:   Pt performing tendon glides with therapist demo and cues for proper form and technique Ulnar nerve glide for 5 reps only  Prayer stretch light ROM at wrist  extention - and wrist flexion    Active range of motion for endrange elbow flexion /extension with thumbs up and palm up.  10 reps 2 times a day  Add med N glide -after AAROM for wrist flexion, extention - but only 5 reps  2 x day     Home exercises can be done 2-3 times a day         OT Education - 09/28/21 1058     Education Details home exercise program, positioning    Person(s) Educated Patient    Methods Explanation;Demonstration;Tactile cues;Verbal cues;Handout    Comprehension Verbal cues required;Returned demonstration;Verbalized understanding              OT Short Term Goals - 09/19/21 1135       OT SHORT TERM GOAL #1   Title Patient to be independent in home program to decrease scar tissue and pain with increased motion and wrist and elbow flexion extension.    Baseline Patient reports pain 6-8/10 increasing with use and by the end of the day.  Scar tissue still thick and adhere.  As well as hypersensitivity.  Decreased flexion extension at wrist and elbow.    Time 3    Period Weeks     Status New    Target Date 10/10/21               OT Long Term Goals - 09/19/21 1136       OT LONG TERM GOAL #1  Title Hypersensitivity over both scars improved for patient to tolerate different textures, clothing and using tools without increase symptoms.    Baseline Patient sensitive to soft massage, scar massage and soft textures.  Patient to initiate that as well as a rougher textures like a towel for drying.    Time 4    Period Weeks    Status New    Target Date 10/17/21      OT LONG TERM GOAL #2   Title Right elbow and wrist active range of motion increased to within normal limits for patient to be able to don and doff overhead sweater and bra with no increase symptoms.    Baseline Discomfort and pain increase 6-8/10 with use of right arm per patient.  Right elbow extension -14, flexion 135 with discomfort.  Right wrist extension 20 and flexion 50.    Time 4    Period Weeks    Status New    Target Date 10/17/21      OT LONG TERM GOAL #3   Title Right elbow and wrist strength improved for patient to be able to push and pull door as well as turn doorknob carrying left pots and pains while cooking with no increase symptoms    Baseline Patient 4 weeks postop no strengthening yet.  Patient not using right arm and any resisted pulling and pushing or lifting    Time 8    Period Weeks    Status New    Target Date 11/14/21                   Plan - 09/28/21 1059     Clinical Impression Statement Patient presented OT evaluation with a diagnosis of right dominant hand cubital tunnel release as well as open carpal tunnel release on 08/22/2021.  Patient is 5 weeks postop.  Patient with thick scar tissue as well as hypersensitivity-(hard time tolerate soft massage, scar massage and soft textures) and scar adhesion at carpal tunnel more than elbow limiting her range of motion for mostly wrist extension of flexion but improving.  Focus this date on scar massage to CT scar using  cica scar pad - AAROM wrist flexion, extenion- add Med N glide and pt to cont ulnar N glide. Did fabricate her some padded elbow sleeve to use when sitting to decrease tenderness and soreness at cubital tunnel.  Initiated paraffin to hand and wrist today but ice afterward- pt to cont with contrast at elbow and at home. Pt continues to report increased pain in right elbow and hand, reponding well to contrast for edema control.  Pt able to demonstrate scar massage techniques but has some hypersensitivity at scar sites. Patient can benefit from skilled OT services for decrease edema and pain and increased motion and strength in right dominant arm to return to prior level of function..    OT Occupational Profile and History Problem Focused Assessment - Including review of records relating to presenting problem    Occupational performance deficits (Please refer to evaluation for details): ADL's;IADL's;Rest and Sleep;Work;Play;Leisure;Social Participation    Body Structure / Function / Physical Skills ADL;Strength;Pain;UE functional use;ROM;IADL;Scar mobility;Sensation;Decreased knowledge of precautions;Edema    Rehab Potential Good    Clinical Decision Making Limited treatment options, no task modification necessary    Comorbidities Affecting Occupational Performance: May have comorbidities impacting occupational performance    Modification or Assistance to Complete Evaluation  No modification of tasks or assist necessary to complete eval    OT Frequency 2x /  week    OT Duration 8 weeks    OT Treatment/Interventions Self-care/ADL training;Paraffin;Moist Heat;Fluidtherapy;Contrast Bath;Therapeutic exercise;Manual Therapy;Patient/family education;Passive range of motion;Scar mobilization;DME and/or AE instruction    Consulted and Agree with Plan of Care Patient             Patient will benefit from skilled therapeutic intervention in order to improve the following deficits and impairments:   Body  Structure / Function / Physical Skills: ADL, Strength, Pain, UE functional use, ROM, IADL, Scar mobility, Sensation, Decreased knowledge of precautions, Edema       Visit Diagnosis: Pain in right wrist  Pain in right hand  Stiffness of right wrist, not elsewhere classified  Muscle weakness (generalized)  Scar condition and fibrosis of skin  Pain in right elbow  Stiffness of right elbow, not elsewhere classified    Problem List Patient Active Problem List   Diagnosis Date Noted   Cubital tunnel syndrome, right 08/18/2021   Bilateral thumb pain 04/03/2021   Adrenal insufficiency ([place]) 01/26/2021   Asthma 01/26/2021   GERD (gastroesophageal reflux disease) 01/26/2021   Tobacco use 01/26/2021   CAP (community acquired pneumonia) 01/26/2021   Carpal tunnel syndrome, right 08/19/2020   Tear of left acetabular labrum 05/06/2015   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 [place], [address] Phone: [phone]   Fax:  [phone]  Name: Margaret Gates MRN: [id number] Date of Birth: 27-Sep-1980

## 2021-10-02 ENCOUNTER — Ambulatory Visit: Payer: BC Managed Care – PPO | Attending: Surgery | Admitting: Occupational Therapy

## 2021-10-02 DIAGNOSIS — M79641 Pain in right hand: Secondary | ICD-10-CM | POA: Diagnosis present

## 2021-10-02 DIAGNOSIS — M25531 Pain in right wrist: Secondary | ICD-10-CM | POA: Diagnosis present

## 2021-10-02 DIAGNOSIS — M25621 Stiffness of right elbow, not elsewhere classified: Secondary | ICD-10-CM | POA: Diagnosis present

## 2021-10-02 DIAGNOSIS — M6281 Muscle weakness (generalized): Secondary | ICD-10-CM | POA: Insufficient documentation

## 2021-10-02 DIAGNOSIS — L905 Scar conditions and fibrosis of skin: Secondary | ICD-10-CM | POA: Diagnosis present

## 2021-10-02 DIAGNOSIS — M25631 Stiffness of right wrist, not elsewhere classified: Secondary | ICD-10-CM | POA: Diagnosis present

## 2021-10-02 DIAGNOSIS — M25521 Pain in right elbow: Secondary | ICD-10-CM | POA: Insufficient documentation

## 2021-10-02 NOTE — Therapy (Signed)
[facility] [address] [place], [address] Phone: [phone]   Fax:  [phone]  Occupational Therapy Treatment  Patient Details  Name: Margaret Gates MRN: [id number] Date of Birth: 04-30-1980 Referring Provider (OT): Poggi/Mchgee   Encounter Date: 10/02/2021   OT End of Session - 10/02/21 1054     Visit Number 4    Number of Visits 16    Date for OT Re-Evaluation 11/14/21    OT Start Time 1035    OT Stop Time 1114    OT Time Calculation (min) 39 min    Activity Tolerance Patient tolerated treatment well    Behavior During Therapy [facility] for tasks assessed/performed             Past Medical History:  Diagnosis Date   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[place])    Asthma    Bronchitis    GERD (gastroesophageal reflux disease)    Headache    Severe sepsis ([place]) 01/26/2021   Tobacco use     Past Surgical History:  Procedure Laterality Date   CARPAL TUNNEL RELEASE Right 08/31/2020   Procedure: CARPAL TUNNEL RELEASE ENDOSCOPIC;  Surgeon: Corky Mull, MD;  Location: [facility];  Service: Orthopedics;  Laterality: Right;   CARPAL TUNNEL RELEASE Left 10/26/2020   Procedure: CARPAL TUNNEL RELEASE ENDOSCOPIC;  Surgeon: Corky Mull, MD;  Location: [facility];  Service: Orthopedics;  Laterality: Left;   CARPAL TUNNEL RELEASE Right 08/22/2021   Procedure: OPEN REVISION RIGHT CARPAL TUNNEL RELEASE;  Surgeon: Corky Mull, MD;  Location: [facility];  Service: Orthopedics;  Laterality: Right;   CESAREAN SECTION     HIP SURGERY Bilateral    LAPROSCOPIC BONE SPURS   ULNAR NERVE TRANSPOSITION Right 08/22/2021   Procedure: SUBCUTANEOUS TRANSPOSITION OF THE ULNAR NERVE AT RIGHT ELBOW.;  Surgeon: Corky Mull, MD;  Location: [facility];  Service: Orthopedics;  Laterality: Right;    There were no vitals filed for this visit.   Subjective Assessment - 10/02/21 1038     Subjective  Over all the numbness in hand is better-  still some at the elbow- Elbow still tender cannot bend it behind my head and behind my back- cannot rest on my elbow- scar getting little better but still tender to textures - working on rougher now    Pertinent History Pt follow up with ortho on 09/01/21  for her first postop appointment following a open right carpal tunnel release in addition to subcutaneous anterior transposition of the ulnar nerve at the right elbow. Surgery was performed by Dr. Roland Rack on 08/22/2021. Overall the patient feels that she is doing well, she does report moderate swelling in the right hand however this does improve when she is able to elevate the hand and apply ice. She states that her numbness and tingling has improved to the right upper extremity after surgery. She denies any falls or trauma affecting the right upper extremity since surgery, she denies any signs of infection at home such as fevers or chills. The patient has been out of work since her date of surgery. The patient is taking occasional oxycodone in addition to ibuprofen and Tylenol. The patient denies any catching or locking symptoms in either the right wrist or elbow at this time. 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    Currently in Pain? Yes    Pain Score 2     Pain Location Wrist   elobw   Pain Orientation Right    Pain Descriptors / Indicators Sore;Tightness    Pain Type Surgical pain    Pain Onset More than a month ago    Pain Frequency Intermittent                [facility] OT Assessment - 10/02/21 0001       AROM   Right Elbow Flexion 140    Right Elbow Extension 0    Right Wrist Extension 65 Degrees    Right Wrist Flexion 80 Degrees   end of session 75 composite flexion     Right Hand AROM   R Thumb Radial ABduction/ADduction 0-55 55    R Thumb Palmar ABduction/ADduction 0-45 60    R Thumb Opposition to Index --   base  of 5th   R Index  MCP 0-90 90 Degrees    R Index PIP 0-100 100 Degrees    R Long  MCP 0-90 90 Degrees    R Long PIP 0-100 100 Degrees    R Ring  MCP 0-90 85 Degrees    R Ring PIP 0-100 100 Degrees    R Little  MCP 0-90 85 Degrees    R Little PIP 0-100 100 Degrees             Add shoulder AROM in combination with ext and int rotation for functional tasks - increase motion with less pain after contrast  Pt to do in front of mirror -and copy L UE   2-3 x day - 5 reps          OT Treatments/Exercises (OP) - 10/02/21 0001       RUE Paraffin   Number Minutes Paraffin 8 Minutes    RUE Paraffin Location Wrist   hand   Comments prior to scar tissue      RUE Contrast Bath   Time 8 minutes    Comments elbow prior to nerve glide and ROM , scar tissue               Contrast  shorter done to right elbow decrease edema, decrease pain and increase motion - full extention.   Initiated paraffin to wrist and hand for scar mobs - ice end after manual    manual therapy for emphasis on scar massage both elbow and wrist.  Most focus at wrist. Pt has some tenderness and hypersensitivity along scar line.  She is attempting to perform desensitization techniques over scar.  Utilized a small piece of silicone scar pad over scar to decrease sensitivity and improve toleration of scar massage- manually , mini massager - done and review using piece of coban for scar mobs -  Cont desensitization on both scars -       Therapeutic Exercise:   Pt performing tendon glides  Wrist AROM for composite flexion and extention over edge of table - 12 reps And add table slide gentle -tolerate well on towel 20 reps  Prior to Ulnar nerve glide for 5 reps only  and Med N glide - 5 reps       Home exercises can be done 2-3 times a day             OT Education - 10/02/21 1053     Education Details home exercise program, positioning    Person(s) Educated  Patient    Methods  Explanation;Demonstration;Tactile cues;Verbal cues;Handout    Comprehension Verbal cues required;Returned demonstration;Verbalized understanding              OT Short Term Goals - 09/19/21 1135       OT SHORT TERM GOAL #1   Title Patient to be independent in home program to decrease scar tissue and pain with increased motion and wrist and elbow flexion extension.    Baseline Patient reports pain 6-8/10 increasing with use and by the end of the day.  Scar tissue still thick and adhere.  As well as hypersensitivity.  Decreased flexion extension at wrist and elbow.    Time 3    Period Weeks    Status New    Target Date 10/10/21               OT Long Term Goals - 09/19/21 1136       OT LONG TERM GOAL #1   Title Hypersensitivity over both scars improved for patient to tolerate different textures, clothing and using tools without increase symptoms.    Baseline Patient sensitive to soft massage, scar massage and soft textures.  Patient to initiate that as well as a rougher textures like a towel for drying.    Time 4    Period Weeks    Status New    Target Date 10/17/21      OT LONG TERM GOAL #2   Title Right elbow and wrist active range of motion increased to within normal limits for patient to be able to don and doff overhead sweater and bra with no increase symptoms.    Baseline Discomfort and pain increase 6-8/10 with use of right arm per patient.  Right elbow extension -14, flexion 135 with discomfort.  Right wrist extension 20 and flexion 50.    Time 4    Period Weeks    Status New    Target Date 10/17/21      OT LONG TERM GOAL #3   Title Right elbow and wrist strength improved for patient to be able to push and pull door as well as turn doorknob carrying left pots and pains while cooking with no increase symptoms    Baseline Patient 4 weeks postop no strengthening yet.  Patient not using right arm and any resisted pulling and pushing or lifting    Time 8    Period  Weeks    Status New    Target Date 11/14/21                   Plan - 10/02/21 1054     Clinical Impression Statement Patient presented OT evaluation with a diagnosis of right dominant hand cubital tunnel release as well as open carpal tunnel release on 08/22/2021.  Patient is 5 1/2 weeks postop.  Patient with thick scar tissue as well as hypersensitivity-(hard time tolerate soft massage, scar massage and soft textures) and scar adhesion at carpal tunnel more than elbow limiting her range of motion for mostly wrist extension of flexion but improving.  Focus this date on scar massage to CT scar using cica scar pad/  coban -add to HEP - AAROM wrist composite flexion, extention with some table slides this date - prior to  Med and Ulnar N glides. Did fabricate her some padded elbow sleeve  last time to use when sitting to decrease tenderness and soreness at cubital tunnel.  Done again paraffin to hand and wrist today but ice afterward-  pt to cont with contrast at elbow and at home. Pt continues to show increase AROM in R UE and decrease painl.  Pt able to demonstrate scar massage techniques but has some hypersensitivity at scar sites for rougher textures.  Patient can benefit from skilled OT services for decrease edema and pain and increased motion and strength in right dominant arm to return to prior level of function..    OT Occupational Profile and History Problem Focused Assessment - Including review of records relating to presenting problem    Occupational performance deficits (Please refer to evaluation for details): ADL's;IADL's;Rest and Sleep;Work;Play;Leisure;Social Participation    Body Structure / Function / Physical Skills ADL;Strength;Pain;UE functional use;ROM;IADL;Scar mobility;Sensation;Decreased knowledge of precautions;Edema    Rehab Potential Good    Clinical Decision Making Limited treatment options, no task modification necessary    Comorbidities Affecting Occupational  Performance: May have comorbidities impacting occupational performance    Modification or Assistance to Complete Evaluation  No modification of tasks or assist necessary to complete eval    OT Frequency 2x / week    OT Duration 8 weeks    OT Treatment/Interventions Self-care/ADL training;Paraffin;Moist Heat;Fluidtherapy;Contrast Bath;Therapeutic exercise;Manual Therapy;Patient/family education;Passive range of motion;Scar mobilization;DME and/or AE instruction    Consulted and Agree with Plan of Care Patient             Patient will benefit from skilled therapeutic intervention in order to improve the following deficits and impairments:   Body Structure / Function / Physical Skills: ADL, Strength, Pain, UE functional use, ROM, IADL, Scar mobility, Sensation, Decreased knowledge of precautions, Edema       Visit Diagnosis: Pain in right wrist  Pain in right hand  Stiffness of right wrist, not elsewhere classified  Muscle weakness (generalized)  Scar condition and fibrosis of skin  Pain in right elbow  Stiffness of right elbow, not elsewhere classified    Problem List Patient Active Problem List   Diagnosis Date Noted   Cubital tunnel syndrome, right 08/18/2021   Bilateral thumb pain 04/03/2021   Adrenal insufficiency ([place]) 01/26/2021   Asthma 01/26/2021   GERD (gastroesophageal reflux disease) 01/26/2021   Tobacco use 01/26/2021   CAP (community acquired pneumonia) 01/26/2021   Carpal tunnel syndrome, right 08/19/2020   Tear of left acetabular labrum 05/06/2015   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 [place], [address] Phone: [phone]   Fax:  [phone]  Name: Margaret Gates MRN:  [id number] Date of Birth: 1980-09-26

## 2021-10-05 ENCOUNTER — Ambulatory Visit: Payer: BC Managed Care – PPO | Admitting: Occupational Therapy

## 2021-10-05 DIAGNOSIS — L905 Scar conditions and fibrosis of skin: Secondary | ICD-10-CM

## 2021-10-05 DIAGNOSIS — M25631 Stiffness of right wrist, not elsewhere classified: Secondary | ICD-10-CM

## 2021-10-05 DIAGNOSIS — M25531 Pain in right wrist: Secondary | ICD-10-CM

## 2021-10-05 DIAGNOSIS — M79641 Pain in right hand: Secondary | ICD-10-CM

## 2021-10-05 DIAGNOSIS — M6281 Muscle weakness (generalized): Secondary | ICD-10-CM

## 2021-10-05 NOTE — Therapy (Signed)
[facility] 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[place])    Asthma    Bronchitis    GERD (gastroesophageal reflux disease)    Headache    Severe sepsis ([place]) 01/26/2021   Tobacco use     Past Surgical History:  Procedure Laterality Date   CARPAL TUNNEL RELEASE Right 08/31/2020   Procedure: CARPAL TUNNEL RELEASE ENDOSCOPIC;  Surgeon: Corky Mull, MD;  Location: [facility];  Service: Orthopedics;  Laterality: Right;   CARPAL TUNNEL RELEASE Left 10/26/2020   Procedure: CARPAL TUNNEL RELEASE ENDOSCOPIC;  Surgeon: Corky Mull, MD;  Location: [facility];  Service: Orthopedics;  Laterality: Left;   CARPAL TUNNEL RELEASE Right 08/22/2021   Procedure: OPEN REVISION RIGHT CARPAL TUNNEL RELEASE;  Surgeon: Corky Mull, MD;  Location: [facility];  Service: Orthopedics;  Laterality: Right;   CESAREAN SECTION     HIP SURGERY Bilateral    LAPROSCOPIC BONE SPURS   ULNAR NERVE TRANSPOSITION Right 08/22/2021   Procedure: SUBCUTANEOUS TRANSPOSITION OF THE ULNAR NERVE AT RIGHT ELBOW.;  Surgeon: Corky Mull, MD;  Location: [facility];  Service: Orthopedics;  Laterality: Right;    There were no vitals filed for this visit.   Subjective Assessment - 10/05/21 1007     Subjective  Motion better, pain better - numbness in hand better but stil in elbow - pain and tenderness mostly in elbow more than hand- scar better but sore in the middle - but using it more but lacking strength still    Pertinent History Pt follow up with ortho on 09/01/21  for her first postop appointment following a open right carpal tunnel release in addition to subcutaneous anterior  transposition of the ulnar nerve at the right elbow. Surgery was performed by Dr. Roland Rack on 08/22/2021. Overall the patient feels that she is doing well, she does report moderate swelling in the right hand however this does improve when she is able to elevate the hand and apply ice. She states that her numbness and tingling has improved to the right upper extremity after surgery. She denies any falls or trauma affecting the right upper extremity since surgery, she denies any signs of infection at home such as fevers or chills. The patient has been out of work since her date of surgery. The patient is taking occasional oxycodone in addition to ibuprofen and Tylenol. The patient denies any catching or locking symptoms in either the right wrist or elbow at this time. 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    Currently in Pain? No/denies   more stiffness               [facility] OT Assessment - 10/05/21 0001       Strength   Right Hand Grip (lbs) 36    Right Hand Lateral Pinch 13 lbs    Right Hand 3 Point Pinch 8 lbs    Left Hand Lateral Pinch 16 lbs    Left Hand 3 Point Pinch 14 lbs  Digits AROM WNL - wrist flexion WNL but extention decrease - pt done table slides 20 reps  Prior to wrist 1 lbs weight in all planes 12 reps pain free  Add also elbow flexion , ext 1 lbs 12-15 reps            OT Treatments/Exercises (OP) - 10/05/21 0001       RUE Paraffin   Number Minutes Paraffin 8 Minutes    RUE Paraffin Location Wrist   hand   Comments prior to scar massage and motion      RUE Contrast Bath   Time 8 minutes    Comments decreas estiffness               Contrast  done to right elbow decrease edema, decrease pain and increase motion - full extention.   paraffin to wrist and hand prior to scar massage   manual therapy for emphasis on scar  massage both elbow and wrist.  Most focus at wrist. Pt has some tenderness and hypersensitivity along scar line.  She is attempting to perform desensitization techniques over scar.  Utilized a small piece of silicone scar pad over scar to decrease sensitivity and improve toleration of scar massage- manually , mini massager - done and review using piece of coban for scar mobs - with great success Cont desensitization on both scars -       Therapeutic Exercise:   Pt performing tendon glides  And add table slide gentle -tolerate well on towel 20 reps  cont with Ulnar nerve glide for 5 reps only  and Med N glide - 5 reps    done this date UBE 4 min - 2 min each direction - no issues    Home exercises can be done 2-3 times a day         OT Education - 10/05/21 1010     Education Details home exercise program, positioning    Person(s) Educated Patient    Methods Explanation;Demonstration;Tactile cues;Verbal cues;Handout    Comprehension Verbal cues required;Returned demonstration;Verbalized understanding              OT Short Term Goals - 09/19/21 1135       OT SHORT TERM GOAL #1   Title Patient to be independent in home program to decrease scar tissue and pain with increased motion and wrist and elbow flexion extension.    Baseline Patient reports pain 6-8/10 increasing with use and by the end of the day.  Scar tissue still thick and adhere.  As well as hypersensitivity.  Decreased flexion extension at wrist and elbow.    Time 3    Period Weeks    Status New    Target Date 10/10/21               OT Long Term Goals - 09/19/21 1136       OT LONG TERM GOAL #1   Title Hypersensitivity over both scars improved for patient to tolerate different textures, clothing and using tools without increase symptoms.    Baseline Patient sensitive to soft massage, scar massage and soft textures.  Patient to initiate that as well as a rougher textures like a towel for drying.    Time 4     Period Weeks    Status New    Target Date 10/17/21      OT LONG TERM GOAL #2   Title Right elbow and wrist active range of motion increased to within normal limits  for patient to be able to don and doff overhead sweater and bra with no increase symptoms.    Baseline Discomfort and pain increase 6-8/10 with use of right arm per patient.  Right elbow extension -14, flexion 135 with discomfort.  Right wrist extension 20 and flexion 50.    Time 4    Period Weeks    Status New    Target Date 10/17/21      OT LONG TERM GOAL #3   Title Right elbow and wrist strength improved for patient to be able to push and pull door as well as turn doorknob carrying left pots and pains while cooking with no increase symptoms    Baseline Patient 4 weeks postop no strengthening yet.  Patient not using right arm and any resisted pulling and pushing or lifting    Time 8    Period Weeks    Status New    Target Date 11/14/21                   Plan - 10/05/21 1011     Clinical Impression Statement Patient presented OT evaluation with a diagnosis of right dominant hand cubital tunnel release as well as open carpal tunnel release on 08/22/2021.  Patient is 6 weeks postop.  Patient with thick scar tissue as well as hypersensitivity-(hard time tolerate soft massage, scar massage and soft textures) and scar adhesion at carpal tunnel more than elbow limiting her range of motion for mostly wrist extension of flexion at eval  but improving. Was able to initiate strengthening this date for wrist in all planes and elbow - 1 lbs - pt to cont with scar massage and tendon glides - with wrist extention table slides prior to 1 lbs weight. Grip and prehension strength assess this date  Did fabricate her some padded elbow sleeve last week to use when sitting to decrease tenderness and soreness at cubital tunnel. Pt continues to show increase AROM in R UE and decrease painl.  Pt able to demonstrate scar massage techniques but  has some hypersensitivity at scar sites for rougher textures and massage.  Patient can benefit from skilled OT services for decrease edema and pain and increased motion and strength in right dominant arm to return to prior level of function..    OT Occupational Profile and History Problem Focused Assessment - Including review of records relating to presenting problem    Occupational performance deficits (Please refer to evaluation for details): ADL's;IADL's;Rest and Sleep;Work;Play;Leisure;Social Participation    Body Structure / Function / Physical Skills ADL;Strength;Pain;UE functional use;ROM;IADL;Scar mobility;Sensation;Decreased knowledge of precautions;Edema    Rehab Potential Good    Clinical Decision Making Limited treatment options, no task modification necessary    Comorbidities Affecting Occupational Performance: May have comorbidities impacting occupational performance    Modification or Assistance to Complete Evaluation  No modification of tasks or assist necessary to complete eval    OT Frequency 2x / week    OT Duration 8 weeks    OT Treatment/Interventions Self-care/ADL training;Paraffin;Moist Heat;Fluidtherapy;Contrast Bath;Therapeutic exercise;Manual Therapy;Patient/family education;Passive range of motion;Scar mobilization;DME and/or AE instruction    Consulted and Agree with Plan of Care Patient             Patient will benefit from skilled therapeutic intervention in order to improve the following deficits and impairments:   Body Structure / Function / Physical Skills: ADL, Strength, Pain, UE functional use, ROM, IADL, Scar mobility, Sensation, Decreased knowledge of precautions, Edema  Visit Diagnosis: Pain in right wrist  Pain in right hand  Stiffness of right wrist, not elsewhere classified  Muscle weakness (generalized)  Scar condition and fibrosis of skin    Problem List Patient Active Problem List   Diagnosis Date Noted   Cubital tunnel  syndrome, right 08/18/2021   Bilateral thumb pain 04/03/2021   Adrenal insufficiency ([place]) 01/26/2021   Asthma 01/26/2021   GERD (gastroesophageal reflux disease) 01/26/2021   Tobacco use 01/26/2021   CAP (community acquired pneumonia) 01/26/2021   Carpal tunnel syndrome, right 08/19/2020   Tear of left acetabular labrum 05/06/2015   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, [place], [address] Phone: [phone]   Fax:  [phone]  Name: Alexis Reber MRN: [id number] Date of Birth: Jun 15, 1980

## 2021-10-10 ENCOUNTER — Ambulatory Visit: Payer: BC Managed Care – PPO | Admitting: Occupational Therapy

## 2021-10-10 DIAGNOSIS — M25531 Pain in right wrist: Secondary | ICD-10-CM

## 2021-10-10 DIAGNOSIS — M25631 Stiffness of right wrist, not elsewhere classified: Secondary | ICD-10-CM

## 2021-10-10 DIAGNOSIS — M25621 Stiffness of right elbow, not elsewhere classified: Secondary | ICD-10-CM

## 2021-10-10 DIAGNOSIS — L905 Scar conditions and fibrosis of skin: Secondary | ICD-10-CM

## 2021-10-10 DIAGNOSIS — M25521 Pain in right elbow: Secondary | ICD-10-CM

## 2021-10-10 DIAGNOSIS — M6281 Muscle weakness (generalized): Secondary | ICD-10-CM

## 2021-10-10 DIAGNOSIS — M79641 Pain in right hand: Secondary | ICD-10-CM

## 2021-10-10 NOTE — Therapy (Signed)
[facility] [address], [place], [address] Phone: [phone]   Fax:  [phone]  Occupational Therapy Treatment  Patient Details  Name: Margaret Gates MRN: [id number] Date of Birth: [birth year] Referring Provider (OT): Poggi/Mchgee   Encounter Date: 10/10/2021   OT End of Session - 10/10/21 0949     Visit Number 6    Number of Visits 16    Date for OT Re-Evaluation 11/14/21    OT Start Time 0949    OT Stop Time 1031    OT Time Calculation (min) 42 min    Activity Tolerance Patient tolerated treatment well    Behavior During Therapy [facility] for tasks assessed/performed             Past Medical History:  Diagnosis Date   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)    Asthma    Bronchitis    GERD (gastroesophageal reflux disease)    Headache    Severe sepsis ([place]) 01/26/2021   Tobacco use     Past Surgical History:  Procedure Laterality Date   CARPAL TUNNEL RELEASE Right 08/31/2020   Procedure: CARPAL TUNNEL RELEASE ENDOSCOPIC;  Surgeon: Corky Mull, MD;  Location: [facility];  Service: Orthopedics;  Laterality: Right;   CARPAL TUNNEL RELEASE Left 10/26/2020   Procedure: CARPAL TUNNEL RELEASE ENDOSCOPIC;  Surgeon: Corky Mull, MD;  Location: [facility];  Service: Orthopedics;  Laterality: Left;   CARPAL TUNNEL RELEASE Right 08/22/2021   Procedure: OPEN REVISION RIGHT CARPAL TUNNEL RELEASE;  Surgeon: Corky Mull, MD;  Location: [facility];  Service: Orthopedics;  Laterality: Right;   CESAREAN SECTION     HIP SURGERY Bilateral    LAPROSCOPIC BONE SPURS   ULNAR NERVE TRANSPOSITION Right 08/22/2021   Procedure: SUBCUTANEOUS TRANSPOSITION OF THE ULNAR NERVE AT RIGHT ELBOW.;  Surgeon: Corky Mull, MD;  Location: [facility];  Service: Orthopedics;  Laterality: Right;    There were no vitals filed for this visit.   Subjective Assessment - 10/10/21 0949     Subjective  I have seen DR Poggi - doing better -  expected to still be tender on my R elbow - numbness doing better and scar tissue- still doing my massage, rougher textures and tapping- - did okay with weight    Pertinent History Pt follow up with ortho on 09/01/21  for her first postop appointment following a open right carpal tunnel release in addition to subcutaneous anterior transposition of the ulnar nerve at the right elbow. Surgery was performed by Dr. Roland Rack on 08/22/2021. Overall the patient feels that she is doing well, she does report moderate swelling in the right hand however this does improve when she is able to elevate the hand and apply ice. She states that her numbness and tingling has improved to the right upper extremity after surgery. She denies any falls or trauma affecting the right upper extremity since surgery, she denies any signs of infection at home such as fevers or chills. The patient has been out of work since her date of surgery. The patient is taking occasional oxycodone in addition to ibuprofen and Tylenol. The patient denies any catching or locking symptoms in either the right wrist or elbow at this time. 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    Currently in  Pain? No/denies                          OT Treatments/Exercises (OP) - 10/10/21 0001       RUE Paraffin   Number Minutes Paraffin 8 Minutes    RUE Paraffin Location --   hand and wrist   Comments prior to scar massage      RUE Contrast Bath   Time 8 minutes    Comments decrease edema nad stiffness elbow              Contrast  done to right elbow decrease edema, decrease pain and increase motion - full extention.   paraffin to wrist and hand prior to scar massage    manual therapy for emphasis on scar massage both elbow and wrist.  Most focus at wrist. Pt has some tenderness and hypersensitivity along scar line.  She  is attempting to perform desensitization techniques over scar.  Utilized a small piece of silicone scar pad over scar to decrease sensitivity and improve toleration of scar massage- manually , mini massager - done and review using piece of coban for scar mobs - with great success Cont desensitization on both scars -     New cica scarpad provided for pt elbow and wrist night time    Therapeutic Exercise:   Pt performing tendon glides  COnt with table slide gentle -tolerate well on towel 20 reps  cont with Ulnar nerve glide for 5 reps only  and Med N glide - 5 reps    upgrade to 2 lbs for wrist in all planes painfree 12-15 reps And elbow flexion to 90 and shoulder punches 15 reps  2 x day until next appt    Home exercises can be done 2-3 times a day          OT Education - 10/10/21 1547     Education Details home exercise program, positioning    Person(s) Educated Patient    Methods Explanation;Demonstration;Tactile cues;Verbal cues;Handout    Comprehension Verbal cues required;Returned demonstration;Verbalized understanding              OT Short Term Goals - 09/19/21 1135       OT SHORT TERM GOAL #1   Title Patient to be independent in home program to decrease scar tissue and pain with increased motion and wrist and elbow flexion extension.    Baseline Patient reports pain 6-8/10 increasing with use and by the end of the day.  Scar tissue still thick and adhere.  As well as hypersensitivity.  Decreased flexion extension at wrist and elbow.    Time 3    Period Weeks    Status New    Target Date 10/10/21               OT Long Term Goals - 09/19/21 1136       OT LONG TERM GOAL #1   Title Hypersensitivity over both scars improved for patient to tolerate different textures, clothing and using tools without increase symptoms.    Baseline Patient sensitive to soft massage, scar massage and soft textures.  Patient to initiate that as well as a rougher textures like a  towel for drying.    Time 4    Period Weeks    Status New    Target Date 10/17/21      OT LONG TERM GOAL #2   Title Right elbow and wrist active range of motion  increased to within normal limits for patient to be able to don and doff overhead sweater and bra with no increase symptoms.    Baseline Discomfort and pain increase 6-8/10 with use of right arm per patient.  Right elbow extension -14, flexion 135 with discomfort.  Right wrist extension 20 and flexion 50.    Time 4    Period Weeks    Status New    Target Date 10/17/21      OT LONG TERM GOAL #3   Title Right elbow and wrist strength improved for patient to be able to push and pull door as well as turn doorknob carrying left pots and pains while cooking with no increase symptoms    Baseline Patient 4 weeks postop no strengthening yet.  Patient not using right arm and any resisted pulling and pushing or lifting    Time 8    Period Weeks    Status New    Target Date 11/14/21                   Plan - 10/10/21 0949     Clinical Impression Statement Patient presented OT evaluation with a diagnosis of right dominant hand cubital tunnel release as well as open carpal tunnel release on 08/22/2021.  Patient is 7 weeks postop.  Patient with thick scar tissue as well as hypersensitivity-(hard time tolerate soft massage, scar massage and soft textures) and scar adhesion at carpal tunnel more than elbow limiting her range of motion for mostly wrist extension of flexion at eval  but improving. Was able to initiate strengthening last week and increase this date to 2 lbs  for wrist in all planes and elbow. Pt to cont with scar massage and tendon glides - with wrist extention table slides prior to 2 lbs weight. Grip and prehension strength assess last week. Pt continues to show increase AROM in R UE and decrease pain.  Pt able to demonstrate scar massage techniques but has some hypersensitivity at scar sites for rougher textures and massage.   Patient can benefit from skilled OT services for decrease edema and pain and increased motion and strength in right dominant arm to return to prior level of function..    OT Occupational Profile and History Problem Focused Assessment - Including review of records relating to presenting problem    Occupational performance deficits (Please refer to evaluation for details): ADL's;IADL's;Rest and Sleep;Work;Play;Leisure;Social Participation    Body Structure / Function / Physical Skills ADL;Strength;Pain;UE functional use;ROM;IADL;Scar mobility;Sensation;Decreased knowledge of precautions;Edema    Rehab Potential Good    Clinical Decision Making Limited treatment options, no task modification necessary    Comorbidities Affecting Occupational Performance: May have comorbidities impacting occupational performance    Modification or Assistance to Complete Evaluation  No modification of tasks or assist necessary to complete eval    OT Frequency 2x / week    OT Duration 8 weeks    OT Treatment/Interventions Self-care/ADL training;Paraffin;Moist Heat;Fluidtherapy;Contrast Bath;Therapeutic exercise;Manual Therapy;Patient/family education;Passive range of motion;Scar mobilization;DME and/or AE instruction    Consulted and Agree with Plan of Care Patient             Patient will benefit from skilled therapeutic intervention in order to improve the following deficits and impairments:   Body Structure / Function / Physical Skills: ADL, Strength, Pain, UE functional use, ROM, IADL, Scar mobility, Sensation, Decreased knowledge of precautions, Edema       Visit Diagnosis: Pain in right wrist  Stiffness of right wrist,  not elsewhere classified  Pain in right hand  Muscle weakness (generalized)  Scar condition and fibrosis of skin  Pain in right elbow  Stiffness of right elbow, not elsewhere classified    Problem List Patient Active Problem List   Diagnosis Date Noted   Cubital tunnel  syndrome, right 08/18/2021   Bilateral thumb pain 04/03/2021   Adrenal 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 [place], [address] Phone: [phone]   Fax:  [phone]  Name: Naiomy Watters MRN: [id number] Date of Birth: 1980-07-27

## 2021-10-12 ENCOUNTER — Ambulatory Visit: Payer: BC Managed Care – PPO | Admitting: Occupational Therapy

## 2021-10-12 DIAGNOSIS — M25531 Pain in right wrist: Secondary | ICD-10-CM

## 2021-10-12 DIAGNOSIS — M6281 Muscle weakness (generalized): Secondary | ICD-10-CM

## 2021-10-12 DIAGNOSIS — M79641 Pain in right hand: Secondary | ICD-10-CM

## 2021-10-12 DIAGNOSIS — M25521 Pain in right elbow: Secondary | ICD-10-CM

## 2021-10-12 DIAGNOSIS — M25631 Stiffness of right wrist, not elsewhere classified: Secondary | ICD-10-CM

## 2021-10-12 DIAGNOSIS — L905 Scar conditions and fibrosis of skin: Secondary | ICD-10-CM

## 2021-10-12 NOTE — Therapy (Signed)
[facility] [address], [place], [address] Phone: [phone]   Fax:  [phone]  Occupational Therapy Treatment  Patient Details  Name: Margaret Gates MRN: [id number] Date of Birth: May 30, 1980 Referring Provider (OT): Poggi/Mchgee   Encounter Date: 10/12/2021   OT End of Session - 10/12/21 0949     Visit Number 7    Number of Visits 16    Date for OT Re-Evaluation 11/14/21    OT Start Time 0949    OT Stop Time 1035    OT Time Calculation (min) 46 min    Activity Tolerance Patient tolerated treatment well    Behavior During Therapy [facility] for tasks assessed/performed             Past Medical History:  Diagnosis Date   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[place])    Asthma    Bronchitis    GERD (gastroesophageal reflux disease)    Headache    Severe sepsis ([place]) 01/26/2021   Tobacco use     Past Surgical History:  Procedure Laterality Date   CARPAL TUNNEL RELEASE Right 08/31/2020   Procedure: CARPAL TUNNEL RELEASE ENDOSCOPIC;  Surgeon: Corky Mull, MD;  Location: [facility];  Service: Orthopedics;  Laterality: Right;   CARPAL TUNNEL RELEASE Left 10/26/2020   Procedure: CARPAL TUNNEL RELEASE ENDOSCOPIC;  Surgeon: Corky Mull, MD;  Location: [facility];  Service: Orthopedics;  Laterality: Left;   CARPAL TUNNEL RELEASE Right 08/22/2021   Procedure: OPEN REVISION RIGHT CARPAL TUNNEL RELEASE;  Surgeon: Corky Mull, MD;  Location: [facility];  Service: Orthopedics;  Laterality: Right;   CESAREAN SECTION     HIP SURGERY Bilateral    LAPROSCOPIC BONE SPURS   ULNAR NERVE TRANSPOSITION Right 08/22/2021   Procedure: SUBCUTANEOUS TRANSPOSITION OF THE ULNAR NERVE AT RIGHT ELBOW.;  Surgeon: Corky Mull, MD;  Location: [facility];  Service: Orthopedics;  Laterality: Right;    There were no vitals filed for this visit.   Subjective Assessment - 10/12/21 0949     Subjective  Pain really good- but mostly stiffnessat  the elbow last night and in the morning - using it little bit with more ease- planning other arm and hand surgery for Nov    Pertinent History Pt follow up with ortho on 09/01/21  for her first postop appointment following a open right carpal tunnel release in addition to subcutaneous anterior transposition of the ulnar nerve at the right elbow. Surgery was performed by Dr. Roland Rack on 08/22/2021. Overall the patient feels that she is doing well, she does report moderate swelling in the right hand however this does improve when she is able to elevate the hand and apply ice. She states that her numbness and tingling has improved to the right upper extremity after surgery. She denies any falls or trauma affecting the right upper extremity since surgery, she denies any signs of infection at home such as fevers or chills. The patient has been out of work since her date of surgery. The patient is taking occasional oxycodone in addition to ibuprofen and Tylenol. The patient denies any catching or locking symptoms in either the right wrist or elbow at this time. 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    Currently in Pain? No/denies    Pain Descriptors /  Indicators Tightness                [facility] OT Assessment - 10/12/21 0001       AROM   Right Wrist Extension 70 Degrees    Right Wrist Flexion 80 Degrees      Strength   Right Hand Grip (lbs) 50    Right Hand Lateral Pinch 17 lbs    Right Hand 3 Point Pinch 11 lbs    Left Hand Grip (lbs) 75    Left Hand Lateral Pinch 17 lbs    Left Hand 3 Point Pinch 16 lbs                      OT Treatments/Exercises (OP) - 10/12/21 0001       Moist Heat Therapy   Number Minutes Moist Heat 6 Minutes    Moist Heat Location Elbow   R     RUE Paraffin   Number Minutes Paraffin 8 Minutes    RUE Paraffin Location --   R hand and wrist    Comments prior to scar massage and ROM            Contrast  done to right elbow decrease edema, decrease stiffness - full extention.   paraffin to wrist and hand prior to scar massage    manual therapy for emphasis on scar massage both elbow and wrist.  Most focus at wrist. Pt has some tenderness and hypersensitivity along scar line.  She is attempting to perform desensitization techniques over scar.  Utilized a small piece of silicone scar pad over scar to decrease sensitivity and improve toleration of scar massage- manually , mini massager - done and review using piece of coban for scar mobs - with great success Cont desensitization on both scars -    including clapping hands cica scarpad provided for pt elbow and wrist night time    Therapeutic Exercise:   Pt performing tendon glides  COnt with table slide gentle -tolerate well on towel 20 reps  cont with Ulnar nerve glide for 5 reps only  and Med N glide - 5 reps Add partial wall pushup pain free 12 reps-     2 lbs for wrist in all planes painfree 12-15 reps And elbow flexion to 90 and shoulder punches 15 reps  2 x day until next appt  Increase to 2nd set and can over weekend increase to 3rd set if pain free   Home exercises can be done 2-3 times a day UBE 6 min for UE FW and BW change direction every min - pain free         OT Education - 10/12/21 0949     Education Details home exercise program, positioning    Person(s) Educated Patient    Methods Explanation;Demonstration;Tactile cues;Verbal cues;Handout    Comprehension Verbal cues required;Returned demonstration;Verbalized understanding              OT Short Term Goals - 09/19/21 1135       OT SHORT TERM GOAL #1   Title Patient to be independent in home program to decrease scar tissue and pain with increased motion and wrist and elbow flexion extension.    Baseline Patient reports pain 6-8/10 increasing with use and by the end of the day.  Scar tissue  still thick and adhere.  As well as hypersensitivity.  Decreased flexion extension at wrist and elbow.    Time 3    Period Weeks  Status New    Target Date 10/10/21               OT Long Term Goals - 09/19/21 1136       OT LONG TERM GOAL #1   Title Hypersensitivity over both scars improved for patient to tolerate different textures, clothing and using tools without increase symptoms.    Baseline Patient sensitive to soft massage, scar massage and soft textures.  Patient to initiate that as well as a rougher textures like a towel for drying.    Time 4    Period Weeks    Status New    Target Date 10/17/21      OT LONG TERM GOAL #2   Title Right elbow and wrist active range of motion increased to within normal limits for patient to be able to don and doff overhead sweater and bra with no increase symptoms.    Baseline Discomfort and pain increase 6-8/10 with use of right arm per patient.  Right elbow extension -14, flexion 135 with discomfort.  Right wrist extension 20 and flexion 50.    Time 4    Period Weeks    Status New    Target Date 10/17/21      OT LONG TERM GOAL #3   Title Right elbow and wrist strength improved for patient to be able to push and pull door as well as turn doorknob carrying left pots and pains while cooking with no increase symptoms    Baseline Patient 4 weeks postop no strengthening yet.  Patient not using right arm and any resisted pulling and pushing or lifting    Time 8    Period Weeks    Status New    Target Date 11/14/21                   Plan - 10/12/21 0949     Clinical Impression Statement Patient presented OT evaluation with a diagnosis of right dominant hand cubital tunnel release as well as open carpal tunnel release on 08/22/2021.  Patient is 7 1/2 weeks postop. Pt making great progress in scar tissue adhesions and hyper sensitivity in CTS - still some tenderness and pain distally and middle -as well as elbow tender at around med  epicondyle- pt report stiffness more than pain now. Was able to add partiall  wall push  after table slides pain free. Pt tolerating 2 bs  for wrist in all planes and elbow- pt to increase to 2nd and 3rd set until next week at home.  Pt to cont with scar massage and tendon glides - with wrist extention table slides prior to 2 lbs weight. Grip and prehension strength improved greatly compare to last week.. Pt continues to show increase AROM in R UE and decrease pain.  Pt able to demonstrate scar massage techniques but has some hypersensitivity at scar sites for rougher textures and massage.  Patient can benefit from skilled OT services for decrease edema and pain and increased motion and strength in right dominant arm to return to prior level of function..    OT Occupational Profile and History Problem Focused Assessment - Including review of records relating to presenting problem    Occupational performance deficits (Please refer to evaluation for details): ADL's;IADL's;Rest and Sleep;Work;Play;Leisure;Social Participation    Body Structure / Function / Physical Skills ADL;Strength;Pain;UE functional use;ROM;IADL;Scar mobility;Sensation;Decreased knowledge of precautions;Edema    Rehab Potential Good    Clinical Decision Making Limited treatment options, no task modification  necessary    Comorbidities Affecting Occupational Performance: May have comorbidities impacting occupational performance    Modification or Assistance to Complete Evaluation  No modification of tasks or assist necessary to complete eval    OT Frequency 2x / week    OT Duration 8 weeks    OT Treatment/Interventions Self-care/ADL training;Paraffin;Moist Heat;Fluidtherapy;Contrast Bath;Therapeutic exercise;Manual Therapy;Patient/family education;Passive range of motion;Scar mobilization;DME and/or AE instruction    Consulted and Agree with Plan of Care Patient             Patient will benefit from skilled therapeutic intervention  in order to improve the following deficits and impairments:   Body Structure / Function / Physical Skills: ADL, Strength, Pain, UE functional use, ROM, IADL, Scar mobility, Sensation, Decreased knowledge of precautions, Edema       Visit Diagnosis: Pain in right wrist  Stiffness of right wrist, not elsewhere classified  Pain in right hand  Muscle weakness (generalized)  Scar condition and fibrosis of skin  Pain in right elbow    Problem List Patient Active Problem List   Diagnosis Date Noted   Cubital tunnel syndrome, right 08/18/2021   Bilateral thumb pain 04/03/2021   Adrenal insufficiency ([place]) 01/26/2021   Asthma 01/26/2021   GERD (gastroesophageal reflux disease) 01/26/2021   Tobacco use 01/26/2021   CAP (community acquired pneumonia) 01/26/2021   Carpal tunnel syndrome, right 08/19/2020   Tear of left acetabular labrum 05/06/2015   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 [place], [address] Phone: [phone]   Fax:  [phone]  Name: Margaret Gates MRN: [id number] Date of Birth: 1980-06-28

## 2021-10-17 ENCOUNTER — Ambulatory Visit: Payer: BC Managed Care – PPO | Admitting: Occupational Therapy

## 2021-10-17 DIAGNOSIS — M25531 Pain in right wrist: Secondary | ICD-10-CM

## 2021-10-17 DIAGNOSIS — M25631 Stiffness of right wrist, not elsewhere classified: Secondary | ICD-10-CM

## 2021-10-17 DIAGNOSIS — L905 Scar conditions and fibrosis of skin: Secondary | ICD-10-CM

## 2021-10-17 DIAGNOSIS — M6281 Muscle weakness (generalized): Secondary | ICD-10-CM

## 2021-10-17 DIAGNOSIS — M79641 Pain in right hand: Secondary | ICD-10-CM

## 2021-10-17 NOTE — Therapy (Signed)
[facility] [address], [place], [address] Phone: [phone]   Fax:  [phone]  Occupational Therapy Treatment  Patient Details  Name: Margaret Gates MRN: [id number] Date of Birth: [birth year] Referring Provider (OT): Poggi/Mchgee   Encounter Date: 10/17/2021   OT End of Session - 10/17/21 1159     Visit Number 8    Number of Visits 16    Date for OT Re-Evaluation 11/14/21    OT Start Time 0945    OT Stop Time 1028    OT Time Calculation (min) 43 min    Activity Tolerance Patient tolerated treatment well    Behavior During Therapy [facility] for tasks assessed/performed             Past Medical History:  Diagnosis Date   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[place])    Asthma    Bronchitis    GERD (gastroesophageal reflux disease)    Headache    Severe sepsis ([place]) 01/26/2021   Tobacco use     Past Surgical History:  Procedure Laterality Date   CARPAL TUNNEL RELEASE Right 08/31/2020   Procedure: CARPAL TUNNEL RELEASE ENDOSCOPIC;  Surgeon: Corky Mull, MD;  Location: [facility];  Service: Orthopedics;  Laterality: Right;   CARPAL TUNNEL RELEASE Left 10/26/2020   Procedure: CARPAL TUNNEL RELEASE ENDOSCOPIC;  Surgeon: Corky Mull, MD;  Location: [facility];  Service: Orthopedics;  Laterality: Left;   CARPAL TUNNEL RELEASE Right 08/22/2021   Procedure: OPEN REVISION RIGHT CARPAL TUNNEL RELEASE;  Surgeon: Corky Mull, MD;  Location: [facility];  Service: Orthopedics;  Laterality: Right;   CESAREAN SECTION     HIP SURGERY Bilateral    LAPROSCOPIC BONE SPURS   ULNAR NERVE TRANSPOSITION Right 08/22/2021   Procedure: SUBCUTANEOUS TRANSPOSITION OF THE ULNAR NERVE AT RIGHT ELBOW.;  Surgeon: Corky Mull, MD;  Location: [facility];  Service: Orthopedics;  Laterality: Right;    There were no vitals filed for this visit.   Subjective Assessment - 10/17/21 1158     Subjective  I am doing good.  I think this surgery is  much better than my other ones.  Elbow still little tender at times but not as stiff in the morning anymore.  [place] and I did keep my elbow to my side.  My scar my palm is doing better still little standard and stiff or tight.  But I am trying to do a little bit more    Pertinent History Pt follow up with ortho on 09/01/21  for her first postop appointment following a open right carpal tunnel release in addition to subcutaneous anterior transposition of the ulnar nerve at the right elbow. Surgery was performed by Dr. Roland Rack on 08/22/2021. Overall the patient feels that she is doing well, she does report moderate swelling in the right hand however this does improve when she is able to elevate the hand and apply ice. She states that her numbness and tingling has improved to the right upper extremity after surgery. She denies any falls or trauma affecting the right upper extremity since surgery, she denies any signs of infection at home such as fevers or chills. The patient has been out of work since her date of surgery. The patient is taking occasional oxycodone in addition to ibuprofen and Tylenol. The patient denies any catching or locking symptoms in either the right wrist or elbow at this time. 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    Currently in Pain? No/denies                          OT Treatments/Exercises (OP) - 10/17/21 0001       RUE Paraffin   Number Minutes Paraffin 8 Minutes    RUE Paraffin Location Wrist   hand   Comments Prior to scar massage and wrist extension flexion           With moist heat to elbow and forearm      manual therapy for emphasis on scar massage both elbow and wrist.  Most focus at wrist. Pt has some tenderness and hypersensitivity along scar line.  She is cont desensitization techniques over scar.   Utilized a small piece of silicone scar pad over scar to decrease sensitivity and improve toleration of scar massage- manually , mini massager - done and review using piece of coban for scar mobs - with great success Cont desensitization on both scars -    including clapping hands cica scarpad provided for wrist night time    Therapeutic Exercise:   Pt performing tendon glides  COnt with table slide gentle -tolerate well on towel 20 reps  cont with Ulnar nerve glide for 5 reps only  and Med N glide - 5 reps      2 lbs for wrist in all planes painfree 12-15 reps And elbow flexion to 90 and shoulder punches 15 reps  2 x day 2-3 sets- per pt pain free  Add this date Biodex for scapular squeezes at 10 pounds 15 reps and second set 15 reps at 15 pounds Chest press at 10 pounds kept 2 sets of 15-little tenderness Red Thera-Band for rhomboids pull downs and scapular squeezes 15 reps pain-free add to home program once a day 2 sets of 15-keeping Thera-Band running 3 fingers not over gripping or using thumb. Also red Thera-Band for elbow extension double looped pushing lightly with palm down 15 reps pain-free conduced 2 sets if pain-free at home once a day     Home exercises can be done 2-3 times a day NuStep done at resistance=1 - pain free- 4 min        OT Education - 10/17/21 1159     Education Details home exercise program, positioning    Person(s) Educated Patient    Methods Explanation;Demonstration;Tactile cues;Verbal cues;Handout    Comprehension Verbal cues required;Returned demonstration;Verbalized understanding              OT Short Term Goals - 09/19/21 1135       OT SHORT TERM GOAL #1   Title Patient to be independent in home program to decrease scar tissue and pain with increased motion and wrist and elbow flexion extension.    Baseline Patient reports pain 6-8/10 increasing with use and by the end of the day.  Scar tissue still thick and adhere.  As well as  hypersensitivity.  Decreased flexion extension at wrist and elbow.    Time 3    Period Weeks    Status New    Target Date 10/10/21               OT Long Term Goals - 09/19/21 1136       OT LONG TERM GOAL #1  Title Hypersensitivity over both scars improved for patient to tolerate different textures, clothing and using tools without increase symptoms.    Baseline Patient sensitive to soft massage, scar massage and soft textures.  Patient to initiate that as well as a rougher textures like a towel for drying.    Time 4    Period Weeks    Status New    Target Date 10/17/21      OT LONG TERM GOAL #2   Title Right elbow and wrist active range of motion increased to within normal limits for patient to be able to don and doff overhead sweater and bra with no increase symptoms.    Baseline Discomfort and pain increase 6-8/10 with use of right arm per patient.  Right elbow extension -14, flexion 135 with discomfort.  Right wrist extension 20 and flexion 50.    Time 4    Period Weeks    Status New    Target Date 10/17/21      OT LONG TERM GOAL #3   Title Right elbow and wrist strength improved for patient to be able to push and pull door as well as turn doorknob carrying left pots and pains while cooking with no increase symptoms    Baseline Patient 4 weeks postop no strengthening yet.  Patient not using right arm and any resisted pulling and pushing or lifting    Time 8    Period Weeks    Status New    Target Date 11/14/21                   Plan - 10/17/21 1200     Clinical Impression Statement Patient presented OT evaluation with a diagnosis of right dominant hand cubital tunnel release as well as open carpal tunnel release on 08/22/2021.  Patient is 8 weeks postop. Pt making great progress in scar tissue adhesions and hyper sensitivity in CTS - still some tenderness and pain distally and middle -as well as elbow tender at around med epicondyle- pt report stiffness more  than pain now in hand than elbow.  Pt doing well with 2 lbs  for wrist in all planes and elbow- at home.  Pt to cont with scar massage and tendon glides - with wrist extention table slides prior to 2 lbs weight. Pt continues to show increase AROM in R UE and decrease pain.  Was able to initiate some strengthening on Biodex at 10 and 15 pounds including scapula and shoulders.  Add red Thera-Band for patient for scapular and shoulder strengthening for home.  Pt able to demonstrate scar massage techniques but has some hypersensitivity at scar sites for rougher textures and massage.  Patient can benefit from skilled OT services for decrease edema and pain and increased motion and strength in right dominant arm to return to prior level of function..    OT Occupational Profile and History Problem Focused Assessment - Including review of records relating to presenting problem    Occupational performance deficits (Please refer to evaluation for details): ADL's;IADL's;Rest and Sleep;Work;Play;Leisure;Social Participation    Body Structure / Function / Physical Skills ADL;Strength;Pain;UE functional use;ROM;IADL;Scar mobility;Sensation;Decreased knowledge of precautions;Edema    Rehab Potential Good    Clinical Decision Making Limited treatment options, no task modification necessary    Comorbidities Affecting Occupational Performance: May have comorbidities impacting occupational performance    Modification or Assistance to Complete Evaluation  No modification of tasks or assist necessary to complete eval    OT Frequency 2x /  week    OT Duration 8 weeks    OT Treatment/Interventions Self-care/ADL training;Paraffin;Moist Heat;Fluidtherapy;Contrast Bath;Therapeutic exercise;Manual Therapy;Patient/family education;Passive range of motion;Scar mobilization;DME and/or AE instruction    Consulted and Agree with Plan of Care Patient             Patient will benefit from skilled therapeutic intervention in order  to improve the following deficits and impairments:   Body Structure / Function / Physical Skills: ADL, Strength, Pain, UE functional use, ROM, IADL, Scar mobility, Sensation, Decreased knowledge of precautions, Edema       Visit Diagnosis: Pain in right wrist  Stiffness of right wrist, not elsewhere classified  Pain in right hand  Muscle weakness (generalized)  Scar condition and fibrosis of skin    Problem List Patient Active Problem List   Diagnosis Date Noted   Cubital tunnel syndrome, right 08/18/2021   Bilateral thumb pain 04/03/2021   Adrenal insufficiency ([place]) 01/26/2021   Asthma 01/26/2021   GERD (gastroesophageal reflux disease) 01/26/2021   Tobacco use 01/26/2021   CAP (community acquired pneumonia) 01/26/2021   Carpal tunnel syndrome, right 08/19/2020   Tear of left acetabular labrum 05/06/2015   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, [place], [address] Phone: [phone]   Fax:  [phone]  Name: Serenah Mill MRN: [id number] Date of Birth: [birth year]

## 2021-10-19 ENCOUNTER — Ambulatory Visit: Payer: BC Managed Care – PPO | Admitting: Occupational Therapy

## 2021-10-19 DIAGNOSIS — M25521 Pain in right elbow: Secondary | ICD-10-CM

## 2021-10-19 DIAGNOSIS — M25531 Pain in right wrist: Secondary | ICD-10-CM

## 2021-10-19 DIAGNOSIS — M79641 Pain in right hand: Secondary | ICD-10-CM

## 2021-10-19 DIAGNOSIS — L905 Scar conditions and fibrosis of skin: Secondary | ICD-10-CM

## 2021-10-19 DIAGNOSIS — M25631 Stiffness of right wrist, not elsewhere classified: Secondary | ICD-10-CM

## 2021-10-19 DIAGNOSIS — M25621 Stiffness of right elbow, not elsewhere classified: Secondary | ICD-10-CM

## 2021-10-19 DIAGNOSIS — M6281 Muscle weakness (generalized): Secondary | ICD-10-CM

## 2021-10-19 NOTE — Therapy (Signed)
[facility] [address] [place], [address] Phone: [phone]   Fax:  [phone]  Occupational Therapy Treatment  Patient Details  Name: Margaret Gates MRN: [id number] Date of Birth: 1980-12-08 Referring Provider (OT): Poggi/Mchgee   Encounter Date: 10/19/2021   OT End of Session - 10/19/21 0949     Visit Number 9    Number of Visits 16    Date for OT Re-Evaluation 11/14/21    OT Start Time 0949    OT Stop Time 1031    OT Time Calculation (min) 42 min    Activity Tolerance Patient tolerated treatment well    Behavior During Therapy [facility] for tasks assessed/performed             Past Medical History:  Diagnosis Date   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[place])    Asthma    Bronchitis    GERD (gastroesophageal reflux disease)    Headache    Severe sepsis ([place]) 01/26/2021   Tobacco use     Past Surgical History:  Procedure Laterality Date   CARPAL TUNNEL RELEASE Right 08/31/2020   Procedure: CARPAL TUNNEL RELEASE ENDOSCOPIC;  Surgeon: Corky Mull, MD;  Location: [facility] ORS;  Service: Orthopedics;  Laterality: Right;   CARPAL TUNNEL RELEASE Left 10/26/2020   Procedure: CARPAL TUNNEL RELEASE ENDOSCOPIC;  Surgeon: Corky Mull, MD;  Location: [facility] ORS;  Service: Orthopedics;  Laterality: Left;   CARPAL TUNNEL RELEASE Right 08/22/2021   Procedure: OPEN REVISION RIGHT CARPAL TUNNEL RELEASE;  Surgeon: Corky Mull, MD;  Location: [facility] ORS;  Service: Orthopedics;  Laterality: Right;   CESAREAN SECTION     HIP SURGERY Bilateral    LAPROSCOPIC BONE SPURS   ULNAR NERVE TRANSPOSITION Right 08/22/2021   Procedure: SUBCUTANEOUS TRANSPOSITION OF THE ULNAR NERVE AT RIGHT ELBOW.;  Surgeon: Corky Mull, MD;  Location: [facility] ORS;  Service: Orthopedics;  Laterality: Right;    There were no vitals filed for this visit.   Subjective Assessment - 10/19/21 0949     Subjective  Was little sore after last time - drove a  lot yesterday and elbow bothered me and did some heat to elbow    Pertinent History Pt follow up with ortho on 09/01/21  for her first postop appointment following a open right carpal tunnel release in addition to subcutaneous anterior transposition of the ulnar nerve at the right elbow. Surgery was performed by Dr. Roland Rack on 08/22/2021. Overall the patient feels that she is doing well, she does report moderate swelling in the right hand however this does improve when she is able to elevate the hand and apply ice. She states that her numbness and tingling has improved to the right upper extremity after surgery. She denies any falls or trauma affecting the right upper extremity since surgery, she denies any signs of infection at home such as fevers or chills. The patient has been out of work since her date of surgery. The patient is taking occasional oxycodone in addition to ibuprofen and Tylenol. The patient denies any catching or locking symptoms in either the right wrist or elbow at this time. 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    Currently in Pain? No/denies  OT Treatments/Exercises (OP) - 10/19/21 0001       RUE Paraffin   Number Minutes Paraffin 8 Minutes    RUE Paraffin Location Wrist   hand   Comments prior to scar tissue nad ROM             With moist heat to elbow and forearm      manual therapy for emphasis on scar massage both elbow and wrist.  Most focus at wrist. Pt has some tenderness and hypersensitivity along scar line.  She is cont desensitization techniques over scar.  Utilized a small piece of silicone scar pad over scar to decrease sensitivity and improve toleration of scar massage- manually , mini massager - done and review using piece of coban for scar mobs - with great success Cont desensitization on both scars  -    including clapping hands cica scarpad provided for wrist night time    Therapeutic Exercise:   Pt performing tendon glides  COnt with table slide gentle -tolerate well on towel 20 reps  cont with Ulnar nerve glide for 5 reps only  and Med N glide - 5 reps      2 lbs for wrist in all planes painfree 12-15 reps And elbow flexion to 90 and shoulder punches 15 reps  2 x day 2-3 sets- per pt pain free    Biodex for scapular squeezes at 15 pounds 2 x 15 reps Chest press at 10 pounds kept 2 sets of 15-little tenderness Pull downs and Elbow extention 10 lbs 15 reps   1 kg ball on wall extended elbow on wall 90 shoulder flexion 2x 1 min  And throw with R catch bil on rebounder - 2x 1 min          NuStep done at resistance=1 - pain free- 5 min           OT Education - 10/19/21 0949     Education Details home exercise program, positioning    Person(s) Educated Patient    Methods Explanation;Demonstration;Tactile cues;Verbal cues;Handout    Comprehension Verbal cues required;Returned demonstration;Verbalized understanding              OT Short Term Goals - 09/19/21 1135       OT SHORT TERM GOAL #1   Title Patient to be independent in home program to decrease scar tissue and pain with increased motion and wrist and elbow flexion extension.    Baseline Patient reports pain 6-8/10 increasing with use and by the end of the day.  Scar tissue still thick and adhere.  As well as hypersensitivity.  Decreased flexion extension at wrist and elbow.    Time 3    Period Weeks    Status New    Target Date 10/10/21               OT Long Term Goals - 09/19/21 1136       OT LONG TERM GOAL #1   Title Hypersensitivity over both scars improved for patient to tolerate different textures, clothing and using tools without increase symptoms.    Baseline Patient sensitive to soft massage, scar massage and soft textures.  Patient to initiate that as well as a rougher textures like  a towel for drying.    Time 4    Period Weeks    Status New    Target Date 10/17/21      OT LONG TERM GOAL #2   Title Right elbow and wrist active range  of motion increased to within normal limits for patient to be able to don and doff overhead sweater and bra with no increase symptoms.    Baseline Discomfort and pain increase 6-8/10 with use of right arm per patient.  Right elbow extension -14, flexion 135 with discomfort.  Right wrist extension 20 and flexion 50.    Time 4    Period Weeks    Status New    Target Date 10/17/21      OT LONG TERM GOAL #3   Title Right elbow and wrist strength improved for patient to be able to push and pull door as well as turn doorknob carrying left pots and pains while cooking with no increase symptoms    Baseline Patient 4 weeks postop no strengthening yet.  Patient not using right arm and any resisted pulling and pushing or lifting    Time 8    Period Weeks    Status New    Target Date 11/14/21                   Plan - 10/19/21 0949     Clinical Impression Statement Patient presented OT evaluation with a diagnosis of right dominant hand cubital tunnel release as well as open carpal tunnel release on 08/22/2021.  Patient is 8 1/2 weeks postop. Pt making great progress in scar tissue adhesions and hyper sensitivity in CTS - still some tenderness and pain distally and middle -as well as elbow tender at around med epicondyle- pt report stiffness  getting better- only pain when hitting scar or drove a lot yesterday.  Pt doing well with 2 lbs  for wrist in all planes and elbow- at home.  Pt to cont with scar massage and tendon glides - with wrist extention table slides prior to 2 lbs weight. Pt continues to show increase AROM in R UE and decrease pain.  Able to increase weight and strengthening on Biodex at 10 and 15 pounds today.  Patient can benefit from skilled OT services for decrease edema and pain and increased motion and strength in right  dominant arm to return to prior level of function..    OT Occupational Profile and History Problem Focused Assessment - Including review of records relating to presenting problem    Occupational performance deficits (Please refer to evaluation for details): ADL's;IADL's;Rest and Sleep;Work;Play;Leisure;Social Participation    Body Structure / Function / Physical Skills ADL;Strength;Pain;UE functional use;ROM;IADL;Scar mobility;Sensation;Decreased knowledge of precautions;Edema    Rehab Potential Good    Clinical Decision Making Limited treatment options, no task modification necessary    Comorbidities Affecting Occupational Performance: May have comorbidities impacting occupational performance    Modification or Assistance to Complete Evaluation  No modification of tasks or assist necessary to complete eval    OT Frequency 2x / week    OT Duration 8 weeks    OT Treatment/Interventions Self-care/ADL training;Paraffin;Moist Heat;Fluidtherapy;Contrast Bath;Therapeutic exercise;Manual Therapy;Patient/family education;Passive range of motion;Scar mobilization;DME and/or AE instruction    Consulted and Agree with Plan of Care Patient             Patient will benefit from skilled therapeutic intervention in order to improve the following deficits and impairments:   Body Structure / Function / Physical Skills: ADL, Strength, Pain, UE functional use, ROM, IADL, Scar mobility, Sensation, Decreased knowledge of precautions, Edema       Visit Diagnosis: Pain in right wrist  Stiffness of right wrist, not elsewhere classified  Pain in right hand  Muscle  weakness (generalized)  Scar condition and fibrosis of skin  Pain in right elbow  Stiffness of right elbow, not elsewhere classified    Problem List Patient Active Problem List   Diagnosis Date Noted   Cubital tunnel syndrome, right 08/18/2021   Bilateral thumb pain 04/03/2021   Adrenal insufficiency ([place]) 01/26/2021   Asthma  01/26/2021   GERD (gastroesophageal reflux disease) 01/26/2021   Tobacco use 01/26/2021   CAP (community acquired pneumonia) 01/26/2021   Carpal tunnel syndrome, right 08/19/2020   Tear of left acetabular labrum 05/06/2015   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, [place], [address] Phone: [phone]   Fax:  [phone]  Name: Margaret Gates MRN: [id number] Date of Birth: [birth year]

## 2021-10-26 ENCOUNTER — Other Ambulatory Visit: Payer: Self-pay | Admitting: Surgery

## 2021-10-27 ENCOUNTER — Ambulatory Visit: Payer: BC Managed Care – PPO | Admitting: Occupational Therapy

## 2021-11-07 ENCOUNTER — Encounter: Payer: Self-pay | Admitting: Surgery

## 2021-11-07 ENCOUNTER — Encounter
Admission: RE | Admit: 2021-11-07 | Discharge: 2021-11-07 | Disposition: A | Discharge status: Home / Self Care | Payer: BC Managed Care – PPO | Source: Ambulatory Visit | Attending: Surgery | Admitting: Surgery

## 2021-11-07 VITALS — Ht 66.0 in | Wt 187.0 lb

## 2021-11-07 DIAGNOSIS — Z01812 Encounter for preprocedural laboratory examination: Secondary | ICD-10-CM

## 2021-11-07 NOTE — Progress Notes (Signed)
  Perioperative Services Pre-Admission/Anesthesia Testing    Date: 11/07/21  Name: Margaret Gates MRN:   [id number]  Re: Stress dose steroids  Planned Surgical Procedure(s):    Case: [id number] Date/Time: 11/15/21 1253   Procedures:      Revision open left carpal tunnel release with subcutaneous transposition of ulnar nerve at left elbow (Left)     ULNAR NERVE DECOMPRESSION/TRANSPOSITION (Left)   Anesthesia type: Choice   Pre-op diagnosis:      Carpal tunnel syndrome, left G56.02     Cubital tunnel syndrome, left G56.22   Location: [facility] OR ROOM 02 / [place] [facility]   Surgeons: Corky Mull, MD   Clinical Notes:  Patient is scheduled for the above procedure on 11/15/2021 with Dr. Milagros Evener, MD. 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  She is under the care of endocrinology at [facility].  In regards to her upcoming procedure, endocrinology has recommended following:  100 mg of IV hydrocortisone at the time of the incision, then 50 mg IV every 8 hours until they can take PO medications.   Postop day 1---> 50 mg of hydrocortisone twice a day  Postop day 2---> 25 mg of hydrocortisone twice a day  Postop day 3---> can switch to sick day dosing for 2 to 3 days   When patient reaches baseline activity then resume home dose   Information above being entered for communication to [place] teams on the day of patient's procedure.   Honor Loh, MSN, APRN, FNP-C, CEN [facility]  Peri-operative Services Nurse Practitioner Phone: [phone] 11/07/21 10:06 AM  NOTE: This note has been prepared using Dragon dictation software. Despite my best ability to proofread, there is always the potential that unintentional transcriptional errors may still occur from this process.

## 2021-11-07 NOTE — Patient Instructions (Addendum)
Your procedure is scheduled on: Wednesday November 15, 2021. Report to Day Surgery inside [facility] 2nd floor, stop by registration desk before getting on elevator.  To find out your arrival time please call [phone] between 1PM - 3PM on Tuesday November 14, 2021.  Remember: Instructions that are not followed completely may result in serious medical risk,  up to and including death, or upon the discretion of your surgeon and anesthesiologist your  surgery may need to be rescheduled.     _X__ 1. Do not eat food after midnight the night before your procedure.                 No chewing gum or hard candies. You may drink clear liquids up to 2 hours                 before you are scheduled to arrive for your surgery- DO not drink clear                 liquids within 2 hours of the start of your surgery.                 Clear Liquids include:  water, apple juice without pulp, clear Gatorade, G2 or                  Gatorade Zero (avoid Red/Purple/Blue), Black Coffee or Tea (Do not add                 anything to coffee or tea).  __X__2.   Complete the "Ensure Clear Pre-surgery Clear Carbohydrate Drink" provided to you, 2 hours before arrival. **If you       are diabetic you will be provided with an alternative drink, Gatorade Zero or G2.  __X__3.  On the morning of surgery brush your teeth with toothpaste and water, you                may rinse your mouth with mouthwash if you wish.  Do not swallow any toothpaste of mouthwash.     _X__ 4.  No Alcohol for 24 hours before or after surgery.   _X__ 5.  Do Not Smoke or use e-cigarettes For 24 Hours Prior to Your Surgery.                 Do not use any chewable tobacco products for at least 6 hours prior to                 Surgery.  _X__  6.  Do not use any recreational drugs (marijuana, cocaine, heroin, ecstasy, MDMA or other)                For at least one week prior to your surgery.  Combination of these drugs with  anesthesia                May have life threatening results.  ____  7.  Bring all medications with you on the day of surgery if instructed.   __X__8.  Notify your doctor if there is any change in your medical condition      (cold, fever, infections).     Do not wear jewelry, make-up, hairpins, clips or nail polish. Do not wear lotions, powders, or perfumes. You may wear deodorant. Do not shave 48 hours prior to surgery. Men may shave face and neck. Do not bring valuables to the hospital.    [facility] is not responsible for  any belongings or valuables.  Contacts, dentures or bridgework may not be worn into surgery. Leave your suitcase in the car. After surgery it may be brought to your room. For patients admitted to the hospital, discharge time is determined by your treatment team.   Patients discharged the day of surgery will not be allowed to drive home.   Make arrangements for someone to be with you for the first 24 hours of your Same Day Discharge.   __X__ Take these medicines the morning of surgery with A SIP OF WATER:    1. fludrocortisone (FLORINEF) 0.1 MG   2. hydrocortisone (CORTEF) 10 MG   3. omeprazole (PRILOSEC OTC) 20 MG   4.  5.  6.  ____ Fleet Enema (as directed)   __X__ Use CHG Soap (or wipes) as directed  ____ Use Benzoyl Peroxide Gel as instructed  __X__ Use inhalers on the day of surgery  albuterol (VENTOLIN HFA) 108 (90 Base) MCG/ACT inhaler   ____ Stop metformin 2 days prior to surgery    ____ Take 1/2 of usual insulin dose the night before surgery. No insulin the morning          of surgery.   ____ Call your PCP, cardiologist, or Pulmonologist if taking Coumadin/Plavix/aspirin and ask when to stop before your surgery.   __X__ One Week prior to surgery- Stop Anti-inflammatories such as Ibuprofen, Aleve, Advil, Motrin, meloxicam (MOBIC), diclofenac, etodolac, ketorolac, Toradol, Daypro, piroxicam, Goody's or BC powders. OK TO USE TYLENOL IF  NEEDED   __X__ Stop supplements until after surgery.    ____ Bring C-Pap to the hospital.    If you have any questions regarding your pre-procedure instructions,  Please call Pre-admit Testing at [phone]

## 2021-11-09 ENCOUNTER — Encounter: Payer: Self-pay | Admitting: Occupational Therapy

## 2021-11-09 ENCOUNTER — Ambulatory Visit: Payer: BC Managed Care – PPO | Attending: Surgery | Admitting: Occupational Therapy

## 2021-11-09 DIAGNOSIS — M6281 Muscle weakness (generalized): Secondary | ICD-10-CM | POA: Insufficient documentation

## 2021-11-09 DIAGNOSIS — M79641 Pain in right hand: Secondary | ICD-10-CM | POA: Insufficient documentation

## 2021-11-09 DIAGNOSIS — M25631 Stiffness of right wrist, not elsewhere classified: Secondary | ICD-10-CM | POA: Diagnosis present

## 2021-11-09 DIAGNOSIS — M25521 Pain in right elbow: Secondary | ICD-10-CM | POA: Diagnosis present

## 2021-11-09 DIAGNOSIS — M25621 Stiffness of right elbow, not elsewhere classified: Secondary | ICD-10-CM | POA: Diagnosis present

## 2021-11-09 DIAGNOSIS — L905 Scar conditions and fibrosis of skin: Secondary | ICD-10-CM | POA: Diagnosis present

## 2021-11-09 DIAGNOSIS — M25531 Pain in right wrist: Secondary | ICD-10-CM | POA: Diagnosis present

## 2021-11-09 NOTE — Therapy (Signed)
[facility] [address], [place], [address] Phone: [phone]   Fax:  [phone]  Occupational Therapy Treatment/Progress Update Reporting period from 09/19/2021 to 11/09/2021  Patient Details  Name: Margaret Gates MRN: [id number] Date of Birth: 07-11-1980 Referring Provider (OT): Poggi/Mchgee   Encounter Date: 11/09/2021   OT End of Session - 11/09/21 2234     Visit Number 10    Number of Visits 16    Date for OT Re-Evaluation 11/14/21    OT Start Time 0900    OT Stop Time 0945    OT Time Calculation (min) 45 min    Activity Tolerance Patient tolerated treatment well    Behavior During Therapy [facility] for tasks assessed/performed             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 ([place])    Asthma    Bronchitis    GERD (gastroesophageal reflux disease)    Headache    Severe sepsis ([place]) 01/26/2021   Tobacco use     Past Surgical History:  Procedure Laterality Date   CARPAL TUNNEL RELEASE Right 08/31/2020   Procedure: CARPAL TUNNEL RELEASE ENDOSCOPIC;  Surgeon: Corky Mull, MD;  Location: [facility] ORS;  Service: Orthopedics;  Laterality: Right;   CARPAL TUNNEL RELEASE Left 10/26/2020   Procedure: CARPAL TUNNEL RELEASE ENDOSCOPIC;  Surgeon: Corky Mull, MD;  Location: [facility] ORS;  Service: Orthopedics;  Laterality: Left;   CARPAL TUNNEL RELEASE Right 08/22/2021   Procedure: OPEN REVISION RIGHT CARPAL TUNNEL RELEASE;  Surgeon: Corky Mull, MD;  Location: [facility] ORS;  Service: Orthopedics;  Laterality: Right;   CESAREAN SECTION     HIP SURGERY Bilateral    LAPROSCOPIC BONE SPURS   HIP SURGERY Bilateral    ULNAR NERVE TRANSPOSITION Right 08/22/2021   Procedure: SUBCUTANEOUS TRANSPOSITION OF THE ULNAR NERVE AT RIGHT ELBOW.;  Surgeon: Corky Mull, MD;  Location: [facility] ORS;  Service: Orthopedics;  Laterality: Right;   UPPER GI ENDOSCOPY  2020     There were no vitals filed for this visit.   Subjective Assessment - 11/09/21 2229     Subjective  Pt reports she is doing well, she has a lot of appts over the next few days and will be having surgery next week on her left elbow and carpal tunnel.  She continues to report decreased sensation on the right along her scarline at her elbow.  Today has pain in right UE 4/10, wrist bothering her in the couple days, pain can shoot into palm.  Not sure if she picked up something wrong.    Pertinent History Pt follow up with ortho on 09/01/21  for her first postop appointment following a open right carpal tunnel release in addition to subcutaneous anterior transposition of the ulnar nerve at the right elbow. Surgery was performed by Dr. Roland Rack on 08/22/2021. Overall the patient feels that she is doing well, she does report moderate swelling in the right hand however this does improve when she is able to elevate the hand and apply ice. She states that her numbness and tingling has improved to the right upper extremity after surgery. She denies any falls or trauma affecting the right upper extremity since surgery, she denies any signs of infection at home such as fevers or chills. The patient has been out of work since her date of surgery. The patient is taking occasional oxycodone in addition to  ibuprofen and Tylenol. The patient denies any catching or locking symptoms in either the right wrist or elbow at this time. 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    Currently in Pain? Yes    Pain Score 4     Pain Location Wrist    Pain Orientation Right    Pain Descriptors / Indicators Tightness;Shooting    Pain Type Surgical pain    Pain Onset More than a month ago    Pain Frequency Intermittent                [facility] OT Assessment - 11/09/21 2243       AROM   Right Elbow Flexion 141     Right Elbow Extension 0    Right Wrist Extension 70 Degrees    Right Wrist Flexion 78 Degrees    Right Wrist Radial Deviation 20 Degrees    Right Wrist Ulnar Deviation 32 Degrees      Strength   Right Hand Grip (lbs) 49    Right Hand Lateral Pinch 16 lbs    Right Hand 3 Point Pinch 11 lbs    Left Hand Grip (lbs) 85    Left Hand Lateral Pinch 18 lbs    Left Hand 3 Point Pinch 16 lbs            Pt with full fisting on right hand, full opposition Pt reports being able to wear a jacket but has to make sure it falls either above the elbow or on forearm, cannot tolerate anything at the elbow.    Pt seen this date for progress update with reassessment of RUE as outlined above in flowsheet as well as comparison to left since patient is having surgery next week on left UE.    Pt with some pain this date 4/10 in right UE, feels she may have used it for lifting something too heavy.  Discussed used of local store pick up services to receive help with heavier grocery items such as a case of water, drinks.  Pt instructed on continued desensitization techniques for right UE, issued foam for rolling over elbow and wrist as well as massage.   Pt to continue with scar massage at home Review of current exercises for home program.    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    Pt will follow up one more session prior to surgery on LUE next week.   Issued foam for rolling under wrist and elbow along scar to decrease sensitivity, scar massage and improve tissue mobility.          OT Treatments/Exercises (OP) - 11/09/21 2241       RUE Paraffin   Number Minutes Paraffin 8 Minutes    RUE Paraffin Location Wrist    Comments prior to scar and tissue ROM                    OT Education - 11/09/21 2233     Education Details home exercise program, positioning, progress, goals, POC    Person(s)  Educated Patient    Methods Explanation;Demonstration;Tactile cues;Verbal cues;Handout    Comprehension Verbal cues required;Returned demonstration;Verbalized understanding  OT Short Term Goals - 11/09/21 2237       OT SHORT TERM GOAL #1   Title Patient to be independent in home program to decrease scar tissue and pain with increased motion and wrist and elbow flexion extension.    Baseline Patient reports pain 6-8/10 increasing with use and by the end of the day.  Scar tissue still thick and adhere.  As well as hypersensitivity.  Decreased flexion extension at wrist and elbow.    Time 3    Period Weeks    Status On-going    Target Date 11/14/21               OT Long Term Goals - 11/09/21 2237       OT LONG TERM GOAL #1   Title Hypersensitivity over both scars improved for patient to tolerate different textures, clothing and using tools without increase symptoms.    Baseline Patient sensitive to soft massage, scar massage and soft textures.  Patient to initiate that as well as a rougher textures like a towel for drying.  11/09/2021: continues to have some decreased sensation and sensitivity along scar line of right UE, continues to work on desensitization techniques.    Time 4    Period Weeks    Status On-going    Target Date 11/14/21      OT LONG TERM GOAL #2   Title Right elbow and wrist active range of motion increased to within normal limits for patient to be able to don and doff overhead sweater and bra with no increase symptoms.    Baseline Discomfort and pain increase 6-8/10 with use of right arm per patient.  Right elbow extension -14, flexion 135 with discomfort.  Right wrist extension 20 and flexion 50.    Time 4    Period Weeks    Status Achieved    Target Date 10/17/21      OT LONG TERM GOAL #3   Title Right elbow and wrist strength improved for patient to be able to push and pull door as well as turn doorknob carrying left pots and pains while  cooking with no increase symptoms    Baseline Patient 4 weeks postop no strengthening yet.  Patient not using right arm and any resisted pulling and pushing or lifting.  11/09/2021:  able to open doors but still has difficulty lifting filled pots.    Time 8    Period Weeks    Status Partially Met    Target Date 11/14/21                   Plan - 11/09/21 2234     Clinical Impression Statement Patient presented OT evaluation with a diagnosis of right dominant hand cubital tunnel release as well as open carpal tunnel release on 08/22/2021.  Patient is 11 weeks postop. Pt making great progress in scar tissue adhesions and hyper sensitivity in CTS - still some tenderness and pain distally and middle -as well as elbow tender at around med epicondyle- pt report stiffness  getting better.  Pt doing well with 2 lbs  for wrist in all planes and elbow- at home.  Pt to cont with scar massage and tendon glides - with wrist extension table slides prior to 2 lbs weight.  Pt with some increased pain this date and reports she may have lifted or picked up something that may have aggravated her right arm.  She is scheduled to have surgery on her left  UE next week.  She continues to have increased sensitivity along scar line on right UE, she has continued with exercises and home program in recent weeks.  Measurements and strength reassessed this date with minimal changes but may have been affected by pain this date.  Patient can benefit from skilled OT services for decrease edema and pain and increased motion and strength in right dominant arm to return to prior level of function..    OT Occupational Profile and History Problem Focused Assessment - Including review of records relating to presenting problem    Occupational performance deficits (Please refer to evaluation for details): ADL's;IADL's;Rest and Sleep;Work;Play;Leisure;Social Participation    Body Structure / Function / Physical Skills  ADL;Strength;Pain;UE functional use;ROM;IADL;Scar mobility;Sensation;Decreased knowledge of precautions;Edema    Rehab Potential Good    Clinical Decision Making Limited treatment options, no task modification necessary    Comorbidities Affecting Occupational Performance: May have comorbidities impacting occupational performance    Modification or Assistance to Complete Evaluation  No modification of tasks or assist necessary to complete eval    OT Frequency 2x / week    OT Duration 8 weeks    OT Treatment/Interventions Self-care/ADL training;Paraffin;Moist Heat;Fluidtherapy;Contrast Bath;Therapeutic exercise;Manual Therapy;Patient/family education;Passive range of motion;Scar mobilization;DME and/or AE instruction    Consulted and Agree with Plan of Care Patient             Patient will benefit from skilled therapeutic intervention in order to improve the following deficits and impairments:   Body Structure / Function / Physical Skills: ADL, Strength, Pain, UE functional use, ROM, IADL, Scar mobility, Sensation, Decreased knowledge of precautions, Edema       Visit Diagnosis: Muscle weakness (generalized)  Scar condition and fibrosis of skin  Pain in right wrist  Stiffness of right wrist, not elsewhere classified  Pain in right hand  Pain in right elbow  Stiffness of right elbow, not elsewhere classified    Problem List Patient Active Problem List   Diagnosis Date Noted   Cubital tunnel syndrome, right 08/18/2021   Bilateral thumb pain 04/03/2021   Adrenal insufficiency ([place]) 01/26/2021   Asthma 01/26/2021   GERD (gastroesophageal reflux disease) 01/26/2021   Tobacco use 01/26/2021   CAP (community acquired pneumonia) 01/26/2021   Carpal tunnel syndrome, right 08/19/2020   Tear of left acetabular labrum 05/06/2015   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 [place], [address] Phone: [phone]   Fax:  [phone]  Name: Margaret Gates MRN: [id number] Date of Birth: 1980-01-21

## 2021-11-13 ENCOUNTER — Ambulatory Visit: Payer: BC Managed Care – PPO | Admitting: Occupational Therapy

## 2021-11-13 DIAGNOSIS — M6281 Muscle weakness (generalized): Secondary | ICD-10-CM | POA: Diagnosis not present

## 2021-11-13 DIAGNOSIS — M25531 Pain in right wrist: Secondary | ICD-10-CM

## 2021-11-13 DIAGNOSIS — L905 Scar conditions and fibrosis of skin: Secondary | ICD-10-CM

## 2021-11-13 DIAGNOSIS — M79641 Pain in right hand: Secondary | ICD-10-CM

## 2021-11-13 DIAGNOSIS — M25631 Stiffness of right wrist, not elsewhere classified: Secondary | ICD-10-CM

## 2021-11-13 NOTE — Therapy (Signed)
[facility] [address], [place], [address] Phone: [phone]   Fax:  [phone]  Occupational Therapy Treatment  Patient Details  Name: Margaret Gates MRN: [id number] Date of Birth: [birth year] Referring Provider (OT): Poggi/Mchgee   Encounter Date: 11/13/2021   OT End of Session - 11/13/21 1059     Visit Number 11    Number of Visits 16    Date for OT Re-Evaluation 11/14/21    OT Start Time 0903    OT Stop Time 0947    OT Time Calculation (min) 44 min    Activity Tolerance Patient tolerated treatment well    Behavior During Therapy [facility] for tasks assessed/performed             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 ([place])    Asthma    Bronchitis    GERD (gastroesophageal reflux disease)    Headache    Severe sepsis ([place]) 01/26/2021   Tobacco use     Past Surgical History:  Procedure Laterality Date   CARPAL TUNNEL RELEASE Right 08/31/2020   Procedure: CARPAL TUNNEL RELEASE ENDOSCOPIC;  Surgeon: Corky Mull, MD;  Location: [facility] ORS;  Service: Orthopedics;  Laterality: Right;   CARPAL TUNNEL RELEASE Left 10/26/2020   Procedure: CARPAL TUNNEL RELEASE ENDOSCOPIC;  Surgeon: Corky Mull, MD;  Location: [facility] ORS;  Service: Orthopedics;  Laterality: Left;   CARPAL TUNNEL RELEASE Right 08/22/2021   Procedure: OPEN REVISION RIGHT CARPAL TUNNEL RELEASE;  Surgeon: Corky Mull, MD;  Location: [facility] ORS;  Service: Orthopedics;  Laterality: Right;   CESAREAN SECTION     HIP SURGERY Bilateral    LAPROSCOPIC BONE SPURS   HIP SURGERY Bilateral    ULNAR NERVE TRANSPOSITION Right 08/22/2021   Procedure: SUBCUTANEOUS TRANSPOSITION OF THE ULNAR NERVE AT RIGHT ELBOW.;  Surgeon: Corky Mull, MD;  Location: [facility] ORS;  Service: Orthopedics;  Laterality: Right;   UPPER GI ENDOSCOPY  2020    There were no vitals filed for this visit.   Subjective  Assessment - 11/13/21 1049     Subjective  When I came last week and I had some pain in my hand but I think it was because I picked up something heavy at the grocery store.    Pertinent History Pt follow up with ortho on 09/01/21  for her first postop appointment following a open right carpal tunnel release in addition to subcutaneous anterior transposition of the ulnar nerve at the right elbow. Surgery was performed by Dr. Roland Rack on 08/22/2021. Overall the patient feels that she is doing well, she does report moderate swelling in the right hand however this does improve when she is able to elevate the hand and apply ice. She states that her numbness and tingling has improved to the right upper extremity after surgery. She denies any falls or trauma affecting the right upper extremity since surgery, she denies any signs of infection at home such as fevers or chills. The patient has been out of work since her date of surgery. The patient is taking occasional oxycodone in addition to ibuprofen and Tylenol. The patient denies any catching or locking symptoms in either the right wrist or elbow at this time. 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    Currently in Pain? No/denies                [facility] OT Assessment - 11/13/21 0001       Strength   Right Hand Grip (lbs) 70    Right Hand Lateral Pinch 16 lbs    Right Hand 3 Point Pinch 12 lbs    Left Hand Grip (lbs) 85    Left Hand Lateral Pinch 18 lbs    Left Hand 3 Point Pinch 16 lbs              Patient returned this date with less pain compared to last week and right hand.  Patient grip increased greatly -prehension strength about the same.        OT Treatments/Exercises (OP) - 11/13/21 0001       RUE Paraffin   Number Minutes Paraffin 8 Minutes    RUE Paraffin Location Wrist   hand   Comments Prior to soft tissue  and scar massage.             Pt with full fisting on right hand, full opposition   Patient show active range of motion in the right wrist and elbow as well as digits within normal limits except some stiffness at the end range for wrist extension and flexion.  Patient to continue with some stretches in the morning after some heat.   Pt instructed on continued desensitization techniques for right UE, i continue foam for rolling over elbow and wrist as well as massage.   Pt to continue with scar massage at [facility] surgeon about if there is a stitch in the right elbow still present. Review of current exercises for home program.       Patient to continue with home program for R UE and can return to OT when surgeon refer pt post op L UE surgery     OT Education - 11/13/21 1059     Education Details home exercise program, positioning, progress, goals, POC    Person(s) Educated Patient    Methods Explanation;Demonstration;Tactile cues;Verbal cues;Handout    Comprehension Verbal cues required;Returned demonstration;Verbalized understanding              OT Short Term Goals - 11/09/21 2237       OT SHORT TERM GOAL #1   Title Patient to be independent in home program to decrease scar tissue and pain with increased motion and wrist and elbow flexion extension.    Baseline Patient reports pain 6-8/10 increasing with use and by the end of the day.  Scar tissue still thick and adhere.  As well as hypersensitivity.  Decreased flexion extension at wrist and elbow.    Time 3    Period Weeks    Status On-going    Target Date 11/14/21               OT Long Term Goals - 11/09/21 2237       OT LONG TERM GOAL #1   Title Hypersensitivity over both scars improved for patient to tolerate different textures, clothing and using tools without increase symptoms.    Baseline Patient sensitive to soft massage, scar massage and soft textures.  Patient to initiate that as well as a rougher  textures like a towel for drying.  11/09/2021: continues to have some decreased sensation and sensitivity along scar line of right UE, continues to work on desensitization techniques.    Time 4    Period  Weeks    Status On-going    Target Date 11/14/21      OT LONG TERM GOAL #2   Title Right elbow and wrist active range of motion increased to within normal limits for patient to be able to don and doff overhead sweater and bra with no increase symptoms.    Baseline Discomfort and pain increase 6-8/10 with use of right arm per patient.  Right elbow extension -14, flexion 135 with discomfort.  Right wrist extension 20 and flexion 50.    Time 4    Period Weeks    Status Achieved    Target Date 10/17/21      OT LONG TERM GOAL #3   Title Right elbow and wrist strength improved for patient to be able to push and pull door as well as turn doorknob carrying left pots and pains while cooking with no increase symptoms    Baseline Patient 4 weeks postop no strengthening yet.  Patient not using right arm and any resisted pulling and pushing or lifting.  11/09/2021:  able to open doors but still has difficulty lifting filled pots.    Time 8    Period Weeks    Status Partially Met    Target Date 11/14/21                   Plan - 11/13/21 1101     Clinical Impression Statement Patient presented OT evaluation with a diagnosis of right dominant hand cubital tunnel release as well as open carpal tunnel release on 08/22/2021.  Patient is 11 1/2 weeks postop. Pt made great progress in scar tissue adhesions and hyper sensitivity in CTS - still some tenderness at Cubital tunnel scar one area - pt report stiffness  getting better.  Pt doing well with 2 lbs  for wrist in all planes and elbow- at home.  Pt to cont with scar massage and tendon glides - with wrist extension table slides prior to 2 lbs weight.  Grip increase greatly compare to last week. She is scheduled to have surgery on her left UE next week.   Patient can benefit from skilled OT services to increase strength when return after next surgery to condition to return to work and prior level of function..    OT Occupational Profile and History Problem Focused Assessment - Including review of records relating to presenting problem    Occupational performance deficits (Please refer to evaluation for details): ADL's;IADL's;Rest and Sleep;Work;Play;Leisure;Social Participation    Body Structure / Function / Physical Skills ADL;Strength;Pain;UE functional use;ROM;IADL;Scar mobility;Sensation;Decreased knowledge of precautions;Edema    Rehab Potential Good    Clinical Decision Making Limited treatment options, no task modification necessary    Comorbidities Affecting Occupational Performance: May have comorbidities impacting occupational performance    Modification or Assistance to Complete Evaluation  No modification of tasks or assist necessary to complete eval    OT Frequency 1x / week    OT Duration 8 weeks    OT Treatment/Interventions Self-care/ADL training;Paraffin;Moist Heat;Fluidtherapy;Contrast Bath;Therapeutic exercise;Manual Therapy;Patient/family education;Passive range of motion;Scar mobilization;DME and/or AE instruction    Consulted and Agree with Plan of Care Patient             Patient will benefit from skilled therapeutic intervention in order to improve the following deficits and impairments:   Body Structure / Function / Physical Skills: ADL, Strength, Pain, UE functional use, ROM, IADL, Scar mobility, Sensation, Decreased knowledge of precautions, Edema       Visit  Diagnosis: Scar condition and fibrosis of skin  Pain in right hand  Stiffness of right wrist, not elsewhere classified  Pain in right wrist    Problem List Patient Active Problem List   Diagnosis Date Noted   Cubital tunnel syndrome, right 08/18/2021   Bilateral thumb pain 04/03/2021   Adrenal insufficiency ([place]) 01/26/2021   Asthma 01/26/2021    GERD (gastroesophageal reflux disease) 01/26/2021   Tobacco use 01/26/2021   CAP (community acquired pneumonia) 01/26/2021   Carpal tunnel syndrome, right 08/19/2020   Tear of left acetabular labrum 05/06/2015   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 [place], [address] Phone: [phone]   Fax:  [phone]  Name: Margaret Gates MRN: [id number] Date of Birth: 22-Feb-1980

## 2021-11-15 ENCOUNTER — Encounter
Admission: RE | Disposition: A | Discharge status: Home / Self Care | Payer: Self-pay | Source: Home / Self Care | Attending: Surgery

## 2021-11-15 ENCOUNTER — Other Ambulatory Visit: Payer: Self-pay

## 2021-11-15 ENCOUNTER — Ambulatory Visit: Payer: BC Managed Care – PPO | Admitting: Urgent Care

## 2021-11-15 ENCOUNTER — Encounter: Payer: Self-pay | Admitting: Surgery

## 2021-11-15 ENCOUNTER — Ambulatory Visit
Admission: RE | Admit: 2021-11-15 | Discharge: 2021-11-15 | Disposition: A | Discharge status: Home / Self Care | Payer: BC Managed Care – PPO | Attending: Surgery | Admitting: Surgery

## 2021-11-15 DIAGNOSIS — Z79899 Other long term (current) drug therapy: Secondary | ICD-10-CM | POA: Insufficient documentation

## 2021-11-15 DIAGNOSIS — G5602 Carpal tunnel syndrome, left upper limb: Secondary | ICD-10-CM | POA: Diagnosis present

## 2021-11-15 DIAGNOSIS — G709 Myoneural disorder, unspecified: Secondary | ICD-10-CM | POA: Diagnosis not present

## 2021-11-15 DIAGNOSIS — J45909 Unspecified asthma, uncomplicated: Secondary | ICD-10-CM | POA: Diagnosis not present

## 2021-11-15 DIAGNOSIS — K219 Gastro-esophageal reflux disease without esophagitis: Secondary | ICD-10-CM | POA: Diagnosis not present

## 2021-11-15 DIAGNOSIS — E2740 Unspecified adrenocortical insufficiency: Secondary | ICD-10-CM | POA: Diagnosis not present

## 2021-11-15 DIAGNOSIS — G5622 Lesion of ulnar nerve, left upper limb: Secondary | ICD-10-CM | POA: Insufficient documentation

## 2021-11-15 DIAGNOSIS — Z01812 Encounter for preprocedural laboratory examination: Secondary | ICD-10-CM

## 2021-11-15 HISTORY — PX: CARPAL TUNNEL RELEASE: SHX101

## 2021-11-15 HISTORY — DX: Drug-induced adrenocortical insufficiency: E27.3

## 2021-11-15 HISTORY — PX: ULNAR NERVE TRANSPOSITION: SHX2595

## 2021-11-15 LAB — POCT PREGNANCY, URINE: Preg Test, Ur: NEGATIVE

## 2021-11-15 SURGERY — CARPAL TUNNEL RELEASE
Anesthesia: General | Laterality: Left

## 2021-11-15 MED ORDER — SODIUM CHLORIDE 0.9 % IV SOLN: INTRAVENOUS | Status: DC

## 2021-11-15 MED ORDER — OXYCODONE HCL 5 MG/5ML PO SOLN: 5.0000 mg | Freq: Once | ORAL | Status: AC | PRN

## 2021-11-15 MED ORDER — DEXMEDETOMIDINE HCL IN NACL 80 MCG/20ML IV SOLN
INTRAVENOUS | Status: AC
  Filled 2021-11-15: qty 20

## 2021-11-15 MED ORDER — DEXAMETHASONE SODIUM PHOSPHATE 10 MG/ML IJ SOLN
INTRAMUSCULAR | Status: DC | PRN
  Administered 2021-11-15: 5 mg via INTRAVENOUS

## 2021-11-15 MED ORDER — CHLORHEXIDINE GLUCONATE 0.12 % MT SOLN
OROMUCOSAL | Status: AC
  Administered 2021-11-15: 15 mL via OROMUCOSAL
  Filled 2021-11-15: qty 15

## 2021-11-15 MED ORDER — ACETAMINOPHEN 10 MG/ML IV SOLN
INTRAVENOUS | Status: DC | PRN
  Administered 2021-11-15: 1000 mg via INTRAVENOUS

## 2021-11-15 MED ORDER — OXYCODONE HCL 5 MG PO TABS
5.0000 mg | ORAL_TABLET | Freq: Once | ORAL | Status: AC | PRN
  Administered 2021-11-15: 5 mg via ORAL

## 2021-11-15 MED ORDER — ONDANSETRON HCL 4 MG/2ML IJ SOLN: 4.0000 mg | Freq: Four times a day (QID) | INTRAMUSCULAR | Status: DC | PRN

## 2021-11-15 MED ORDER — ORAL CARE MOUTH RINSE: 15.0000 mL | Freq: Once | OROMUCOSAL | Status: AC

## 2021-11-15 MED ORDER — ACETAMINOPHEN 10 MG/ML IV SOLN
INTRAVENOUS | Status: AC
  Filled 2021-11-15: qty 100

## 2021-11-15 MED ORDER — ONDANSETRON HCL 4 MG/2ML IJ SOLN: 4.0000 mg | Freq: Once | INTRAMUSCULAR | Status: DC | PRN

## 2021-11-15 MED ORDER — FENTANYL CITRATE (PF) 100 MCG/2ML IJ SOLN
INTRAMUSCULAR | Status: DC | PRN
  Administered 2021-11-15 (×2): 50 ug via INTRAVENOUS

## 2021-11-15 MED ORDER — ONDANSETRON HCL 4 MG PO TABS: 4.0000 mg | ORAL_TABLET | Freq: Four times a day (QID) | ORAL | Status: DC | PRN

## 2021-11-15 MED ORDER — PROPOFOL 10 MG/ML IV BOLUS
INTRAVENOUS | Status: AC
  Filled 2021-11-15: qty 20

## 2021-11-15 MED ORDER — HYDROCORTISONE SOD SUC (PF) 100 MG IJ SOLR
INTRAMUSCULAR | Status: DC | PRN
  Administered 2021-11-15: 100 mg via INTRAVENOUS

## 2021-11-15 MED ORDER — ONDANSETRON HCL 4 MG/2ML IJ SOLN
INTRAMUSCULAR | Status: AC
  Filled 2021-11-15: qty 2

## 2021-11-15 MED ORDER — HYDROCORTISONE SOD SUC (PF) 100 MG IJ SOLR
INTRAMUSCULAR | Status: AC
  Filled 2021-11-15: qty 2

## 2021-11-15 MED ORDER — CEFAZOLIN SODIUM-DEXTROSE 2-4 GM/100ML-% IV SOLN
2.0000 g | INTRAVENOUS | Status: AC
  Administered 2021-11-15: 2 g via INTRAVENOUS

## 2021-11-15 MED ORDER — BUPIVACAINE HCL (PF) 0.5 % IJ SOLN
INTRAMUSCULAR | Status: AC
  Filled 2021-11-15: qty 30

## 2021-11-15 MED ORDER — DEXAMETHASONE SODIUM PHOSPHATE 10 MG/ML IJ SOLN
INTRAMUSCULAR | Status: AC
  Filled 2021-11-15: qty 1

## 2021-11-15 MED ORDER — KETOROLAC TROMETHAMINE 30 MG/ML IJ SOLN
30.0000 mg | Freq: Once | INTRAMUSCULAR | Status: AC
  Administered 2021-11-15: 30 mg via INTRAVENOUS

## 2021-11-15 MED ORDER — MIDAZOLAM HCL 2 MG/2ML IJ SOLN
INTRAMUSCULAR | Status: DC | PRN
  Administered 2021-11-15: 2 mg via INTRAVENOUS

## 2021-11-15 MED ORDER — ACETAMINOPHEN 10 MG/ML IV SOLN: 1000.0000 mg | Freq: Once | INTRAVENOUS | Status: DC | PRN

## 2021-11-15 MED ORDER — FENTANYL CITRATE (PF) 100 MCG/2ML IJ SOLN
25.0000 ug | INTRAMUSCULAR | Status: DC | PRN
  Administered 2021-11-15 (×2): 25 ug via INTRAVENOUS

## 2021-11-15 MED ORDER — CHLORHEXIDINE GLUCONATE 0.12 % MT SOLN: 15.0000 mL | Freq: Once | OROMUCOSAL | Status: AC

## 2021-11-15 MED ORDER — OXYCODONE HCL 5 MG PO TABS: 5.0000 mg | ORAL_TABLET | ORAL | Status: DC | PRN

## 2021-11-15 MED ORDER — FENTANYL CITRATE (PF) 100 MCG/2ML IJ SOLN
INTRAMUSCULAR | Status: AC
  Administered 2021-11-15: 50 ug via INTRAVENOUS
  Filled 2021-11-15: qty 2

## 2021-11-15 MED ORDER — METOCLOPRAMIDE HCL 10 MG PO TABS: 5.0000 mg | ORAL_TABLET | Freq: Three times a day (TID) | ORAL | Status: DC | PRN

## 2021-11-15 MED ORDER — OXYCODONE HCL 5 MG PO TABS
ORAL_TABLET | ORAL | Status: AC
  Filled 2021-11-15: qty 1

## 2021-11-15 MED ORDER — PROPOFOL 10 MG/ML IV BOLUS
INTRAVENOUS | Status: DC | PRN
  Administered 2021-11-15: 150 mg via INTRAVENOUS
  Administered 2021-11-15 (×2): 50 mg via INTRAVENOUS

## 2021-11-15 MED ORDER — LIDOCAINE HCL (CARDIAC) PF 100 MG/5ML IV SOSY
PREFILLED_SYRINGE | INTRAVENOUS | Status: DC | PRN
  Administered 2021-11-15: 100 mg via INTRAVENOUS

## 2021-11-15 MED ORDER — MIDAZOLAM HCL 2 MG/2ML IJ SOLN
INTRAMUSCULAR | Status: AC
  Filled 2021-11-15: qty 2

## 2021-11-15 MED ORDER — BUPIVACAINE HCL (PF) 0.5 % IJ SOLN
INTRAMUSCULAR | Status: DC | PRN
  Administered 2021-11-15: 20 mL

## 2021-11-15 MED ORDER — ONDANSETRON HCL 4 MG/2ML IJ SOLN
INTRAMUSCULAR | Status: DC | PRN
  Administered 2021-11-15: 4 mg via INTRAVENOUS

## 2021-11-15 MED ORDER — LIDOCAINE HCL (PF) 2 % IJ SOLN
INTRAMUSCULAR | Status: AC
  Filled 2021-11-15: qty 5

## 2021-11-15 MED ORDER — FENTANYL CITRATE (PF) 100 MCG/2ML IJ SOLN
INTRAMUSCULAR | Status: AC
  Filled 2021-11-15: qty 2

## 2021-11-15 MED ORDER — LACTATED RINGERS IV SOLN: INTRAVENOUS | Status: DC

## 2021-11-15 MED ORDER — KETOROLAC TROMETHAMINE 30 MG/ML IJ SOLN
INTRAMUSCULAR | Status: AC
  Filled 2021-11-15: qty 1

## 2021-11-15 MED ORDER — CEFAZOLIN SODIUM-DEXTROSE 2-4 GM/100ML-% IV SOLN
INTRAVENOUS | Status: AC
  Filled 2021-11-15: qty 100

## 2021-11-15 MED ORDER — METOCLOPRAMIDE HCL 5 MG/ML IJ SOLN: 5.0000 mg | Freq: Three times a day (TID) | INTRAMUSCULAR | Status: DC | PRN

## 2021-11-15 MED ORDER — 0.9 % SODIUM CHLORIDE (POUR BTL) OPTIME
TOPICAL | Status: DC | PRN
  Administered 2021-11-15: 500 mL

## 2021-11-15 MED ORDER — OXYCODONE HCL 5 MG PO TABS: 5.0000 mg | ORAL_TABLET | ORAL | 0 refills | Status: DC | PRN

## 2021-11-15 MED ORDER — DEXMEDETOMIDINE HCL IN NACL 200 MCG/50ML IV SOLN
INTRAVENOUS | Status: DC | PRN
  Administered 2021-11-15: 4 ug via INTRAVENOUS
  Administered 2021-11-15: 8 ug via INTRAVENOUS

## 2021-11-15 SURGICAL SUPPLY — 47 items
BNDG COHESIVE 4X5 TAN STRL LF (GAUZE/BANDAGES/DRESSINGS) ×1 IMPLANT
BNDG ELASTIC 2X5.8 VLCR STR LF (GAUZE/BANDAGES/DRESSINGS) ×1 IMPLANT
BNDG ELASTIC 3X5.8 VLCR STR LF (GAUZE/BANDAGES/DRESSINGS) ×1 IMPLANT
BNDG ELASTIC 4X5.8 VLCR NS LF (GAUZE/BANDAGES/DRESSINGS) ×1 IMPLANT
BNDG ESMARK 4X12 TAN STRL LF (GAUZE/BANDAGES/DRESSINGS) ×1 IMPLANT
CHLORAPREP W/TINT 26 (MISCELLANEOUS) ×2 IMPLANT
CORD BIP STRL DISP 12FT (MISCELLANEOUS) ×1 IMPLANT
CUFF TOURN SGL QUICK 18X4 (TOURNIQUET CUFF) IMPLANT
CUFF TOURN SGL QUICK 24 (TOURNIQUET CUFF)
CUFF TRNQT CYL 24X4X16.5-23 (TOURNIQUET CUFF) IMPLANT
DRAPE SURG 17X11 SM STRL (DRAPES) ×2 IMPLANT
ELECT REM PT RETURN 9FT ADLT (ELECTROSURGICAL) ×1
ELECTRODE REM PT RTRN 9FT ADLT (ELECTROSURGICAL) ×1 IMPLANT
FORCEPS JEWEL BIP 4-3/4 STR (INSTRUMENTS) ×1 IMPLANT
GAUZE SPONGE 4X4 12PLY STRL (GAUZE/BANDAGES/DRESSINGS) ×1 IMPLANT
GAUZE XEROFORM 1X8 LF (GAUZE/BANDAGES/DRESSINGS) ×1 IMPLANT
GLOVE BIO SURGEON STRL SZ8 (GLOVE) ×2 IMPLANT
GLOVE SURG UNDER LTX SZ8 (GLOVE) ×1 IMPLANT
GOWN STRL REUS W/ TWL LRG LVL3 (GOWN DISPOSABLE) ×1 IMPLANT
GOWN STRL REUS W/ TWL XL LVL3 (GOWN DISPOSABLE) ×1 IMPLANT
GOWN STRL REUS W/TWL LRG LVL3 (GOWN DISPOSABLE) ×1
GOWN STRL REUS W/TWL XL LVL3 (GOWN DISPOSABLE) ×1
KIT TURNOVER KIT A (KITS) ×1 IMPLANT
LOOP VESSEL MAXI  1X406 RED (MISCELLANEOUS) ×1
LOOP VESSEL MAXI 1X406 RED (MISCELLANEOUS) ×1 IMPLANT
MANIFOLD NEPTUNE II (INSTRUMENTS) ×1 IMPLANT
NDL HYPO 25X1 1.5 SAFETY (NEEDLE) ×1 IMPLANT
NEEDLE HYPO 25X1 1.5 SAFETY (NEEDLE) ×1 IMPLANT
NS IRRIG 500ML POUR BTL (IV SOLUTION) ×1 IMPLANT
PACK EXTREMITY ARMC (MISCELLANEOUS) ×1 IMPLANT
PAD ABD DERMACEA PRESS 5X9 (GAUZE/BANDAGES/DRESSINGS) ×1 IMPLANT
PAD CAST 4YDX4 CTTN HI CHSV (CAST SUPPLIES) ×3 IMPLANT
PADDING CAST COTTON 4X4 STRL (CAST SUPPLIES) ×3
SPLINT WRIST LG LT TX990309 (SOFTGOODS) ×1 IMPLANT
SPLINT WRIST M LT TX990308 (SOFTGOODS) ×1 IMPLANT
SPLINT WRIST M RT TX990303 (SOFTGOODS) ×1 IMPLANT
STAPLER SKIN PROX 35W (STAPLE) IMPLANT
STOCKINETTE IMPERVIOUS 9X36 MD (GAUZE/BANDAGES/DRESSINGS) ×1 IMPLANT
SUT PROLENE 4 0 PS 2 18 (SUTURE) ×1 IMPLANT
SUT VIC AB 2-0 CT1 (SUTURE) ×1 IMPLANT
SUT VIC AB 2-0 CT1 36 (SUTURE) IMPLANT
SUT VIC AB 3-0 SH 27 (SUTURE) ×2
SUT VIC AB 3-0 SH 27X BRD (SUTURE) IMPLANT
SWABSTK COMLB BENZOIN TINCTURE (MISCELLANEOUS) ×1 IMPLANT
TRAP FLUID SMOKE EVACUATOR (MISCELLANEOUS) ×1 IMPLANT
VACURETTE 12 RIGID CVD (CANNULA) ×1 IMPLANT
WATER STERILE IRR 500ML POUR (IV SOLUTION) ×1 IMPLANT

## 2021-11-15 NOTE — Discharge Instructions (Addendum)
Orthopedic discharge instructions: Keep dressing dry and intact. Keep hand elevated above heart level. May shower after dressing removed on postop day 4 (Sunday). Cover sutures with Band-Aids or ace wrap after drying off, then reapply Velcro splint. Apply ice to affected area frequently. Take ibuprofen 600-800 mg TID with meals for 5-7 days, then as necessary. Take ES Tylenol or pain medication as prescribed when needed.  Return for follow-up in 10-14 days or as scheduled.    AMBULATORY SURGERY  DISCHARGE INSTRUCTIONS   The drugs that you were given will stay in your system until tomorrow so for the next 24 hours you should not:  Drive an automobile Make any legal decisions Drink any alcoholic beverage   You may resume regular meals tomorrow.  Today it is better to start with liquids and gradually work up to solid foods.  You may eat anything you prefer, but it is better to start with liquids, then soup and crackers, and gradually work up to solid foods.   Please notify your doctor immediately if you have any unusual bleeding, trouble breathing, redness and pain at the surgery site, drainage, fever, or pain not relieved by medication.     Your post-operative visit with Dr.                                       is: Date:                        Time:    Please call to schedule your post-operative visit.  Additional Instructions:

## 2021-11-15 NOTE — Transfer of Care (Signed)
Immediate Anesthesia Transfer of Care Note  Patient: Margaret Gates  Procedure(s) Performed: Revision open left carpal tunnel release with subcutaneous transposition of ulnar nerve at left elbow (Left) ULNAR NERVE DECOMPRESSION/TRANSPOSITION (Left)  Patient Location: PACU  Anesthesia Type:General  Level of Consciousness: awake and drowsy  Airway & Oxygen Therapy: Patient Spontanous Breathing and Patient connected to face mask oxygen  Post-op Assessment: Report given to RN and Post -op Vital signs reviewed and stable  Post vital signs: Reviewed and stable  Last Vitals:  Vitals Value Taken Time  BP 124/83 11/15/21 1400  Temp 36.1 C 11/15/21 1400  Pulse 100 11/15/21 1407  Resp 18 11/15/21 1407  SpO2 100 % 11/15/21 1407  Vitals shown include unvalidated device data.  Last Pain:  Vitals:   11/15/21 1400  TempSrc:   PainSc: Asleep         Complications: No notable events documented.

## 2021-11-15 NOTE — H&P (Signed)
History of Present Illness: Margaret Gates is a 41 y.o. who presents today for a history and physical. She is to undergo a revision open left carpal tunnel release with subcutaneous transposition of ulnar nerve at left elbow. Date of surgery 11/15/2021. Since the patient was last here in the clinic she has not seen any improvement in her condition and wishes to proceed with surgery.  Patient has recently underwent surgeries for recurrent carpal tunnel on the right with a subcutaneous anterior transposition of the ulnar nerve on the right several weeks ago. She is doing well with this. However she continues to have issues with the left side.The patient notes continued pain and paresthesias to her left hand. These symptoms are aggravated by any repetitive activities as well as at night. A recent EMG has confirmed the presence of residual carpal tunnel syndrome as well as cubital tunnel syndrome. The patient remains frustrated by the symptoms and functional limitations and would like to consider surgical intervention for her left upper extremity at this time.   Past Medical History: Adrenal hyperplasia (CMS-HCC)  Chickenpox   Past Surgical History ARTHROSCOPY HIP Right 03/23/2015  Procedure: ARTHROSCOPY, HIP, SURGICAL; WITH FEMOROPLASTY (IE, TREATMENT OF CAM LESION); Surgeon: Rosalio Macadamia, MD; Location: [place]; Service: Orthopedics; Laterality: Right;  ARTHROSCOPY HIP Right 03/23/2015  Procedure: ARTHROSCOPY, HIP, SURGICAL; WITH LABRAL REPAIR; Surgeon: Rosalio Macadamia, MD; Location: [place]; Service: Orthopedics; Laterality: Right;  INTRAOPERATIVE FLUOROSCOPY Right 03/23/2015  Procedure: FLUOROSCOPY (SEPARATE PROCEDURE), UP TO 1 HOUR PHYSICIAN OR OTHER QUALIFIED HEALTH CARE PROFESSIONAL TIME, OTHER THAN [phone] OR [phone] (EG, CARDIAC FLUOROSCOPY); Surgeon: Rosalio Macadamia, MD; Location: [place]; Service: Orthopedics; Laterality: Right;  ARTHROSCOPY HIP Left 06/20/2015  Procedure: ARTHROSCOPY,  HIP, SURGICAL; WITH FEMOROPLASTY (IE, TREATMENT OF CAM LESION); Surgeon: Rosalio Macadamia, MD; Location: [place]; Service: Orthopedics; Laterality: Left;  ARTHROSCOPY HIP Left 06/20/2015  Procedure: ARTHROSCOPY, HIP, SURGICAL; WITH LABRAL REPAIR; Surgeon: Rosalio Macadamia, MD; Location: [place]; Service: Orthopedics; Laterality: Left;  INTRAOPERATIVE FLUOROSCOPY Left 06/20/2015  Procedure: FLUOROSCOPY, PHYSICIAN OR OTHER QUALIFIED HEALTH CARE PROFESSIONAL TIME MORE THAN 1 HOUR, ASSISTING A NONRADIOLOGIC PHYSICIAN OR OTHER QUALIFIED HEALTH CARE PROFESSIONAL; Surgeon: Rosalio Macadamia, MD; Location: [place]; Service: Orthopedics; Laterality: Left;  UNLISTED ARTHROSCOPY PROCEDURE Left 06/20/2015  Procedure: Complete capsular closure/iliofemoral ligament repair; Surgeon: Rosalio Macadamia, MD; Location: [place]; Service: Orthopedics; Laterality: Left;  ESOPHAGOGASTRODOUDENOSCOPY W/BIOPSY N/A 08/29/2018  Procedure: DIAGNOSTIC ESOPHAGOGASTRODUODENOSCOPY; Surgeon: Jerelene Redden, MD; Location: [facility]; Service: Gastroenterology; Laterality: N/A;  Endoscopic right carpal tunnel release Right 08/31/2020 (Dr. Roland Rack)  Endoscopic left carpal tunnel release Left 10/26/2020 (Dr. Roland Rack)  1. Open right carpal tunnel release. 2. Subcutaneous anterior transposition of ulnar nerve, right elbow. Right 08/22/2021 (Dr. Roland Rack)  APPENDECTOMY  CESAREAN SECTION x2  Removal of ovarian cyst with incidental appendectomy  TUBAL LIGATION   Past Family History Scoliosis Mother  Hodgkin's lymphoma Father  ADD / ADHD Brother  Anesthesia problems Neg Hx   Medications: acetaminophen (TYLENOL) 325 MG tablet Take 650 mg by mouth every 4 (four) hours as needed for Pain  albuterol 90 mcg/actuation inhaler  dexAMETHasone (DECADRON) 0.5 MG tablet Take 0.5 tablets (0.25 mg total) by mouth at bedtime for 90 days 60 tablet 4  fludrocortisone (FLORINEF) 0.1 mg tablet Take 1 tablet (0.1 mg total) by mouth once daily  for 90 days 90 tablet 4  hydrocortisone (CORTEF) 20 MG tablet Take 1.5 tablets (30 mg total) by mouth once daily for 90 days 135  tablet 0  hydrocortisone (SOLU-CORTEF) 100 mg injection Inject 100 mg as directed as needed (adrenal insufficiency).  hydrocortisone, PF, (SOLU-CORTEF) 50 mg/mL injection Milligrams intramuscular for adrenal crisis 2 mL 3  ibuprofen (MOTRIN) 200 MG tablet Take 400 mg by mouth as needed for Pain  omeprazole (PRILOSEC OTC) 20 MG EC tablet Take 1 tablet (20 mg total) by mouth once daily  sterile water for injection 2.5 mLs by Other route as directed (for steroid reconstitution prior to administration.) 2.5 mL 4  syringe with needle 3 mL 25 x 5/8" Syrg Use 1 each as directed 1 each 1  syringe with needle, safety 3 mL 25 gauge x 1" Syrg Use 1 each as directed (for steroid administration) 1 each 12  docusate sodium (STOOL SOFTENER ORAL) Take by mouth 2 (two) times daily  oxyCODONE (ROXICODONE) 5 MG immediate release tablet Take 1 tablet (5 mg total) by mouth every 6 (six) hours as needed for Pain 30 tablet 0   Allergies: Penicillins Unknown (childhood reaction - grandmother told her she was allergic to penicillin)  Tizanidine Nausea  Cyclobenzaprine Nausea    Review of Systems: A comprehensive 14 point ROS was performed, reviewed, and the pertinent orthopaedic findings are documented in the HPI.  Physical Exam: BP 132/80 (BP Location: Left upper arm, Patient Position: Sitting, BP Cuff Size: Adult)  Ht 167.6 cm ('5\' 6"'$ )  Wt 84.7 kg (186 lb 12.8 oz)  BMI 30.15 kg/m   General: Well-developed well-nourished female seen in no acute distress.   HEENT: Atraumatic,normocephalic. Pupils are equal and reactive to light. Oropharynx is clear with moist mucosa  Lungs: Clear to auscultation bilaterally   Cardiovascular: Regular rate and rhythm. Normal S1, S2. No murmurs. No appreciable gallops or rubs. Peripheral pulses are palpable.  Abdomen: Soft, non-tender,  nondistended. Bowel sounds present  Left elbow exam: Skin inspection of the left elbow again is unremarkable. No swelling, erythema, ecchymosis, abrasions, or other skin abnormalities are identified. There is no elbow effusion. She denies any tenderness to palpation over the medial or lateral aspects of the elbow, nor does she note any tenderness to palpation anteriorly or posteriorly. She demonstrates full active and passive range of motion of the elbow without pain or catching. However, if she keeps her elbow flexed for any length of time, she develops some paresthesias to her ring and little finger. She exhibits an equivocally positive Tinel's over the cubital tunnel.  Left wrist/hand exam: On examination, her previous surgical incision is well-healed and without evidence for infection. No swelling, erythema, ecchymosis, abrasions, or other skin abnormalities are identified. There is minimal residual tenderness to palpation over the base of her thumb, but otherwise she has no tenderness to palpation over the volar or dorsal aspects of her wrist or hand. She exhibits full range of motion of her wrist without any discomfort. She can actively flex and extend all digits fully without any pain or triggering, and is able to oppose her thumb to the base of the little finger. She notes mild residual "pulling" over the dorsal aspect of the base of her thumb with maximal thumb flexion. She exhibits a negative grind test and a negative Finkelstein's test. She remains neurovascularly intact to all digits.  Neurological: The patient is alert and oriented x3 Sensation to light touch appears to be intact and within normal limits Gross motor strength appeared to be equal to 5/5  Vascular: Peripheral pulses felt to be palpable. Capillary refill appears to be intact and within normal limits  X-ray: AP, lateral, and oblique views of the left hand are obtained. These films demonstrate no evidence for fractures,  lytic lesions, or significant degenerative changes, especially pertaining to the base of the left thumb.   Impression: 1.  Recurrent carpal tunnel syndrome, left wrist. 2. Cubital tunnel syndrome, left wrist.  Plan:  The treatment options were discussed with the patient. In addition, patient educational materials were provided regarding the diagnosis and treatment options. Regarding her left wrist and elbow symptoms, the patient is quite frustrated by her persistent symptoms and function limitations, and is ready to consider more aggressive treatment options. Therefore, I have recommended a surgical procedure, specifically a revision open left carpal tunnel release as well as a subcutaneous anterior transposition of the ulnar nerve at the left elbow. The procedure was discussed with the patient, as were the potential risks (including bleeding, infection, nerve and/or blood vessel injury, persistent or recurrent pain/paresthesias, weakness of grip, need for further surgery, blood clots, strokes, heart attacks and/or arhythmias, pneumonia, etc.) and benefits. The patient states her understanding and wishes to proceed. All of the patient's questions and concerns were answered. She can call any time with further concerns. She will follow up post-surgery, routine.     H&P reviewed and patient re-examined. No changes.

## 2021-11-15 NOTE — Op Note (Signed)
11/15/2021  2:05 PM  Patient:   Margaret Gates  Pre-Op Diagnosis:   1.  Recurrent left carpal tunnel syndrome.  2.  Left cubital tunnel syndrome.  Post-Op Diagnosis:   Same  Procedure:   1.  Open left carpal tunnel release.  2.  Subcutaneous anterior transposition of ulnar nerve, left elbow.  Surgeon:   Pascal Lux, MD  Anesthesia:   General LMA  Findings:   As above.  Complications:   None  EBL:   2 cc  Fluids:   600 cc crystalloid  TT:   70 minutes at 250 mmHg  Drains:   None  Closure:   4-0 Prolene interrupted sutures for the wrist and 3-0 subcuticular sutures for the elbow  Brief Clinical Note:   The patient is a 41 year old female with persistent pain and paresthesias to her left wrist and hand following an endoscopic left carpal tunnel release performed in October, 2022. The symptoms have persisted despite medications, activity modification, occupational therapy, etc. Her history and examination were consistent with persistent carpal tunnel syndrome. An EMG/NCV has confirmed the presence of recurrent carpal tunnel syndrome. The patient presents at this time for an open left carpal tunnel release.  This same EMG/NCV also demonstrate the presence of left cubital tunnel syndrome.  Therefore, the patient presents for a subcutaneous transposition of the ulnar nerve at the left elbow.  Procedure:   The patient was brought into the operating room and lain in the supine position. After adequate general laryngeal mask anesthesia was achieved, the right hand and upper extremity were prepped with ChloraPrep solution before being draped sterilely. Preoperative antibiotics were administered. A timeout was performed to verify the appropriate surgical site before the limb was exsanguinated with an Esmarch and the tourniquet inflated to 250 mmHg.   A 5-6 cm longitudinal incision was made beginning at the mid palm and extending proximally in a zigzag fashion across the wrist  flexion crease into the distal forearm. The incision was carried down through the subcutaneous tissues with care taken to identify and protect any neurovascular structures. The distal forearm fascia was penetrated just proximal to the transverse carpal ligament. The soft tissues were released off the superficial and deep surfaces of the distal forearm fascia and this was released proximally for 3-4 cm under direct visualization. The nerve was visualized directly before a Soil scientist was passed beneath the transverse carpal ligament along the ulnar aspect of the carpal tunnel and used to release any adhesions as well as to remove any adherent synovial tissue. It also was used to protect the underlying nerve as the recurrent scar tissue and residual transverse carpal ligament was transected in line with the incision. Several adhesions along the ulnar side of the nerve were released to allow the nerve to move more freely within the carpal tunnel. Hemostasis was achieved using bipolar electrocautery.  The wound was irrigated thoroughly with sterile saline solution before being closed using 4-0 Prolene interrupted sutures.   Next, the elbow was addressed. An approximately 7-8 cm curvilinear incision was made along the course of the ulnar nerve posterior to the medial epicondyle. The incision was carried down through the subcutaneous tissues with care taken to avoid the small branches of the medial antebrachial nerve to expose the sheath overlying the cubital tunnel. The ulnar nerve was identified at the proximal end of the tunnel and was dissected free. The nerve was then carefully followed as the roof of the cubital tunnel was released from proximal  to distal. Distally, the fascia overlying the pronator muscle was released for several centimeters. The nerve was clearly thickened just proximal to the midportion of the cubital tunnel, indicating the most likely source of the nerve compression. A vessel loop was  passed around the nerve and used to provide gentle traction on the nerve while circumferential dissection was carried out under loupe magnification using bipolar electrocautery and tenotomy scissors.   Once the nerve was fully mobilized, the anterior tissues were elevated as a flap just superficial to the fascia overlying the common flexor origin and a pocket created to accept the nerve. Care was taken to be sure that there was no undue tension along the nerve either proximally or distally. The nerve was carefully retracted while several 2-0 Vicryl interrupted sutures were placed to reapproximate the flap to the medial epicondylar soft tissues, thereby creating a "sling" for the ulnar nerve. The cubital tunnel itself was reapproximated using several 2-0 Vicryl interrupted sutures in order to prevent the nerve from falling back into the cubital tunnel.   The wound was copiously irrigated with sterile saline solution before the subcutaneous tissues were closed using 3-0 Vicryl interrupted sutures. The skin was closed using 3-0 Vicryl subcuticular sutures before benzoin and Steri-Strips were applied to the skin.  A total of 20 cc of 0.5% plain Sensorcaine was injected in and around the two incisions before sterile bulky dressings were applied to each wound. The patient was placed into a volar wrist splint before being awakened, extubated, and returned to the recovery room in satisfactory condition after tolerating the procedure well.

## 2021-11-15 NOTE — Anesthesia Preprocedure Evaluation (Signed)
Anesthesia Evaluation  Patient identified by MRN, date of birth, ID band Patient awake  General Assessment Comment:  In PACU at first carpal tunnel anesthetic encounter, it is charted that patient had a laryngospasm requiring positive pressure and succinylcholine administration. No issues at subsequent surgeries  Reviewed: Allergy & Precautions, NPO status , Patient's Chart, lab work & pertinent test results  History of Anesthesia Complications Negative for: history of anesthetic complications  Airway Mallampati: I  TM Distance: >3 FB Neck ROM: Full    Dental  (+) Poor Dentition, Chipped, Missing   Pulmonary asthma , neg sleep apnea, neg COPD, Current Smoker and Patient abstained from smoking. Never hospitalized for asthma. Takes inhalers every few months.    Pulmonary exam normal breath sounds clear to auscultation       Cardiovascular Exercise Tolerance: Good METS(-) hypertension(-) CAD and (-) Past MI negative cardio ROS Normal cardiovascular exam(-) dysrhythmias  Rhythm:Regular Rate:Normal - Systolic murmurs    Neuro/Psych  Headaches  Neuromuscular disease (R carpal tunnel)  negative psych ROS   GI/Hepatic Neg liver ROS,GERD  Medicated and Controlled,,  Endo/Other  neg diabetes  Adrenal insufficiency on mineralo and gluco corticoids. Has received stress dose steroids periop in the past.  Renal/GU negative Renal ROS     Musculoskeletal   Abdominal Normal abdominal exam  (+)   Peds  Hematology negative hematology ROS (+)   Anesthesia Other Findings Adrenal glad insufficiency- Pt took extra hydrocortisone. Plan to provide stress dose  Past Medical History: No date: 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HCC) No date: Asthma No date: Bronchitis No date: GERD (gastroesophageal reflux disease) No date: Headache 01/26/2021: Severe sepsis (HCC) No date: Tobacco use  Past Surgical History: 08/31/2020: CARPAL TUNNEL RELEASE; Right      Comment:  Procedure: CARPAL TUNNEL RELEASE ENDOSCOPIC;  Surgeon:               Corky Mull, MD;  Location: [facility];  Service:               Orthopedics;  Laterality: Right; 10/26/2020: CARPAL TUNNEL RELEASE; Left     Comment:  Procedure: CARPAL TUNNEL RELEASE ENDOSCOPIC;  Surgeon:               Corky Mull, MD;  Location: [facility];  Service:               Orthopedics;  Laterality: Left; No date: CESAREAN SECTION No date: HIP SURGERY; Bilateral     Comment:  LAPROSCOPIC BONE SPURS     Reproductive/Obstetrics negative OB ROS                              Anesthesia Physical Anesthesia Plan  ASA: 2  Anesthesia Plan: General   Post-op Pain Management: Ofirmev IV (intra-op)*   Induction: Intravenous  PONV Risk Score and Plan: 2 and Ondansetron, Dexamethasone and Midazolam  Airway Management Planned: LMA  Additional Equipment: None  Intra-op Plan:   Post-operative Plan: Extubation in OR  Informed Consent: I have reviewed the patients History and Physical, chart, labs and discussed the procedure including the risks, benefits and alternatives for the proposed anesthesia with the patient or authorized representative who has indicated his/her understanding and acceptance.     Dental advisory given  Plan Discussed with: CRNA and Surgeon  Anesthesia Plan Comments: (Discussed risks of anesthesia with patient, including PONV, sore throat, lip/dental damage. Rare risks discussed as well, such as cardiorespiratory and neurological  sequelae, and allergic reactions. Patient understands. Patient counseled on benefits of smoking cessation, and increased perioperative risks associated with continued smoking.  Plan on '100mg'$  hydrocortisone periop)         Anesthesia Quick Evaluation

## 2021-11-15 NOTE — Anesthesia Procedure Notes (Signed)
Procedure Name: LMA Insertion Date/Time: 11/15/2021 12:19 PM  Performed by: Cammie Sickle, CRNAPre-anesthesia Checklist: Patient identified, Patient being monitored, Timeout performed, Emergency Drugs available and Suction available Patient Re-evaluated:Patient Re-evaluated prior to induction Oxygen Delivery Method: Circle system utilized Preoxygenation: Pre-oxygenation with 100% oxygen Induction Type: IV induction Ventilation: Mask ventilation without difficulty LMA: LMA inserted LMA Size: 4.0 Tube type: Oral Number of attempts: 1 Placement Confirmation: positive ETCO2 and breath sounds checked- equal and bilateral Tube secured with: Tape Dental Injury: Teeth and Oropharynx as per pre-operative assessment

## 2021-11-16 NOTE — Anesthesia Postprocedure Evaluation (Signed)
Anesthesia Post Note  Patient: Margaret Gates  Procedure(s) Performed: Revision open left carpal tunnel release with subcutaneous transposition of ulnar nerve at left elbow (Left) ULNAR NERVE DECOMPRESSION/TRANSPOSITION (Left)  Patient location during evaluation: PACU Anesthesia Type: General Level of consciousness: awake and alert Pain management: pain level controlled Vital Signs Assessment: post-procedure vital signs reviewed and stable Respiratory status: spontaneous breathing, nonlabored ventilation, respiratory function stable and patient connected to nasal cannula oxygen Cardiovascular status: blood pressure returned to baseline and stable Postop Assessment: no apparent nausea or vomiting Anesthetic complications: no   No notable events documented.   Last Vitals:  Vitals:   11/15/21 1458 11/15/21 1525  BP: 118/83 125/87  Pulse: 90 92  Resp: 17 18  Temp: 36.4 C   SpO2: 97% 95%    Last Pain:  Vitals:   11/15/21 1525  TempSrc:   PainSc: 2                  Arita Miss

## 2021-12-11 ENCOUNTER — Encounter: Payer: Self-pay | Admitting: Occupational Therapy

## 2021-12-11 ENCOUNTER — Ambulatory Visit: Payer: BC Managed Care – PPO | Attending: Surgery | Admitting: Occupational Therapy

## 2021-12-11 DIAGNOSIS — L905 Scar conditions and fibrosis of skin: Secondary | ICD-10-CM | POA: Diagnosis not present

## 2021-12-11 DIAGNOSIS — M25532 Pain in left wrist: Secondary | ICD-10-CM | POA: Insufficient documentation

## 2021-12-11 DIAGNOSIS — M25632 Stiffness of left wrist, not elsewhere classified: Secondary | ICD-10-CM | POA: Diagnosis present

## 2021-12-11 DIAGNOSIS — M6281 Muscle weakness (generalized): Secondary | ICD-10-CM | POA: Diagnosis present

## 2021-12-11 DIAGNOSIS — M25522 Pain in left elbow: Secondary | ICD-10-CM | POA: Insufficient documentation

## 2021-12-11 NOTE — Therapy (Signed)
[facility] [address], [place], [address] Phone: [phone]   Fax:  [phone]  Occupational Therapy Evaluation  Patient Details  Name: Margaret Gates MRN: [id number] Date of Birth: [birth year] Referring Provider (OT): Dr Margaret Rosier PA   Encounter Date: 12/11/2021   OT End of Session - 12/11/21 1941     Visit Number 1    Number of Visits 16    Date for OT Re-Evaluation 02/05/22    OT Start Time 0903    OT Stop Time 0946    OT Time Calculation (min) 43 min    Activity Tolerance Patient tolerated treatment well    Behavior During Therapy [facility] for tasks assessed/performed             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 ([place])    Asthma    Bronchitis    GERD (gastroesophageal reflux disease)    Headache    Severe sepsis ([place]) 01/26/2021   Tobacco use     Past Surgical History:  Procedure Laterality Date   CARPAL TUNNEL RELEASE Right 08/31/2020   Procedure: CARPAL TUNNEL RELEASE ENDOSCOPIC;  Surgeon: Corky Mull, MD;  Location: [facility] ORS;  Service: Orthopedics;  Laterality: Right;   CARPAL TUNNEL RELEASE Left 10/26/2020   Procedure: CARPAL TUNNEL RELEASE ENDOSCOPIC;  Surgeon: Corky Mull, MD;  Location: [facility];  Service: Orthopedics;  Laterality: Left;   CARPAL TUNNEL RELEASE Right 08/22/2021   Procedure: OPEN REVISION RIGHT CARPAL TUNNEL RELEASE;  Surgeon: Corky Mull, MD;  Location: [facility];  Service: Orthopedics;  Laterality: Right;   CARPAL TUNNEL RELEASE Left 11/15/2021   Procedure: Revision open left carpal tunnel release with subcutaneous transposition of ulnar nerve at left elbow;  Surgeon: Corky Mull, MD;  Location: [facility];  Service: Orthopedics;  Laterality: Left;   CESAREAN SECTION     HIP SURGERY Bilateral    LAPROSCOPIC BONE SPURS   HIP SURGERY Bilateral    ULNAR NERVE TRANSPOSITION Right 08/22/2021    Procedure: SUBCUTANEOUS TRANSPOSITION OF THE ULNAR NERVE AT RIGHT ELBOW.;  Surgeon: Corky Mull, MD;  Location: [facility];  Service: Orthopedics;  Laterality: Right;   ULNAR NERVE TRANSPOSITION Left 11/15/2021   Procedure: ULNAR NERVE DECOMPRESSION/TRANSPOSITION;  Surgeon: Corky Mull, MD;  Location: [facility];  Service: Orthopedics;  Laterality: Left;   UPPER GI ENDOSCOPY  2020    There were no vitals filed for this visit.   Subjective Assessment - 12/11/21 1934     Subjective  This one feels better than the other one - not as painfull - swelling is better. But maybe it is because I knew what to expect. [employer] fired me.    Pertinent History Seen 11/27/21 by PA - Margaret Gates - Margaret Gates is a 41 y.o. female who presents today for her first postop appointment following a open carpal tunnel release of the left wrist due to recurrent left carpal tunnel syndrome in addition to a subcutaneous anterior transposition of the ulnar nerve at the left elbow. Surgery was performed by Dr. Roland Gates on 11/15/2021. The patient does still report moderate discomfort in the left arm however she does feel that she is doing well. She denies any trauma or injury affecting the left upper extremity since surgery. She denies any signs of infection at home such as fevers chills or drainage from the left elbow or wrist incision sites.  The patient reports a 7 out of 10 pain score at today's visit, the patient was given a refill of oxycodone by Dr. Roland Gates earlier today. 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    Currently in Pain? Yes    Pain Score 4     Pain Location Elbow    Pain Orientation Left    Pain Descriptors / Indicators Aching;Tightness;Tender;Sore;Numbness    Pain Type Surgical pain    Pain Onset 1 to 4 weeks ago    Pain  Frequency Constant               [facility] OT Assessment - 12/11/21 0001       Assessment   Medical Diagnosis L CTR and ulnar N transposition    Referring Provider (OT) Dr Margaret Rosier PA    Onset Date/Surgical Date 11/15/21    Hand Dominance Right    Prior Therapy previouse Bil CTR and R ulnar N Transposition      Home  Environment   Lives With Family      Prior Function   Vocation Part time employment   [employer]   Leisure Before previous surgery reported [employer].  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      AROM   Left Elbow Flexion 124    Left Elbow Extension -15    Left Wrist Extension 34 Degrees    Left Wrist Flexion 47 Degrees    Left Wrist Radial Deviation 20 Degrees    Left Wrist Ulnar Deviation 25 Degrees      Left Hand AROM   L Index  MCP 0-90 85 Degrees    L Index PIP 0-100 100 Degrees    L Long  MCP 0-90 90 Degrees    L Long PIP 0-100 100 Degrees    L Ring  MCP 0-90 85 Degrees    L Ring PIP 0-100 100 Degrees    L Little  MCP 0-90 85 Degrees                      OT Treatments/Exercises (OP) - 12/11/21 0001       LUE Contrast Bath   Time 8 minutes    Comments L elbow and wrist prior to review of HEP            After contrast patient educated on scar massage on wrist and elbow as well as desensitization. Provided patient with Cica -Care scar pad for both scars to use at nighttime. Metacarpal spreads.  As well as massage to webspace prior to thumb palmar radial abduction. Patient to do active range of motion for elbow flexion extension with pain less than 2/10 with palm up and thumb up 10 reps.  Against the wall. Gentle active assisted range of motion for wrist flexion extension as well as Active range of motion for tendon glides.  10 reps pain-free Thumb palmar radial abduction as well as opposition picking up 1 cm up check alternating digits.  8 reps. Patient had discomfort and pain with opposition to the  fifth.         OT Education - 12/11/21 1941     Education Details Findings of eval and HEP    Person(s) Educated Patient  Methods Explanation;Demonstration;Tactile cues;Verbal cues;Handout    Comprehension Verbal cues required;Returned demonstration;Verbalized understanding              OT Short Term Goals - 12/11/21 2052       OT SHORT TERM GOAL #1   Title Patient to be independent in home program to decrease scar tissue and pain with increased motion at L wrist and elbow flexion/ extension.    Baseline Pain at rest and with AROM 4/10 at the elbow and wrist 3/10 - tightness and very tender at elbow- elbow flexion 124 , ext -15, wrist ext 34, flexion 47    Time 3    Period Weeks    Status New    Target Date 01/01/22               OT Long Term Goals - 12/11/21 2053       OT LONG TERM GOAL #1   Title Hypersensitivity over both scars improved for patient to tolerate different textures, clothing and using tools without increase symptoms.    Baseline Pt very tender over proximal elbow scar - tender for massage and textures- and tender with scar massage and ROM at volar wrist -difficulty with gripping objects    Time 5    Period Weeks    Status New    Target Date 01/15/22      OT LONG TERM GOAL #2   Title Left elbow and wrist active range of motion increased to within normal limits for patient to be able to push and pull door open and turn doorknob    Baseline decrease elbow and wrist ROM - and pain elbow 4/10 at rest and wrist 3/10 - can do over head sweater but not pressure or gripping    Time 6    Period Weeks    Status New    Target Date 01/22/22      OT LONG TERM GOAL #3   Title Right elbow and wrist strength improved for patient to be able to push and pull door as well as turn doorknob, carrying and lift pots and pans while cooking with no increase symptoms    Baseline 3 1/2 wks s/p - pain and tenderness 3-4/10 at rest -and numbness in 4th and 5th -  decrease elbow and wrist flexion , ext    Time 8    Period Weeks    Status New    Target Date 02/05/22                   Plan - 12/11/21 2047     Clinical Impression Statement Patient presented OT evaluation with a diagnosis of L non dominant hand-  cubital tunnel release as well as open carpal tunnel release on 11/15/2021.  Patient is 3 1/2 weeks postop. Pt present with increase edema and pain over volar wrist and elbow. Increase scar tissue with decrease wrist and elbow motion more than digits and thumb. Pt with numbness reported in 5th and 4th digit. Pain at rest 4/10 at elbow and 3/10 at the wrist - pt limited in use of L hand and arm in ADL's and IADL's - pt can benefit from skilled OT services to decrease edema , scar tissue and pain - and increase ROM and strength to return to prior level of function.    OT Occupational Profile and History Problem Focused Assessment - Including review of records relating to presenting problem    Occupational performance deficits (Please refer to evaluation for  details): ADL's;IADL's;Rest and Sleep;Work;Play;Leisure;Social Participation    Body Structure / Function / Physical Skills ADL;Strength;Pain;UE functional use;ROM;IADL;Scar mobility;Sensation;Decreased knowledge of precautions;Edema    Rehab Potential Good    Clinical Decision Making Limited treatment options, no task modification necessary    Comorbidities Affecting Occupational Performance: May have comorbidities impacting occupational performance    Modification or Assistance to Complete Evaluation  No modification of tasks or assist necessary to complete eval    OT Frequency 2x / week    OT Duration 8 weeks    OT Treatment/Interventions Self-care/ADL training;Paraffin;Moist Heat;Fluidtherapy;Contrast Bath;Therapeutic exercise;Manual Therapy;Patient/family education;Passive range of motion;Scar mobilization;DME and/or AE instruction    Consulted and Agree with Plan of Care Patient              Patient will benefit from skilled therapeutic intervention in order to improve the following deficits and impairments:   Body Structure / Function / Physical Skills: ADL, Strength, Pain, UE functional use, ROM, IADL, Scar mobility, Sensation, Decreased knowledge of precautions, Edema       Visit Diagnosis: Scar condition and fibrosis of skin  Muscle weakness (generalized)  Pain in left elbow  Pain in left wrist    Problem List Patient Active Problem List   Diagnosis Date Noted   Cubital tunnel syndrome, right 08/18/2021   Bilateral thumb pain 04/03/2021   Adrenal insufficiency ([place]) 01/26/2021   Asthma 01/26/2021   GERD (gastroesophageal reflux disease) 01/26/2021   Tobacco use 01/26/2021   CAP (community acquired pneumonia) 01/26/2021   Carpal tunnel syndrome, right 08/19/2020   Tear of left acetabular labrum 05/06/2015   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, [place], [address] Phone: [phone]   Fax:  [phone]  Name: Margaret Gates MRN: [id number] Date of Birth: May 23, 1980

## 2021-12-14 ENCOUNTER — Ambulatory Visit: Payer: BC Managed Care – PPO | Admitting: Occupational Therapy

## 2021-12-14 DIAGNOSIS — L905 Scar conditions and fibrosis of skin: Secondary | ICD-10-CM | POA: Diagnosis not present

## 2021-12-14 DIAGNOSIS — M25532 Pain in left wrist: Secondary | ICD-10-CM

## 2021-12-14 DIAGNOSIS — M25522 Pain in left elbow: Secondary | ICD-10-CM

## 2021-12-14 DIAGNOSIS — M6281 Muscle weakness (generalized): Secondary | ICD-10-CM

## 2021-12-14 NOTE — Therapy (Signed)
[facility] [address] [place], [address] Phone: [phone]   Fax:  [phone]  Occupational Therapy Treatment  Patient Details  Name: Margaret Gates MRN: [id number] Date of Birth: 1980/10/12 Referring Provider (OT): Dr Earnest Rosier PA   Encounter Date: 12/14/2021   OT End of Session - 12/14/21 1000     Visit Number 2    Number of Visits 16    Date for OT Re-Evaluation 02/05/22    OT Start Time 0949    OT Stop Time 1032    OT Time Calculation (min) 43 min    Activity Tolerance Patient tolerated treatment well    Behavior During Therapy Margaret Gates for tasks assessed/performed             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 ([place])    Asthma    Bronchitis    GERD (gastroesophageal reflux disease)    Headache    Severe sepsis ([place]) 01/26/2021   Tobacco use     Past Surgical History:  Procedure Laterality Date   CARPAL TUNNEL RELEASE Right 08/31/2020   Procedure: CARPAL TUNNEL RELEASE ENDOSCOPIC;  Surgeon: Corky Mull, MD;  Location: [facility];  Service: Orthopedics;  Laterality: Right;   CARPAL TUNNEL RELEASE Left 10/26/2020   Procedure: CARPAL TUNNEL RELEASE ENDOSCOPIC;  Surgeon: Corky Mull, MD;  Location: [facility];  Service: Orthopedics;  Laterality: Left;   CARPAL TUNNEL RELEASE Right 08/22/2021   Procedure: OPEN REVISION RIGHT CARPAL TUNNEL RELEASE;  Surgeon: Corky Mull, MD;  Location: [facility];  Service: Orthopedics;  Laterality: Right;   CARPAL TUNNEL RELEASE Left 11/15/2021   Procedure: Revision open left carpal tunnel release with subcutaneous transposition of ulnar nerve at left elbow;  Surgeon: Corky Mull, MD;  Location: [facility];  Service: Orthopedics;  Laterality: Left;   CESAREAN SECTION     HIP SURGERY Bilateral    LAPROSCOPIC BONE SPURS   HIP SURGERY Bilateral    ULNAR NERVE TRANSPOSITION Right 08/22/2021    Procedure: SUBCUTANEOUS TRANSPOSITION OF THE ULNAR NERVE AT RIGHT ELBOW.;  Surgeon: Corky Mull, MD;  Location: [facility];  Service: Orthopedics;  Laterality: Right;   ULNAR NERVE TRANSPOSITION Left 11/15/2021   Procedure: ULNAR NERVE DECOMPRESSION/TRANSPOSITION;  Surgeon: Corky Mull, MD;  Location: [facility];  Service: Orthopedics;  Laterality: Left;   UPPER GI ENDOSCOPY  2020    There were no vitals filed for this visit.   Subjective Assessment - 12/14/21 0958     Subjective  Doing okay - just stiff this morning my hand and elbow - still cannot get it straight    Pertinent History Seen 11/27/21 by PA - Cameron Proud - Margaret Gates is a 41 y.o. female who presents today for her first postop appointment following a open carpal tunnel release of the left wrist due to recurrent left carpal tunnel syndrome in addition to a subcutaneous anterior transposition of the ulnar nerve at the left elbow. Surgery was performed by Dr. Roland Rack on 11/15/2021. The patient does still report moderate discomfort in the left arm however she does feel that she is doing well. She denies any trauma or injury affecting the left upper extremity since surgery. She denies any signs of infection at home such as fevers chills or drainage from the left elbow or wrist incision sites. The patient reports a 7 out of 10 pain score at today's  visit, the patient was given a refill of oxycodone by Dr. Roland Rack earlier today. 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    Currently in Pain? Yes    Pain Score 2     Pain Location Elbow    Pain Orientation Left    Pain Descriptors / Indicators Aching;Tightness;Tender    Pain Type Surgical pain    Pain Onset 1 to 4 weeks ago    Pain Frequency Intermittent              Patient continues to be  limited in elbow extension endrange as well as wrist extension.            OT Treatments/Exercises (OP) - 12/14/21 0001       Moist Heat Therapy   Number Minutes Moist Heat 8 Minutes    Moist Heat Location Elbow   wrist - supine using for slight extention stretch for elbow           Patient able to get full elbow extension in all 3 positions with a slight pull less than 2/10 Elbow flexion and extension with pain less than 2/10 done  10 reps added to home exercises.   Increased pain and discomfort at scar -soft tissue scar massage done as well as manual over Cica -Care scar pad.  Patient can use at home.   Still hypersensitive over ulnar nerve incision.   Massage on wrist and elbow as well as desensitization. Provided patient with Cica -Care scar pad for both scars to use at nighttime. Metacarpal spreads.  As well as massage to webspace prior to thumb palmar radial abduction. Patient to do active range of motion for elbow flexion extension with pain less than 2/10 with palm up and thumb up 10 reps.  Against the wall. Can use moist heat for endrange elbow extension. Gentle active assisted range of motion for wrist flexion extension as well as Active range of motion for tendon glides.  10 reps pain-free Thumb palmar radial abduction as well as opposition picking up 1 cm up check alternating digits.  8 reps. Patient had less pain with opposition to the fifth digit and was able to get to second fold.       OT Education - 12/14/21 1000     Education Details progress and changes to HEP    Person(s) Educated Patient    Methods Explanation;Demonstration;Tactile cues;Verbal cues;Handout    Comprehension Verbal cues required;Returned demonstration;Verbalized understanding              OT Short Term Goals - 12/11/21 2052       OT SHORT TERM GOAL #1   Title Patient to be independent in home program to decrease scar tissue and pain with increased motion at L wrist and  elbow flexion/ extension.    Baseline Pain at rest and with AROM 4/10 at the elbow and wrist 3/10 - tightness and very tender at elbow- elbow flexion 124 , ext -15, wrist ext 34, flexion 47    Time 3    Period Weeks    Status New    Target Date 01/01/22               OT Long Term Goals - 12/11/21 2053  OT LONG TERM GOAL #1   Title Hypersensitivity over both scars improved for patient to tolerate different textures, clothing and using tools without increase symptoms.    Baseline Pt very tender over proximal elbow scar - tender for massage and textures- and tender with scar massage and ROM at volar wrist -difficulty with gripping objects    Time 5    Period Weeks    Status New    Target Date 01/15/22      OT LONG TERM GOAL #2   Title Left elbow and wrist active range of motion increased to within normal limits for patient to be able to push and pull door open and turn doorknob    Baseline decrease elbow and wrist ROM - and pain elbow 4/10 at rest and wrist 3/10 - can do over head sweater but not pressure or gripping    Time 6    Period Weeks    Status New    Target Date 01/22/22      OT LONG TERM GOAL #3   Title Right elbow and wrist strength improved for patient to be able to push and pull door as well as turn doorknob, carrying and lift pots and pans while cooking with no increase symptoms    Baseline 3 1/2 wks s/p - pain and tenderness 3-4/10 at rest -and numbness in 4th and 5th - decrease elbow and wrist flexion , ext    Time 8    Period Weeks    Status New    Target Date 02/05/22                   Plan - 12/14/21 1000     Clinical Impression Statement Patient presented OT evaluation with a diagnosis of L non dominant hand-  cubital tunnel release as well as open carpal tunnel release on 11/15/2021.  Patient is 4 weeks postop. Pt present with increase edema and pain over volar wrist and elbow. Increase scar tissue with decrease wrist and elbow motion more  than digits and thumb. Pt with numbness reported in 5th and 4th digit. Pain at rest decrease but increase with end range elbow extention end range and scar on volar wrist and CT.  Pt limited in use of L hand and arm in ADL's and IADL's - pt can benefit from skilled OT services to decrease edema , scar tissue and pain - and increase ROM and strength to return to prior level of function.    OT Occupational Profile and History Problem Focused Assessment - Including review of records relating to presenting problem    Occupational performance deficits (Please refer to evaluation for details): ADL's;IADL's;Rest and Sleep;Work;Play;Leisure;Social Participation    Body Structure / Function / Physical Skills ADL;Strength;Pain;UE functional use;ROM;IADL;Scar mobility;Sensation;Decreased knowledge of precautions;Edema    Rehab Potential Good    Clinical Decision Making Limited treatment options, no task modification necessary    Comorbidities Affecting Occupational Performance: May have comorbidities impacting occupational performance    Modification or Assistance to Complete Evaluation  No modification of tasks or assist necessary to complete eval    OT Frequency 2x / week    OT Duration 8 weeks    OT Treatment/Interventions Self-care/ADL training;Paraffin;Moist Heat;Fluidtherapy;Contrast Bath;Therapeutic exercise;Manual Therapy;Patient/family education;Passive range of motion;Scar mobilization;DME and/or AE instruction    Consulted and Agree with Plan of Care Patient             Patient will benefit from skilled therapeutic intervention in order to improve the following deficits  and impairments:   Body Structure / Function / Physical Skills: ADL, Strength, Pain, UE functional use, ROM, IADL, Scar mobility, Sensation, Decreased knowledge of precautions, Edema       Visit Diagnosis: Scar condition and fibrosis of skin  Muscle weakness (generalized)  Pain in left elbow  Pain in left  wrist    Problem List Patient Active Problem List   Diagnosis Date Noted   Cubital tunnel syndrome, right 08/18/2021   Bilateral thumb pain 04/03/2021   Adrenal insufficiency ([place]) 01/26/2021   Asthma 01/26/2021   GERD (gastroesophageal reflux disease) 01/26/2021   Tobacco use 01/26/2021   CAP (community acquired pneumonia) 01/26/2021   Carpal tunnel syndrome, right 08/19/2020   Tear of left acetabular labrum 05/06/2015   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, [place], [address] Phone: [phone]   Fax:  [phone]  Name: Margaret Gates MRN: [id number] Date of Birth: 01/14/1980

## 2021-12-19 ENCOUNTER — Ambulatory Visit: Payer: BC Managed Care – PPO | Admitting: Occupational Therapy

## 2021-12-19 DIAGNOSIS — L905 Scar conditions and fibrosis of skin: Secondary | ICD-10-CM | POA: Diagnosis not present

## 2021-12-19 DIAGNOSIS — M25522 Pain in left elbow: Secondary | ICD-10-CM

## 2021-12-19 DIAGNOSIS — M25532 Pain in left wrist: Secondary | ICD-10-CM

## 2021-12-19 DIAGNOSIS — M6281 Muscle weakness (generalized): Secondary | ICD-10-CM

## 2021-12-19 NOTE — Therapy (Signed)
[facility] [address], [place], [address] Phone: [phone]   Fax:  [phone]  Occupational Therapy Treatment  Patient Details  Name: Margaret Gates MRN: [id number] Date of Birth: [birth year] Referring Provider (OT): Dr Roland Rack, Mikle Bosworth PA   Encounter Date: 12/19/2021   OT End of Session - 12/19/21 0846     Visit Number 3    Number of Visits 16    Date for OT Re-Evaluation 02/05/22    OT Start Time 0900    OT Stop Time 0943    OT Time Calculation (min) 43 min    Activity Tolerance Patient tolerated treatment well    Behavior During Therapy [facility] for tasks assessed/performed             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 ([place])    Asthma    Bronchitis    GERD (gastroesophageal reflux disease)    Headache    Severe sepsis ([place]) 01/26/2021   Tobacco use     Past Surgical History:  Procedure Laterality Date   CARPAL TUNNEL RELEASE Right 08/31/2020   Procedure: CARPAL TUNNEL RELEASE ENDOSCOPIC;  Surgeon: Corky Mull, MD;  Location: [facility];  Service: Orthopedics;  Laterality: Right;   CARPAL TUNNEL RELEASE Left 10/26/2020   Procedure: CARPAL TUNNEL RELEASE ENDOSCOPIC;  Surgeon: Corky Mull, MD;  Location: [facility];  Service: Orthopedics;  Laterality: Left;   CARPAL TUNNEL RELEASE Right 08/22/2021   Procedure: OPEN REVISION RIGHT CARPAL TUNNEL RELEASE;  Surgeon: Corky Mull, MD;  Location: [facility];  Service: Orthopedics;  Laterality: Right;   CARPAL TUNNEL RELEASE Left 11/15/2021   Procedure: Revision open left carpal tunnel release with subcutaneous transposition of ulnar nerve at left elbow;  Surgeon: Corky Mull, MD;  Location: [facility];  Service: Orthopedics;  Laterality: Left;   CESAREAN SECTION     HIP SURGERY Bilateral    LAPROSCOPIC BONE SPURS   HIP SURGERY Bilateral    ULNAR NERVE TRANSPOSITION Right 08/22/2021    Procedure: SUBCUTANEOUS TRANSPOSITION OF THE ULNAR NERVE AT RIGHT ELBOW.;  Surgeon: Corky Mull, MD;  Location: [facility];  Service: Orthopedics;  Laterality: Right;   ULNAR NERVE TRANSPOSITION Left 11/15/2021   Procedure: ULNAR NERVE DECOMPRESSION/TRANSPOSITION;  Surgeon: Corky Mull, MD;  Location: [facility];  Service: Orthopedics;  Laterality: Left;   UPPER GI ENDOSCOPY  2020    There were no vitals filed for this visit.   Subjective Assessment - 12/19/21 0846     Subjective  Doing much better- my elbow is more straight -my wrist is better but scar at the back of my elbow pulls when I try and straighten the arm -also at the wrist my scar tender    Pertinent History Seen 11/27/21 by PA - Cameron Proud - Margaret Gates is a 41 y.o. female who presents today for her first postop appointment following a open carpal tunnel release of the left wrist due to recurrent left carpal tunnel syndrome in addition to a subcutaneous anterior transposition of the ulnar nerve at the left elbow. Surgery was performed by Dr. Roland Rack on 11/15/2021. The patient does still report moderate discomfort in the left arm however she does feel that she is doing well. She denies any trauma or injury affecting the left upper extremity since surgery. She denies any signs of infection at home such as fevers chills or drainage from the  left elbow or wrist incision sites. The patient reports a 7 out of 10 pain score at today's visit, the patient was given a refill of oxycodone by Dr. Roland Rack earlier today. 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    Currently in Pain? Yes    Pain Score 2     Pain Location --   bilateral scars   Pain Orientation Left    Pain Descriptors / Indicators Tightness;Tender    Pain Type Surgical pain                 [facility] OT Assessment - 12/19/21 0001       AROM   Left Elbow Flexion 145    Left Elbow Extension -8    Left Wrist Extension 60 Degrees    Left Wrist Flexion 72 Degrees    Left Wrist Radial Deviation 24 Degrees    Left Wrist Ulnar Deviation 30 Degrees               Great progress coming in at wrist and elbow AROM -less pain - proximal scar at elbow most tender and tight as well as CT scar at wrist.       OT Treatments/Exercises (OP) - 12/19/21 0001       LUE Contrast Bath   Time 8 minutes    Comments L elbow and wrist prior to ROM and scar massage            Patient able to get full elbow extension in all 3 positions with a slight pull less than 2/10 in session Elbow flexion and extension done in 3 positions in clinic 10 reps  soft tissue scar massage done over Cica -Care scar pad at proximal scar at cubital tunnel and over CT - tolerate better over cica scar pad -use mini massager  MC and CT spreads done by OT 10 reps each prior to [facility] for thumb PA and RA   Patient to do active range of motion for elbow flexion/ extension with pain less than 2/10 with palm up and thumb up 10 reps.  Against the wall. Can use moist heat for endrange elbow extension. Gentle active assisted range of motion for wrist flexion extension - with focus by OT for PROM for wrist extention   Active range of motion for tendon glides.  10 reps pain-free Thumb palmar /radial abduction as well as opposition - alternating digits.  8 reps. Individual  tendon glides add to HEP - and doing different signs with digits             OT Education - 12/19/21 0846     Education Details progress and changes to HEP    Person(s) Educated Patient    Methods Explanation;Demonstration;Tactile cues;Verbal cues;Handout    Comprehension Verbal cues required;Returned demonstration;Verbalized understanding              OT Short Term Goals - 12/11/21 2052       OT SHORT TERM GOAL #1    Title Patient to be independent in home program to decrease scar tissue and pain with increased motion at L wrist and elbow flexion/ extension.    Baseline Pain at rest and with AROM 4/10 at the elbow and wrist 3/10 - tightness and very tender  at elbow- elbow flexion 124 , ext -15, wrist ext 34, flexion 47    Time 3    Period Weeks    Status New    Target Date 01/01/22               OT Long Term Goals - 12/11/21 2053       OT LONG TERM GOAL #1   Title Hypersensitivity over both scars improved for patient to tolerate different textures, clothing and using tools without increase symptoms.    Baseline Pt very tender over proximal elbow scar - tender for massage and textures- and tender with scar massage and ROM at volar wrist -difficulty with gripping objects    Time 5    Period Weeks    Status New    Target Date 01/15/22      OT LONG TERM GOAL #2   Title Left elbow and wrist active range of motion increased to within normal limits for patient to be able to push and pull door open and turn doorknob    Baseline decrease elbow and wrist ROM - and pain elbow 4/10 at rest and wrist 3/10 - can do over head sweater but not pressure or gripping    Time 6    Period Weeks    Status New    Target Date 01/22/22      OT LONG TERM GOAL #3   Title Right elbow and wrist strength improved for patient to be able to push and pull door as well as turn doorknob, carrying and lift pots and pans while cooking with no increase symptoms    Baseline 3 1/2 wks s/p - pain and tenderness 3-4/10 at rest -and numbness in 4th and 5th - decrease elbow and wrist flexion , ext    Time 8    Period Weeks    Status New    Target Date 02/05/22                   Plan - 12/19/21 0847     Clinical Impression Statement Patient presented OT evaluation with a diagnosis of L non dominant hand-  cubital tunnel release as well as open carpal tunnel release on 11/15/2021.  Patient is 5 weeks postop. Pt present  with continues edema at elbow and wrist- and pain over scars at wrist and elbow. Increase scar tissue with decrease wrist and elbow motion end range.Pt with numbness reported in 5th and 4th digit. Pain at rest decrease but increase with end range elbow extention  and prox scar at elbow and CT scar.  Pt limited in use of L hand and arm in ADL's and IADL's - pt can benefit from skilled OT services to decrease edema , scar tissue and pain - and increase ROM and strength to return to prior level of function.    OT Occupational Profile and History Problem Focused Assessment - Including review of records relating to presenting problem    Occupational performance deficits (Please refer to evaluation for details): ADL's;IADL's;Rest and Sleep;Work;Play;Leisure;Social Participation    Body Structure / Function / Physical Skills ADL;Strength;Pain;UE functional use;ROM;IADL;Scar mobility;Sensation;Decreased knowledge of precautions;Edema    Rehab Potential Good    Clinical Decision Making Limited treatment options, no task modification necessary    Comorbidities Affecting Occupational Performance: May have comorbidities impacting occupational performance    Modification or Assistance to Complete Evaluation  No modification of tasks or assist necessary to complete eval    OT Frequency 2x / week  OT Duration 8 weeks    OT Treatment/Interventions Self-care/ADL training;Paraffin;Moist Heat;Fluidtherapy;Contrast Bath;Therapeutic exercise;Manual Therapy;Patient/family education;Passive range of motion;Scar mobilization;DME and/or AE instruction    Consulted and Agree with Plan of Care Patient             Patient will benefit from skilled therapeutic intervention in order to improve the following deficits and impairments:   Body Structure / Function / Physical Skills: ADL, Strength, Pain, UE functional use, ROM, IADL, Scar mobility, Sensation, Decreased knowledge of precautions, Edema       Visit  Diagnosis: Scar condition and fibrosis of skin  Muscle weakness (generalized)  Pain in left elbow  Pain in left wrist    Problem List Patient Active Problem List   Diagnosis Date Noted   Cubital tunnel syndrome, right 08/18/2021   Bilateral thumb pain 04/03/2021   Adrenal insufficiency ([place]) 01/26/2021   Asthma 01/26/2021   GERD (gastroesophageal reflux disease) 01/26/2021   Tobacco use 01/26/2021   CAP (community acquired pneumonia) 01/26/2021   Carpal tunnel syndrome, right 08/19/2020   Tear of left acetabular labrum 05/06/2015   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, [place], [address] Phone: [phone]   Fax:  [phone]  Name: Margaret Gates MRN: [id number] Date of Birth: [birth year]

## 2021-12-21 ENCOUNTER — Ambulatory Visit: Payer: BC Managed Care – PPO | Admitting: Occupational Therapy

## 2021-12-21 DIAGNOSIS — L905 Scar conditions and fibrosis of skin: Secondary | ICD-10-CM | POA: Diagnosis not present

## 2021-12-21 DIAGNOSIS — M25522 Pain in left elbow: Secondary | ICD-10-CM

## 2021-12-21 DIAGNOSIS — M25632 Stiffness of left wrist, not elsewhere classified: Secondary | ICD-10-CM

## 2021-12-21 DIAGNOSIS — M6281 Muscle weakness (generalized): Secondary | ICD-10-CM

## 2021-12-21 DIAGNOSIS — M25532 Pain in left wrist: Secondary | ICD-10-CM

## 2021-12-21 NOTE — Therapy (Signed)
[facility] [address], [place], [address] Phone: [phone]   Fax:  [phone]  Occupational Therapy Treatment  Patient Details  Name: Margaret Gates MRN: [id number] Date of Birth: 1980-03-29 Referring Provider (OT): Dr Earnest Rosier PA   Encounter Date: 12/21/2021   OT End of Session - 12/21/21 1127     Visit Number 4    Number of Visits 16    Date for OT Re-Evaluation 02/05/22    OT Start Time 1038    OT Stop Time 1118    OT Time Calculation (min) 40 min    Activity Tolerance Patient tolerated treatment well    Behavior During Therapy [facility] for tasks assessed/performed             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 ([place])    Asthma    Bronchitis    GERD (gastroesophageal reflux disease)    Headache    Severe sepsis ([place]) 01/26/2021   Tobacco use     Past Surgical History:  Procedure Laterality Date   CARPAL TUNNEL RELEASE Right 08/31/2020   Procedure: CARPAL TUNNEL RELEASE ENDOSCOPIC;  Surgeon: Corky Mull, MD;  Location: [facility];  Service: Orthopedics;  Laterality: Right;   CARPAL TUNNEL RELEASE Left 10/26/2020   Procedure: CARPAL TUNNEL RELEASE ENDOSCOPIC;  Surgeon: Corky Mull, MD;  Location: [facility];  Service: Orthopedics;  Laterality: Left;   CARPAL TUNNEL RELEASE Right 08/22/2021   Procedure: OPEN REVISION RIGHT CARPAL TUNNEL RELEASE;  Surgeon: Corky Mull, MD;  Location: [facility];  Service: Orthopedics;  Laterality: Right;   CARPAL TUNNEL RELEASE Left 11/15/2021   Procedure: Revision open left carpal tunnel release with subcutaneous transposition of ulnar nerve at left elbow;  Surgeon: Corky Mull, MD;  Location: [facility];  Service: Orthopedics;  Laterality: Left;   CESAREAN SECTION     HIP SURGERY Bilateral    LAPROSCOPIC BONE SPURS   HIP SURGERY Bilateral    ULNAR NERVE TRANSPOSITION Right 08/22/2021    Procedure: SUBCUTANEOUS TRANSPOSITION OF THE ULNAR NERVE AT RIGHT ELBOW.;  Surgeon: Corky Mull, MD;  Location: [facility];  Service: Orthopedics;  Laterality: Right;   ULNAR NERVE TRANSPOSITION Left 11/15/2021   Procedure: ULNAR NERVE DECOMPRESSION/TRANSPOSITION;  Surgeon: Corky Mull, MD;  Location: [facility];  Service: Orthopedics;  Laterality: Left;   UPPER GI ENDOSCOPY  2020    There were no vitals filed for this visit.   Subjective Assessment - 12/21/21 1123     Pertinent History Seen 11/27/21 by PA - Cameron Proud - Margaret Gates is a 41 y.o. female who presents today for her first postop appointment following a open carpal tunnel release of the left wrist due to recurrent left carpal tunnel syndrome in addition to a subcutaneous anterior transposition of the ulnar nerve at the left elbow. Surgery was performed by Dr. Roland Rack on 11/15/2021. The patient does still report moderate discomfort in the left arm however she does feel that she is doing well. She denies any trauma or injury affecting the left upper extremity since surgery. She denies any signs of infection at home such as fevers chills or drainage from the left elbow or wrist incision sites. The patient reports a 7 out of 10 pain score at today's visit, the patient was given a refill of oxycodone by Dr. Roland Rack earlier today. 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    Currently in Pain? Yes    Pain Score 2     Pain Location --   both scars   Pain Orientation Left    Pain Descriptors / Indicators Tightness;Tender    Pain Type Surgical pain    Pain Frequency Intermittent                 Pt show increase elbow extention during Ulnar N glide- but decrease wrist ext and composite          OT Treatments/Exercises (OP) - 12/21/21 0001        LUE Contrast Bath   Time 8 minutes    Comments L elbow and hand/wrist prior to scar massage            Patient able to get full elbow extension in all 3 positions with a slight pull less than 2/10 in session Elbow flexion and extension done in 3 positions in clinic 10 reps  soft tissue scar massage done over Cica -Care scar pad at proximal scar at cubital tunnel and over CT - tolerate better over cica scar pad -manual at elbow and mini massager at CT [facility] and CT spreads done by OT 10 reps each prior to [facility] for thumb PA and RA    Patient to do active range of motion for elbow flexion/ extension with pain less than 2/10 with palm up and thumb up 10 reps.  Against the wall. Can use moist heat for endrange elbow extension. Gentle active assisted range of motion for wrist flexion extension( prayer stretch) - with focus by OT for PROM for wrist extention    Active range of motion for tendon glides.  10 reps pain-free Thumb palmar /radial abduction as well as opposition - alternating digits.  8 reps. Individual  tendon glides to cont with at home - and doing different signs with digits                 OT Education - 12/21/21 1124     Education Details progress and changes to HEP    Person(s) Educated Patient    Methods Explanation;Demonstration;Tactile cues;Verbal cues;Handout    Comprehension Verbal cues required;Returned demonstration;Verbalized understanding              OT Short Term Goals - 12/11/21 2052       OT SHORT TERM GOAL #1   Title Patient to be independent in home program to decrease scar tissue and pain with increased motion at L wrist and elbow flexion/ extension.    Baseline Pain at rest and with AROM 4/10 at the elbow and wrist 3/10 - tightness and very tender at elbow- elbow flexion 124 , ext -15, wrist ext 34, flexion 47    Time 3    Period Weeks    Status New    Target Date 01/01/22               OT Long Term Goals - 12/11/21 2053        OT LONG TERM GOAL #1   Title Hypersensitivity over both scars improved for patient to tolerate different textures, clothing and using tools without increase symptoms.    Baseline Pt very tender over proximal elbow scar - tender for massage and textures-  and tender with scar massage and ROM at volar wrist -difficulty with gripping objects    Time 5    Period Weeks    Status New    Target Date 01/15/22      OT LONG TERM GOAL #2   Title Left elbow and wrist active range of motion increased to within normal limits for patient to be able to push and pull door open and turn doorknob    Baseline decrease elbow and wrist ROM - and pain elbow 4/10 at rest and wrist 3/10 - can do over head sweater but not pressure or gripping    Time 6    Period Weeks    Status New    Target Date 01/22/22      OT LONG TERM GOAL #3   Title Right elbow and wrist strength improved for patient to be able to push and pull door as well as turn doorknob, carrying and lift pots and pans while cooking with no increase symptoms    Baseline 3 1/2 wks s/p - pain and tenderness 3-4/10 at rest -and numbness in 4th and 5th - decrease elbow and wrist flexion , ext    Time 8    Period Weeks    Status New    Target Date 02/05/22                   Plan - 12/21/21 1127     Clinical Impression Statement Patient presented OT evaluation with a diagnosis of L non dominant hand-  cubital tunnel release as well as open carpal tunnel release on 11/15/2021.  Patient is 5 1/2 weeks postop. Pt present with continues edema at elbow and wrist- and pain over scars at wrist and elbow. Increase scar tissue with decrease wrist and elbow motion end range.Pt with numbness reported in 5th and 4th digit. Pain at rest decrease but increase with end range elbow extention  at prox scar at elbow and CT scar.  Pt limited in use of L hand and arm in ADL's and IADL's - pt can benefit from skilled OT services to decrease edema , scar tissue and  pain - and increase ROM and strength to return to prior level of function.    OT Occupational Profile and History Problem Focused Assessment - Including review of records relating to presenting problem    Occupational performance deficits (Please refer to evaluation for details): ADL's;IADL's;Rest and Sleep;Work;Play;Leisure;Social Participation    Body Structure / Function / Physical Skills ADL;Strength;Pain;UE functional use;ROM;IADL;Scar mobility;Sensation;Decreased knowledge of precautions;Edema    Rehab Potential Good    Clinical Decision Making Limited treatment options, no task modification necessary    Comorbidities Affecting Occupational Performance: May have comorbidities impacting occupational performance    Modification or Assistance to Complete Evaluation  No modification of tasks or assist necessary to complete eval    OT Frequency 2x / week    OT Duration 8 weeks    OT Treatment/Interventions Self-care/ADL training;Paraffin;Moist Heat;Fluidtherapy;Contrast Bath;Therapeutic exercise;Manual Therapy;Patient/family education;Passive range of motion;Scar mobilization;DME and/or AE instruction    Consulted and Agree with Plan of Care Patient             Patient will benefit from skilled therapeutic intervention in order to improve the following deficits and impairments:   Body Structure / Function / Physical Skills: ADL, Strength, Pain, UE functional use, ROM, IADL, Scar mobility, Sensation, Decreased knowledge of precautions, Edema       Visit Diagnosis: Scar condition and fibrosis of skin  Muscle weakness (generalized)  Pain in left elbow  Pain in left wrist  Stiffness of left wrist, not elsewhere classified    Problem List Patient Active Problem List   Diagnosis Date Noted   Cubital tunnel syndrome, right 08/18/2021   Bilateral thumb pain 04/03/2021   Adrenal insufficiency ([place]) 01/26/2021   Asthma 01/26/2021   GERD (gastroesophageal reflux disease) 01/26/2021    Tobacco use 01/26/2021   CAP (community acquired pneumonia) 01/26/2021   Carpal tunnel syndrome, right 08/19/2020   Tear of left acetabular labrum 05/06/2015   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, [place], [address] Phone: [phone]   Fax:  [phone]  Name: Margaret Gates MRN: [id number] Date of Birth: 09/07/1980

## 2021-12-28 ENCOUNTER — Ambulatory Visit: Payer: BC Managed Care – PPO | Admitting: Occupational Therapy

## 2021-12-28 ENCOUNTER — Encounter: Payer: Self-pay | Admitting: Occupational Therapy

## 2021-12-28 DIAGNOSIS — L905 Scar conditions and fibrosis of skin: Secondary | ICD-10-CM | POA: Diagnosis not present

## 2021-12-28 DIAGNOSIS — M6281 Muscle weakness (generalized): Secondary | ICD-10-CM

## 2021-12-28 DIAGNOSIS — M25632 Stiffness of left wrist, not elsewhere classified: Secondary | ICD-10-CM

## 2021-12-28 DIAGNOSIS — M25522 Pain in left elbow: Secondary | ICD-10-CM

## 2021-12-28 DIAGNOSIS — M25532 Pain in left wrist: Secondary | ICD-10-CM

## 2021-12-29 NOTE — Therapy (Signed)
[place] [facility] [address] [place], [address] Phone: [phone]   Fax:  [phone]  Occupational Therapy Treatment  Patient Details  Name: Margaret Gates MRN: [id number] Date of Birth: 1980-11-27 Referring Provider (OT): Dr Earnest Rosier PA   Encounter Date: 12/28/2021   OT End of Session - 12/28/21 1448     Visit Number 5    Number of Visits 16    Date for OT Re-Evaluation 02/05/22    OT Start Time 1445    OT Stop Time 1530    OT Time Calculation (min) 45 min    Activity Tolerance Patient tolerated treatment well    Behavior During Therapy [facility] for tasks assessed/performed             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 ([place])    Asthma    Bronchitis    GERD (gastroesophageal reflux disease)    Headache    Severe sepsis ([place]) 01/26/2021   Tobacco use     Past Surgical History:  Procedure Laterality Date   CARPAL TUNNEL RELEASE Right 08/31/2020   Procedure: CARPAL TUNNEL RELEASE ENDOSCOPIC;  Surgeon: Corky Mull, MD;  Location: [facility];  Service: Orthopedics;  Laterality: Right;   CARPAL TUNNEL RELEASE Left 10/26/2020   Procedure: CARPAL TUNNEL RELEASE ENDOSCOPIC;  Surgeon: Corky Mull, MD;  Location: [facility];  Service: Orthopedics;  Laterality: Left;   CARPAL TUNNEL RELEASE Right 08/22/2021   Procedure: OPEN REVISION RIGHT CARPAL TUNNEL RELEASE;  Surgeon: Corky Mull, MD;  Location: [facility];  Service: Orthopedics;  Laterality: Right;   CARPAL TUNNEL RELEASE Left 11/15/2021   Procedure: Revision open left carpal tunnel release with subcutaneous transposition of ulnar nerve at left elbow;  Surgeon: Corky Mull, MD;  Location: [facility];  Service: Orthopedics;  Laterality: Left;   CESAREAN SECTION     HIP SURGERY Bilateral    LAPROSCOPIC BONE SPURS   HIP SURGERY Bilateral    ULNAR NERVE TRANSPOSITION Right 08/22/2021    Procedure: SUBCUTANEOUS TRANSPOSITION OF THE ULNAR NERVE AT RIGHT ELBOW.;  Surgeon: Corky Mull, MD;  Location: [facility];  Service: Orthopedics;  Laterality: Right;   ULNAR NERVE TRANSPOSITION Left 11/15/2021   Procedure: ULNAR NERVE DECOMPRESSION/TRANSPOSITION;  Surgeon: Corky Mull, MD;  Location: [facility];  Service: Orthopedics;  Laterality: Left;   UPPER GI ENDOSCOPY  2020    There were no vitals filed for this visit.   Subjective Assessment - 12/28/21 1449     Subjective  Pt reports she has woken up with bilateral elbow stiffness and achiness for the last 2 days.  Reports some numbness in middle ring and small when she woke up but when moving around it resolved.    Pertinent History Seen 11/27/21 by PA - Margaret Gates - Margaret Gates is a 41 y.o. female who presents today for her first postop appointment following a open carpal tunnel release of the left wrist due to recurrent left carpal tunnel syndrome in addition to a subcutaneous anterior transposition of the ulnar nerve at the left elbow. Surgery was performed by Margaret Gates on 11/15/2021. The patient does still report moderate discomfort in the left arm however she does feel that she is doing well. She denies any trauma or injury affecting the left upper extremity since surgery. She denies any signs of infection at home such as fevers chills or drainage from  the left elbow or wrist incision sites. The patient reports a 7 out of 10 pain score at today's visit, the patient was given a refill of oxycodone by Margaret Gates earlier today. 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    Currently in Pain? Yes    Pain Score 2     Pain Location Elbow    Pain Orientation Left    Pain Descriptors / Indicators Tender;Tightness    Pain Type Surgical pain    Pain Onset  1 to 4 weeks ago    Pain Frequency Intermittent            Paraffin this date to left wrist and hand to decrease pain, increase tissue mobility and increase ROM, 8 mins.  Skin inspected prior to and following treatment, skin intact and no issues.   Manual Therapy:   Following paraffin, pt seen for manual therapy skills with therapist performing soft tissue scar massage utilizing Cica -Care scar pad at proximal scar at cubital tunnel and over CT, use of scar pad for improved toleration of scar massage, manual at elbow. Metacarpal and carpal, CT spreads performed by OT 10 reps each prior to [facility] for thumb PA and RA   Therapeutic Exercise: Patient demonstrating full elbow extension in all 3 positions with a slight pull less than 2/10 during session Elbow flexion and extension performed 3 positions in clinic for 10 reps each. Moist heat applied to elbow for 5 mins prior to ROM, Active range of motion for elbow flexion/ extension with pain less than 2/10 with palm up and thumb up 10 reps.  Pt performing against the wall, extending arm and elbow, hold for stretch. Gentle active assisted range of motion for wrist flexion extension( prayer stretch) - with focus by OT for PROM for wrist extension, cues for proper form and technique.   Pt seen for active range of motion for tendon glides.  10 reps pain-free Thumb palmar /radial abduction, opposition - alternating digits.  10 reps.  Individual tendon glides to cont with at home - and doing different signs with digits          OT Education - 12/28/21 1452     Education Details progress and changes to HEP    Person(s) Educated Patient    Methods Explanation;Demonstration;Tactile cues;Verbal cues;Handout    Comprehension Verbal cues required;Returned demonstration;Verbalized understanding              OT Short Term Goals - 12/11/21 2052       OT SHORT TERM GOAL #1   Title Patient to be independent in home program to decrease scar  tissue and pain with increased motion at L wrist and elbow flexion/ extension.    Baseline Pain at rest and with AROM 4/10 at the elbow and wrist 3/10 - tightness and very tender at elbow- elbow flexion 124 , ext -15, wrist ext 34, flexion 47    Time 3    Period Weeks    Status New    Target Date 01/01/22               OT Long Term Goals - 12/11/21 2053       OT LONG TERM GOAL #1   Title Hypersensitivity over both scars improved for  patient to tolerate different textures, clothing and using tools without increase symptoms.    Baseline Pt very tender over proximal elbow scar - tender for massage and textures- and tender with scar massage and ROM at volar wrist -difficulty with gripping objects    Time 5    Period Weeks    Status New    Target Date 01/15/22      OT LONG TERM GOAL #2   Title Left elbow and wrist active range of motion increased to within normal limits for patient to be able to push and pull door open and turn doorknob    Baseline decrease elbow and wrist ROM - and pain elbow 4/10 at rest and wrist 3/10 - can do over head sweater but not pressure or gripping    Time 6    Period Weeks    Status New    Target Date 01/22/22      OT LONG TERM GOAL #3   Title Right elbow and wrist strength improved for patient to be able to push and pull door as well as turn doorknob, carrying and lift pots and pans while cooking with no increase symptoms    Baseline 3 1/2 wks s/p - pain and tenderness 3-4/10 at rest -and numbness in 4th and 5th - decrease elbow and wrist flexion , ext    Time 8    Period Weeks    Status New    Target Date 02/05/22                   Plan - 12/28/21 1452     Clinical Impression Statement Patient presented for OT evaluation with a diagnosis of L non dominant hand-  cubital tunnel release as well as open carpal tunnel release on 11/15/2021.  Patient is 6 weeks postop. Pt presents with continued edema at elbow and wrist- and pain over scars  at wrist and elbow. Increased scar tissue with decreased wrist and elbow motion end range.Pt with numbness reported in 5th and 4th digit. Pain at rest decrease but increase with end range elbow extension  at prox scar at elbow and CT scar.  Pt progressing with motion, decreased pain at times but reports she woke up with bilateral elbow stiffness and achiness the last 2 days and feels she might have over done it with activity over the last week cleaning.  Continues to require cues at times for proper form and techique wiht exercises.  Tenderness with manual massage greater when elbow is extended than flexed this date.   Pt limited in use of L hand and arm in ADL's and IADL's - pt can benefit from skilled OT services to decrease edema , scar tissue and pain - and increase ROM and strength to return to prior level of function.    OT Occupational Profile and History Problem Focused Assessment - Including review of records relating to presenting problem    Occupational performance deficits (Please refer to evaluation for details): ADL's;IADL's;Rest and Sleep;Work;Play;Leisure;Social Participation    Body Structure / Function / Physical Skills ADL;Strength;Pain;UE functional use;ROM;IADL;Scar mobility;Sensation;Decreased knowledge of precautions;Edema    Rehab Potential Good    Clinical Decision Making Limited treatment options, no task modification necessary    Comorbidities Affecting Occupational Performance: May have comorbidities impacting occupational performance    Modification or Assistance to Complete Evaluation  No modification of tasks or assist necessary to complete eval    OT Frequency 2x / week    OT Duration 8 weeks  OT Treatment/Interventions Self-care/ADL training;Paraffin;Moist Heat;Fluidtherapy;Contrast Bath;Therapeutic exercise;Manual Therapy;Patient/family education;Passive range of motion;Scar mobilization;DME and/or AE instruction    Consulted and Agree with Plan of Care Patient              Patient will benefit from skilled therapeutic intervention in order to improve the following deficits and impairments:   Body Structure / Function / Physical Skills: ADL, Strength, Pain, UE functional use, ROM, IADL, Scar mobility, Sensation, Decreased knowledge of precautions, Edema       Visit Diagnosis: Muscle weakness (generalized)  Scar condition and fibrosis of skin  Pain in left elbow  Pain in left wrist  Stiffness of left wrist, not elsewhere classified    Problem List Patient Active Problem List   Diagnosis Date Noted   Cubital tunnel syndrome, right 08/18/2021   Bilateral thumb pain 04/03/2021   Adrenal insufficiency ([place]) 01/26/2021   Asthma 01/26/2021   GERD (gastroesophageal reflux disease) 01/26/2021   Tobacco use 01/26/2021   CAP (community acquired pneumonia) 01/26/2021   Carpal tunnel syndrome, right 08/19/2020   Tear of left acetabular labrum 05/06/2015   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 [place], [address] Phone: [phone]   Fax:  [phone]  Name: Tristyn Pharris MRN: [id number] Date of Birth: 05-16-1980

## 2022-01-02 ENCOUNTER — Ambulatory Visit: Payer: BLUE CROSS/BLUE SHIELD | Admitting: Occupational Therapy

## 2022-01-04 ENCOUNTER — Ambulatory Visit: Payer: BLUE CROSS/BLUE SHIELD | Admitting: Occupational Therapy

## 2022-01-09 ENCOUNTER — Ambulatory Visit: Payer: BLUE CROSS/BLUE SHIELD | Attending: Surgery | Admitting: Occupational Therapy

## 2022-01-09 DIAGNOSIS — M25632 Stiffness of left wrist, not elsewhere classified: Secondary | ICD-10-CM | POA: Insufficient documentation

## 2022-01-09 DIAGNOSIS — M6281 Muscle weakness (generalized): Secondary | ICD-10-CM | POA: Insufficient documentation

## 2022-01-09 DIAGNOSIS — L905 Scar conditions and fibrosis of skin: Secondary | ICD-10-CM | POA: Insufficient documentation

## 2022-01-09 DIAGNOSIS — M25532 Pain in left wrist: Secondary | ICD-10-CM | POA: Insufficient documentation

## 2022-01-09 DIAGNOSIS — M25522 Pain in left elbow: Secondary | ICD-10-CM | POA: Insufficient documentation

## 2022-01-09 NOTE — Therapy (Signed)
[place] [facility] [address] [place], [address] Phone: [phone]   Fax:  [phone]  Occupational Therapy Screen  Patient Details  Name: Margaret Gates MRN: [id number] Date of Birth: [birth year] Referring Provider (OT): Dr Earnest Rosier PA   Encounter Date: 01/09/2022   OT End of Session - 01/09/22 1506     Visit Number 0             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 ([place])    Asthma    Bronchitis    GERD (gastroesophageal reflux disease)    Headache    Severe sepsis ([place]) 01/26/2021   Tobacco use     Past Surgical History:  Procedure Laterality Date   CARPAL TUNNEL RELEASE Right 08/31/2020   Procedure: CARPAL TUNNEL RELEASE ENDOSCOPIC;  Surgeon: Corky Mull, MD;  Location: [facility];  Service: Orthopedics;  Laterality: Right;   CARPAL TUNNEL RELEASE Left 10/26/2020   Procedure: CARPAL TUNNEL RELEASE ENDOSCOPIC;  Surgeon: Corky Mull, MD;  Location: [facility];  Service: Orthopedics;  Laterality: Left;   CARPAL TUNNEL RELEASE Right 08/22/2021   Procedure: OPEN REVISION RIGHT CARPAL TUNNEL RELEASE;  Surgeon: Corky Mull, MD;  Location: [facility];  Service: Orthopedics;  Laterality: Right;   CARPAL TUNNEL RELEASE Left 11/15/2021   Procedure: Revision open left carpal tunnel release with subcutaneous transposition of ulnar nerve at left elbow;  Surgeon: Corky Mull, MD;  Location: [facility];  Service: Orthopedics;  Laterality: Left;   CESAREAN SECTION     HIP SURGERY Bilateral    LAPROSCOPIC BONE SPURS   HIP SURGERY Bilateral    ULNAR NERVE TRANSPOSITION Right 08/22/2021   Procedure: SUBCUTANEOUS TRANSPOSITION OF THE ULNAR NERVE AT RIGHT ELBOW.;  Surgeon: Corky Mull, MD;  Location: [facility];  Service: Orthopedics;  Laterality: Right;   ULNAR NERVE TRANSPOSITION Left 11/15/2021   Procedure: ULNAR NERVE DECOMPRESSION/TRANSPOSITION;  Surgeon:  Corky Mull, MD;  Location: [facility];  Service: Orthopedics;  Laterality: Left;   UPPER GI ENDOSCOPY  2020    There were no vitals filed for this visit.   Subjective Assessment - 01/09/22 1505     Subjective  I seen Dr. Roland Rack  since have seen you last time.  Doing better still little tender over the scar and elbow.  And then tightness at my carpal tunnel scar with tenderness.    Pertinent History Seen 11/27/21 by PA - Cameron Proud - Drucilla Schmidt Leinbach is a 42 y.o. female who presents today for her first postop appointment following a open carpal tunnel release of the left wrist due to recurrent left carpal tunnel syndrome in addition to a subcutaneous anterior transposition of the ulnar nerve at the left elbow. Surgery was performed by Dr. Roland Rack on 11/15/2021. The patient does still report moderate discomfort in the left arm however she does feel that she is doing well. She denies any trauma or injury affecting the left upper extremity since surgery. She denies any signs of infection at home such as fevers chills or drainage from the left elbow or wrist incision sites. The patient reports a 7 out of 10 pain score at today's visit, the patient was given a refill of oxycodone by Dr. Roland Rack earlier today. 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                [facility] OT Assessment - 01/09/22 0001       AROM   Left Elbow Flexion 145 (P)     Left Elbow Extension 0 (P)     Left Wrist Extension 70 Degrees (P)     Left Wrist Flexion 75 Degrees (P)     Left Wrist Radial Deviation 25 Degrees (P)     Left Wrist Ulnar Deviation 30 Degrees (P)       Strength   Right Hand Grip (lbs) 80 (P)     Right Hand Lateral Pinch 20 lbs (P)     Right Hand 3 Point Pinch 13 lbs (P)     Left Hand Grip (lbs) 59 (P)     Left Hand  Lateral Pinch 16 lbs (P)     Left Hand 3 Point Pinch 11 lbs (P)       Left Hand AROM   L Thumb Opposition to Index -- (P)    opposition to base of 5th             I got new insurance.  I did bring my card in for [place] today. Reassess patient's active range of motion as well as grip and prehension strength.  Great progress in elbow extension and flexion to within normal limits. Still some tenderness over the scar patient to continue with scar massage Carpal tunnel scar still tight and tender mostly distally.  Done some scar massage using mini massager and massage stone on volar hand and scar. Patient to continue with Cica -Care scar pad at night. Patient can do paraffin now.  Reviewed with patient using paraffin prior to scar massage. Also added and changed to table slides 20 reps for gentle wrist extension Continue with active assisted range of motion for wrist flexion and next attention and radial ulnar deviation Patient can start back with 1 pound weight for elbow as well as wrist in all planes if not can use 16 ounce hammer. 12 reps had no issues.                OT Education - 01/09/22 0945     Education Details progress and changes to HEP    Person(s) Educated Patient    Methods Explanation;Demonstration;Tactile cues;Verbal cues;Handout              OT Short Term Goals - 12/11/21 2052       OT SHORT TERM GOAL #1   Title Patient to be independent in home program to decrease scar tissue and pain with increased motion at L wrist and elbow flexion/ extension.    Baseline Pain at rest and with AROM 4/10 at the elbow and wrist 3/10 - tightness and very tender at elbow- elbow flexion 124 , ext -15, wrist ext 34, flexion 47    Time 3    Period Weeks    Status New    Target Date 01/01/22               OT Long Term Goals - 12/11/21 2053       OT LONG TERM GOAL #1   Title Hypersensitivity over both scars improved for patient to tolerate different  textures, clothing and using tools without increase symptoms.    Baseline Pt very tender over proximal elbow scar - tender for  massage and textures- and tender with scar massage and ROM at volar wrist -difficulty with gripping objects    Time 5    Period Weeks    Status New    Target Date 01/15/22      OT LONG TERM GOAL #2   Title Left elbow and wrist active range of motion increased to within normal limits for patient to be able to push and pull door open and turn doorknob    Baseline decrease elbow and wrist ROM - and pain elbow 4/10 at rest and wrist 3/10 - can do over head sweater but not pressure or gripping    Time 6    Period Weeks    Status New    Target Date 01/22/22      OT LONG TERM GOAL #3   Title Right elbow and wrist strength improved for patient to be able to push and pull door as well as turn doorknob, carrying and lift pots and pans while cooking with no increase symptoms    Baseline 3 1/2 wks s/p - pain and tenderness 3-4/10 at rest -and numbness in 4th and 5th - decrease elbow and wrist flexion , ext    Time 8    Period Weeks    Status New    Target Date 02/05/22                   Plan - 01/09/22 0945     Clinical Impression Statement Patient presented for OT evaluation with a diagnosis of L non dominant hand-  cubital tunnel release as well as open carpal tunnel release on 11/15/2021.  Patient is 6 weeks postop. Pt presents with continued edema at elbow and wrist- and pain over scars at wrist and elbow. Increased scar tissue with decreased wrist and elbow motion end range.Pt with numbness reported in 5th and 4th digit. Pain at rest decrease but increase with end range elbow extension  at prox scar at elbow and CT scar.  Pt progressing with motion, decreased pain at times but reports she woke up with bilateral elbow stiffness and achiness the last 2 days and feels she might have over done it with activity over the last week cleaning.  Continues to require cues  at times for proper form and techique wiht exercises.  Tenderness with manual massage greater when elbow is extended than flexed this date.   Pt limited in use of L hand and arm in ADL's and IADL's - pt can benefit from skilled OT services to decrease edema , scar tissue and pain - and increase ROM and strength to return to prior level of function.    OT Occupational Profile and History Problem Focused Assessment - Including review of records relating to presenting problem    Occupational performance deficits (Please refer to evaluation for details): ADL's;IADL's;Rest and Sleep;Work;Play;Leisure;Social Participation    Body Structure / Function / Physical Skills ADL;Strength;Pain;UE functional use;ROM;IADL;Scar mobility;Sensation;Decreased knowledge of precautions;Edema    Rehab Potential Good    Clinical Decision Making Limited treatment options, no task modification necessary    Comorbidities Affecting Occupational Performance: May have comorbidities impacting occupational performance    Modification or Assistance to Complete Evaluation  No modification of tasks or assist necessary to complete eval    OT Frequency 2x / week    OT Duration 8 weeks    OT Treatment/Interventions Self-care/ADL training;Paraffin;Moist Heat;Fluidtherapy;Contrast Bath;Therapeutic exercise;Manual Therapy;Patient/family education;Passive range of motion;Scar mobilization;DME and/or AE instruction    Consulted and Agree with  Plan of Care Patient             Patient will benefit from skilled therapeutic intervention in order to improve the following deficits and impairments:   Body Structure / Function / Physical Skills: ADL, Strength, Pain, UE functional use, ROM, IADL, Scar mobility, Sensation, Decreased knowledge of precautions, Edema       Visit Diagnosis: Muscle weakness (generalized)  Scar condition and fibrosis of skin  Pain in left elbow  Pain in left wrist  Stiffness of left wrist, not elsewhere  classified    Problem List Patient Active Problem List   Diagnosis Date Noted   Cubital tunnel syndrome, right 08/18/2021   Bilateral thumb pain 04/03/2021   Adrenal insufficiency ([place]) 01/26/2021   Asthma 01/26/2021   GERD (gastroesophageal reflux disease) 01/26/2021   Tobacco use 01/26/2021   CAP (community acquired pneumonia) 01/26/2021   Carpal tunnel syndrome, right 08/19/2020   Tear of left acetabular labrum 05/06/2015   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 [place], [address] Phone: [phone]   Fax:  [phone]  Name: Jarissa Sheriff MRN: [id number] Date of Birth: 24-Mar-1980

## 2022-01-11 ENCOUNTER — Ambulatory Visit: Payer: BLUE CROSS/BLUE SHIELD | Admitting: Occupational Therapy

## 2022-01-11 DIAGNOSIS — M6281 Muscle weakness (generalized): Secondary | ICD-10-CM

## 2022-01-11 DIAGNOSIS — M25632 Stiffness of left wrist, not elsewhere classified: Secondary | ICD-10-CM

## 2022-01-11 DIAGNOSIS — M25522 Pain in left elbow: Secondary | ICD-10-CM

## 2022-01-11 DIAGNOSIS — M25532 Pain in left wrist: Secondary | ICD-10-CM

## 2022-01-11 DIAGNOSIS — L905 Scar conditions and fibrosis of skin: Secondary | ICD-10-CM

## 2022-01-11 NOTE — Therapy (Signed)
[facility] [address] [place], [address] Phone: [phone]   Fax:  [phone]  Occupational Therapy Screen  Patient Details  Name: Margaret Gates MRN: [id number] Date of Birth: 09-May-1980 Referring Provider (OT): Dr Earnest Rosier PA   Encounter Date: 01/11/2022   OT End of Session - 01/11/22 1009     Visit Number 0    Number of Visits --    Date for OT Re-Evaluation --    Activity Tolerance Patient tolerated treatment well    Behavior During Therapy [facility] for tasks assessed/performed             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 ([place])    Asthma    Bronchitis    GERD (gastroesophageal reflux disease)    Headache    Severe sepsis ([place]) 01/26/2021   Tobacco use     Past Surgical History:  Procedure Laterality Date   CARPAL TUNNEL RELEASE Right 08/31/2020   Procedure: CARPAL TUNNEL RELEASE ENDOSCOPIC;  Surgeon: Corky Mull, MD;  Location: [facility];  Service: Orthopedics;  Laterality: Right;   CARPAL TUNNEL RELEASE Left 10/26/2020   Procedure: CARPAL TUNNEL RELEASE ENDOSCOPIC;  Surgeon: Corky Mull, MD;  Location: [facility];  Service: Orthopedics;  Laterality: Left;   CARPAL TUNNEL RELEASE Right 08/22/2021   Procedure: OPEN REVISION RIGHT CARPAL TUNNEL RELEASE;  Surgeon: Corky Mull, MD;  Location: [facility];  Service: Orthopedics;  Laterality: Right;   CARPAL TUNNEL RELEASE Left 11/15/2021   Procedure: Revision open left carpal tunnel release with subcutaneous transposition of ulnar nerve at left elbow;  Surgeon: Corky Mull, MD;  Location: [facility];  Service: Orthopedics;  Laterality: Left;   CESAREAN SECTION     HIP SURGERY Bilateral    LAPROSCOPIC BONE SPURS   HIP SURGERY Bilateral    ULNAR NERVE TRANSPOSITION Right 08/22/2021   Procedure: SUBCUTANEOUS TRANSPOSITION OF THE ULNAR NERVE AT RIGHT ELBOW.;  Surgeon: Corky Mull,  MD;  Location: [facility];  Service: Orthopedics;  Laterality: Right;   ULNAR NERVE TRANSPOSITION Left 11/15/2021   Procedure: ULNAR NERVE DECOMPRESSION/TRANSPOSITION;  Surgeon: Corky Mull, MD;  Location: [facility];  Service: Orthopedics;  Laterality: Left;   UPPER GI ENDOSCOPY  2020    There were no vitals filed for this visit.   Subjective Assessment - 01/11/22 1006     Subjective  My husband is looking into changing our insurance for me to be covered for my out of network providers    Pertinent History Seen 11/27/21 by PA - Cameron Proud - Annye Forrey Brassell is a 42 y.o. female who presents today for her first postop appointment following a open carpal tunnel release of the left wrist due to recurrent left carpal tunnel syndrome in addition to a subcutaneous anterior transposition of the ulnar nerve at the left elbow. Surgery was performed by Dr. Roland Rack on 11/15/2021. The patient does still report moderate discomfort in the left arm however she does feel that she is doing well. She denies any trauma or injury affecting the left upper extremity since surgery. She denies any signs of infection at home such as fevers chills or drainage from the left elbow or wrist incision sites. The patient reports a 7 out of 10 pain score at today's visit, the patient was given a refill of oxycodone by Dr. Roland Rack earlier today. 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    Currently in Pain? No/denies                         OT Treatments/Exercises (OP) - 01/11/22 0001       LUE Paraffin   Number Minutes Paraffin 8 Minutes    LUE Paraffin Location Hand;Wrist    Comments decrease scar tissue             I got new insurance this week. Per pt husband working on changing it so that therapy and her MD's will be  covered. Elbow ext, flexion WNL -slight pull at elbow Still some tenderness over the scar patient to continue with scar massage Carpal tunnel scar still tight and tender mostly distally.  Done some scar massage using mini massager and massage stone on volar hand and scar. Patient to continue with [employer] -[employer] scar pad at night. Patient can do paraffin now.  Reviewed with patient using paraffin prior to scar massage. Cont table slides 20 reps for gentle wrist extension Continue with active assisted range of motion for wrist flexion and next attention and radial ulnar deviation Reviewed 1 pound weight for elbow flexion as well as wrist in all planes if not can use 16 ounce hammer. 12 reps had no issues. Increase in few days to 2nd set if no increase symptoms - and then 3 sets  2 x day             OT Education - 01/11/22 1009     Education Details progress and changes to HEP    Person(s) Educated Patient    Methods Explanation;Demonstration;Tactile cues;Verbal cues;Handout    Comprehension Verbal cues required;Returned demonstration;Verbalized understanding              OT Short Term Goals - 12/11/21 2052       OT SHORT TERM GOAL #1   Title Patient to be independent in home program to decrease scar tissue and pain with increased motion at L wrist and elbow flexion/ extension.    Baseline Pain at rest and with AROM 4/10 at the elbow and wrist 3/10 - tightness and very tender at elbow- elbow flexion 124 , ext -15, wrist ext 34, flexion 47    Time 3    Period Weeks    Status New    Target Date 01/01/22               OT Long Term Goals - 12/11/21 2053       OT LONG TERM GOAL #1   Title Hypersensitivity over both scars improved for patient to tolerate different textures, clothing and using tools without increase symptoms.    Baseline Pt very tender over proximal elbow scar - tender for massage and textures- and tender with scar massage and ROM at volar wrist -difficulty  with gripping objects    Time 5    Period Weeks    Status New    Target Date 01/15/22      OT LONG TERM GOAL #2   Title Left elbow and wrist active range of motion increased to within normal limits for patient to be able to push and pull door open and turn doorknob    Baseline decrease elbow and wrist ROM -  and pain elbow 4/10 at rest and wrist 3/10 - can do over head sweater but not pressure or gripping    Time 6    Period Weeks    Status New    Target Date 01/22/22      OT LONG TERM GOAL #3   Title Right elbow and wrist strength improved for patient to be able to push and pull door as well as turn doorknob, carrying and lift pots and pans while cooking with no increase symptoms    Baseline 3 1/2 wks s/p - pain and tenderness 3-4/10 at rest -and numbness in 4th and 5th - decrease elbow and wrist flexion , ext    Time 8    Period Weeks    Status New    Target Date 02/05/22                    Patient will benefit from skilled therapeutic intervention in order to improve the following deficits and impairments:           Visit Diagnosis: Muscle weakness (generalized)  Scar condition and fibrosis of skin  Pain in left elbow  Pain in left wrist  Stiffness of left wrist, not elsewhere classified    Problem List Patient Active Problem List   Diagnosis Date Noted   Cubital tunnel syndrome, right 08/18/2021   Bilateral thumb pain 04/03/2021   Adrenal insufficiency ([place]) 01/26/2021   Asthma 01/26/2021   GERD (gastroesophageal reflux disease) 01/26/2021   Tobacco use 01/26/2021   CAP (community acquired pneumonia) 01/26/2021   Carpal tunnel syndrome, right 08/19/2020   Tear of left acetabular labrum 05/06/2015   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 [place], [address] Phone: [phone]   Fax:  [phone]  Name: Aseneth Hack MRN: [id number] Date of Birth: 09-27-1980

## 2022-01-15 ENCOUNTER — Encounter: Payer: BC Managed Care – PPO | Admitting: Occupational Therapy

## 2022-01-18 ENCOUNTER — Encounter: Payer: BC Managed Care – PPO | Admitting: Occupational Therapy

## 2022-02-08 ENCOUNTER — Ambulatory Visit: Payer: Self-pay | Admitting: Nurse Practitioner

## 2022-02-08 ENCOUNTER — Ambulatory Visit: Payer: Self-pay

## 2022-02-08 ENCOUNTER — Encounter: Payer: Self-pay | Admitting: Nurse Practitioner

## 2022-02-08 ENCOUNTER — Other Ambulatory Visit: Payer: Self-pay

## 2022-02-08 VITALS — BP 124/82 | HR 104 | Temp 98.8°F | Resp 18 | Ht 65.0 in | Wt 187.7 lb

## 2022-02-08 DIAGNOSIS — E2740 Unspecified adrenocortical insufficiency: Secondary | ICD-10-CM

## 2022-02-08 DIAGNOSIS — Z8616 Personal history of COVID-19: Secondary | ICD-10-CM

## 2022-02-08 DIAGNOSIS — J069 Acute upper respiratory infection, unspecified: Secondary | ICD-10-CM

## 2022-02-08 HISTORY — DX: Personal history of COVID-19: Z86.16

## 2022-02-08 LAB — POCT INFLUENZA A/B
Influenza A, POC: NEGATIVE
Influenza B, POC: NEGATIVE

## 2022-02-08 MED ORDER — PROMETHAZINE-DM 6.25-15 MG/5ML PO SYRP: 5.0000 mL | ORAL_SOLUTION | Freq: Four times a day (QID) | ORAL | 0 refills | Status: DC | PRN

## 2022-02-08 NOTE — Assessment & Plan Note (Signed)
Continue taking cortef 15 mg daily, florinef 0.1 mg daily, and dexamethasone 0.5 mg daily.

## 2022-02-08 NOTE — Telephone Encounter (Signed)
Message from Luciana Axe sent at 02/08/2022  8:22 AM EST  Summary: Diarrhea & Temperature advice   Pt is calling to report temperature 101-102, with diarrhea, with vomiting not eating for 3-4, chills, body aches, cough & Chest hurting from coughing since Monday. Please advise         Chief Complaint: cough, fever, nausea Symptoms: chest soreness from coughing, nasal congestion (nose blocked), headache, body aches Frequency: Monday  Pertinent Negatives: Patient denies SOB Disposition: '[]'$ ED /'[]'$ Urgent Care (no appt availability in office) / '[x]'$ Appointment(In office/virtual)/ '[]'$  [facility]/ '[]'$ Home Care/ '[]'$ Refused Recommended Disposition /'[]'$ [facility]/ '[]'$  Follow-up with PCP Additional Notes: in office appt with PCP  Reason for Disposition . Fever present > 3 days (72 hours)  Answer Assessment - Initial Assessment Questions 1. WORST SYMPTOM: "What is your worst symptom?" (e.g., cough, runny nose, muscle aches, headache, sore throat, fever)      Cough fever nausea, nasal congestion 2. ONSET: "When did your flu symptoms start?"      Monday  3. COUGH: "How bad is the cough?"       Occasional cough  4. RESPIRATORY DISTRESS: "Describe your breathing."      Normal except nose mouth  5. FEVER: "Do you have a fever?" If Yes, ask: "What is your temperature, how was it measured, and when did it start?"     Yes 101-102 since Monday  6. EXPOSURE: "Were you exposed to someone with influenza?"       Possibly  7. FLU VACCINE: "Did you get a flu shot this year?"     yes 8. HIGH RISK DISEASE: "Do you have any chronic medical problems?" (e.g., heart or lung disease, asthma, weak immune system, or other HIGH RISK conditions)     Adrenal insuffiencey, asthma  9. PREGNANCY: "Is there any chance you are pregnant?" "When was your last menstrual period?"     N/a 10. OTHER SYMPTOMS: "Do you have any other symptoms?"  (e.g., runny nose, muscle aches, headache, sore throat)        Runny nose, clear, last vomited Tuesday, nauseated , prod cough with phelgm  Protocols used: Influenza (Flu) - [facility]

## 2022-02-08 NOTE — Progress Notes (Signed)
BP 124/82   Pulse (!) 104   Temp 98.8 F (37.1 C) (Oral)   Resp 18   Ht '5\' 5"'$  (1.651 m)   Wt 187 lb 11.2 oz (85.1 kg)   SpO2 97%   BMI 31.23 kg/m    Subjective:    Patient ID: Margaret Gates, female    DOB: 11-05-1980, 42 y.o.   MRN: [id number]  HPI: Margaret Gates is a 42 y.o. female  Chief Complaint  Patient presents with   URI    Cough, chills, fever, congested for 4 days   Emesis   URI: she says on Monday she started vomiting. She says then it preceded to have fever, cough, nasal congestion.  She does work in the school system.  She says that she has been around a lot of sick kids, one did test positive for covid.  She says she has been taking her steroids.  She says that she has been taking over the counter cold medication.  She has been pushing fluids. Flu test was negative, covid pending.  Discussed antiviral treatment for covid and side effects.  She would like to take the treatment if positive.   Recommend taking zyrtec, flonase, mucinex, vitamin d, vitamin c, and zinc. Push fluids and get rest.     Adrenal insufficiency: patient reports she is taking her steroids as instructed by her endocrinologist.    Relevant past medical, surgical, family and social history reviewed and updated as indicated. Interim medical history since our last visit reviewed. Allergies and medications reviewed and updated.  Review of Systems  Constitutional: positive for fever , negative weight change.  HEENT: positive for nasal congestion Respiratory: positive for cough and negative for shortness of breath.   Cardiovascular: Negative for chest pain or palpitations.  Gastrointestinal: Negative for abdominal pain, no bowel changes.  Musculoskeletal: Negative for gait problem or joint swelling.  Skin: Negative for rash.  Neurological: Negative for dizziness or headache.  No other specific complaints in a complete review of systems (except as listed in HPI above).       Objective:    BP 124/82   Pulse (!) 104   Temp 98.8 F (37.1 C) (Oral)   Resp 18   Ht '5\' 5"'$  (1.651 m)   Wt 187 lb 11.2 oz (85.1 kg)   SpO2 97%   BMI 31.23 kg/m   Wt Readings from Last 3 Encounters:  02/08/22 187 lb 11.2 oz (85.1 kg)  11/15/21 187 lb (84.8 kg)  11/07/21 187 lb (84.8 kg)    Physical Exam  Constitutional: Patient appears well-developed and well-nourished. Obese  No distress.  HEENT: head atraumatic, normocephalic, pupils equal and reactive to light, ears Tms clear, neck supple, throat within normal limits, no lymphadenopathy Cardiovascular: Normal rate, regular rhythm and normal heart sounds.  No murmur heard. No BLE edema. Pulmonary/Chest: Effort normal and breath sounds normal. No respiratory distress. Abdominal: Soft.  There is no tenderness. Psychiatric: Patient has a normal mood and affect. behavior is normal. Judgment and thought content normal.  Results for orders placed or performed in visit on 02/08/22  POCT Influenza A/B  Result Value Ref Range   Influenza A, POC Negative Negative   Influenza B, POC Negative Negative      Assessment & Plan:   Problem List Items Addressed This Visit       Endocrine   Adrenal insufficiency ([place])    Continue taking cortef 15 mg daily, florinef 0.1 mg daily, and dexamethasone  0.5 mg daily.       Other Visit Diagnoses     Viral upper respiratory tract infection    -  Primary   start phenergan-DM,Recommend taking zyrtec, flonase, mucinex, vitamin d, vitamin c, and zinc. Push fluids and get rest.   Relevant Medications   promethazine-dextromethorphan (PROMETHAZINE-DM) 6.25-15 MG/5ML syrup   Other Relevant Orders   POCT Influenza A/B (Completed)   Novel Coronavirus, NAA ([facility])        Follow up plan: Return if symptoms worsen or fail to improve.

## 2022-02-09 ENCOUNTER — Other Ambulatory Visit: Payer: Self-pay | Admitting: Nurse Practitioner

## 2022-02-09 DIAGNOSIS — U071 COVID-19: Secondary | ICD-10-CM

## 2022-02-09 LAB — NOVEL CORONAVIRUS, NAA: SARS-CoV-2, NAA: DETECTED — AB

## 2022-02-09 MED ORDER — NIRMATRELVIR/RITONAVIR (PAXLOVID)TABLET: 3.0000 | ORAL_TABLET | Freq: Two times a day (BID) | ORAL | 0 refills | Status: AC

## 2022-02-12 ENCOUNTER — Other Ambulatory Visit: Payer: Self-pay | Admitting: Nurse Practitioner

## 2022-02-13 ENCOUNTER — Other Ambulatory Visit: Payer: Self-pay

## 2022-02-13 MED ORDER — ALBUTEROL SULFATE HFA 108 (90 BASE) MCG/ACT IN AERS
2.0000 | INHALATION_SPRAY | Freq: Four times a day (QID) | RESPIRATORY_TRACT | 3 refills | Status: DC | PRN
  Filled 2022-02-13: qty 6.7, 25d supply, fill #0

## 2022-02-16 ENCOUNTER — Encounter: Payer: Self-pay | Admitting: Nurse Practitioner

## 2022-02-19 DIAGNOSIS — G5602 Carpal tunnel syndrome, left upper limb: Secondary | ICD-10-CM | POA: Diagnosis not present

## 2022-02-19 DIAGNOSIS — Z9889 Other specified postprocedural states: Secondary | ICD-10-CM | POA: Diagnosis not present

## 2022-02-19 DIAGNOSIS — G5622 Lesion of ulnar nerve, left upper limb: Secondary | ICD-10-CM | POA: Diagnosis not present

## 2022-02-27 ENCOUNTER — Other Ambulatory Visit: Payer: Self-pay

## 2022-04-19 DIAGNOSIS — E259 Adrenogenital disorder, unspecified: Secondary | ICD-10-CM | POA: Diagnosis not present

## 2022-04-19 DIAGNOSIS — E236 Other disorders of pituitary gland: Secondary | ICD-10-CM | POA: Diagnosis not present

## 2022-04-19 DIAGNOSIS — D3501 Benign neoplasm of right adrenal gland: Secondary | ICD-10-CM | POA: Diagnosis not present

## 2022-04-30 DIAGNOSIS — E259 Adrenogenital disorder, unspecified: Secondary | ICD-10-CM | POA: Diagnosis not present

## 2022-08-24 ENCOUNTER — Encounter: Payer: Self-pay | Admitting: Nurse Practitioner

## 2022-09-17 NOTE — Progress Notes (Unsigned)
Name: Margaret Gates   MRN: [id number]    DOB: 1980-02-07   Date:09/19/2022       Progress Note  Subjective  Chief Complaint  Chief Complaint  Patient presents with  . Annual Exam    HPI  Patient presents for annual CPE and acute issue  Low back pain with sciatica: patient reports she has been having some back pain over the last few weeks. She says she has taken tylenol and ibuprofen with no relief. She says the pain starts in the right side of her back and radiates down her leg. She denies any trauma, incontinence.  Will start naproxen, robaxin and steroid taper. Can also do heat therapy.  Back strengthening exercises attached.   Diet: well balanced diet Exercise: exercises daily, works two jobs  Last [employer] Exam: long time ago Last Dental Exam: due   Garment/textile technologist Visit from 02/08/2022 in [facility]  AUDIT-C Score 0      Depression: Phq 9 is  negative    09/19/2022    9:03 AM 02/08/2022   11:41 AM 09/18/2021   10:24 AM 03/14/2021    8:27 AM 02/24/2021    2:37 PM  Depression screen PHQ 2/9  Decreased Interest 0 0 0 0 0  Down, Depressed, Hopeless 0 0 0 0 0  PHQ - 2 Score 0 0 0 0 0  Altered sleeping 0   0 0  Tired, decreased energy 0   0 0  Change in appetite 0   0 0  Feeling bad or failure about yourself  0   0 0  Trouble concentrating 0   0 0  Moving slowly or fidgety/restless 0   0 0  Suicidal thoughts 0   0 0  PHQ-9 Score 0   0 0  Difficult doing work/chores Not difficult at all   Not difficult at all Not difficult at all   Hypertension: BP Readings from Last 3 Encounters:  09/19/22 108/72  02/08/22 124/82  11/15/21 125/87   Obesity: Wt Readings from Last 3 Encounters:  09/19/22 180 lb 11.2 oz (82 kg)  02/08/22 187 lb 11.2 oz (85.1 kg)  11/15/21 187 lb (84.8 kg)   BMI Readings from Last 3 Encounters:  09/19/22 30.07 kg/m  02/08/22 31.23 kg/m  11/15/21 30.18 kg/m     Vaccines:  HPV: up to at age 43 , ask insurance  if age between 50-45  Shingrix: 43-64 yo and ask insurance if covered when patient above 81 yo Pneumonia:  educated and discussed with patient. Flu:  educated and discussed with patient.     Hep C Screening: completed STD testing and prevention (HIV/chl/gon/syphilis): completed Intimate partner violence: negative screen  Sexual History : yes Menstrual History/LMP/Abnormal Bleeding: [facility]: 2 months ago, tubal ligation Discussed importance of follow up if any post-menopausal bleeding: yes  Incontinence Symptoms: negative for symptoms   Breast cancer:  - Last Mammogram:previously ordered but never done, due - BRCA gene screening: none   Osteoporosis Prevention : Discussed high calcium and vitamin D supplementation, weight bearing exercises Bone density :not applicable   Cervical cancer screening: 09/18/2021  Skin cancer: Discussed monitoring for atypical lesions  Colorectal cancer: no concerns, not due   Lung cancer:  Low Dose CT Chest recommended if Age 43-80 years, 20 pack-year currently smoking OR have quit w/in 15years. Patient does not qualify for screen   ECG: 01/30/2021  Advanced Care Planning: A voluntary discussion about advance care planning including the  explanation and discussion of advance directives.  Discussed health care proxy and Living will, and the patient was able to identify a health care proxy as husband.  Patient does not have a living will and power of attorney of health care   Lipids: Lab Results  Component Value Date   CHOL 237 (H) 09/18/2021   CHOL 220 (H) 02/24/2021   Lab Results  Component Value Date   HDL 55 09/18/2021   HDL 50 02/24/2021   Lab Results  Component Value Date   LDLCALC 157 (H) 09/18/2021   LDLCALC 139 (H) 02/24/2021   Lab Results  Component Value Date   TRIG 128 09/18/2021   TRIG 174 (H) 02/24/2021   Lab Results  Component Value Date   CHOLHDL 4.3 09/18/2021   CHOLHDL 4.4 02/24/2021   No results found for:  "LDLDIRECT"  Glucose: Glucose, Bld  Date Value Ref Range Status  09/18/2021 72 65 - 99 mg/dL Final    Comment:    .            Fasting reference interval .   02/24/2021 88 65 - 99 mg/dL Final    Comment:    .            Fasting reference interval .   01/28/2021 119 (H) 70 - 99 mg/dL Final    Comment:    Glucose reference range applies only to samples taken after fasting for at least 8 hours.   Glucose-Capillary  Date Value Ref Range Status  01/28/2021 116 (H) 70 - 99 mg/dL Final    Comment:    Glucose reference range applies only to samples taken after fasting for at least 8 hours.  01/27/2021 110 (H) 70 - 99 mg/dL Final    Comment:    Glucose reference range applies only to samples taken after fasting for at least 8 hours.    Patient Active Problem List   Diagnosis Date Noted  . Cubital tunnel syndrome, right 08/18/2021  . Bilateral thumb pain 04/03/2021  . Adrenal insufficiency (HCC) 01/26/2021  . Asthma 01/26/2021  . GERD (gastroesophageal reflux disease) 01/26/2021  . Tobacco use 01/26/2021  . CAP (community acquired pneumonia) 01/26/2021  . Carpal tunnel syndrome, right 08/19/2020  . Tear of left acetabular labrum 05/06/2015  . 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 Nicotine dependence, uncomplicated 01/22/2013    Past Surgical History:  Procedure Laterality Date  . CARPAL TUNNEL RELEASE Right 08/31/2020   Procedure: CARPAL TUNNEL RELEASE ENDOSCOPIC;  Surgeon: Christena Flake, MD;  Location: [facility] ORS;  Service: Orthopedics;  Laterality: Right;  . CARPAL TUNNEL RELEASE Left 10/26/2020   Procedure: CARPAL TUNNEL RELEASE ENDOSCOPIC;  Surgeon: Christena Flake, MD;  Location: [facility] ORS;  Service: Orthopedics;  Laterality: Left;  . CARPAL TUNNEL RELEASE Right 08/22/2021   Procedure: OPEN REVISION RIGHT CARPAL TUNNEL RELEASE;  Surgeon: Christena Flake, MD;  Location: [facility] ORS;  Service: Orthopedics;   Laterality: Right;  . CARPAL TUNNEL RELEASE Left 11/15/2021   Procedure: Revision open left carpal tunnel release with subcutaneous transposition of ulnar nerve at left elbow;  Surgeon: Christena Flake, MD;  Location: [facility] ORS;  Service: Orthopedics;  Laterality: Left;  . CESAREAN SECTION    . HIP SURGERY Bilateral    LAPROSCOPIC BONE SPURS  . HIP SURGERY Bilateral   . ULNAR NERVE TRANSPOSITION Right 08/22/2021   Procedure: SUBCUTANEOUS TRANSPOSITION OF THE ULNAR NERVE AT RIGHT ELBOW.;  Surgeon: Christena Flake, MD;  Location: [facility] ORS;  Service: Orthopedics;  Laterality: Right;  . ULNAR NERVE TRANSPOSITION Left 11/15/2021   Procedure: ULNAR NERVE DECOMPRESSION/TRANSPOSITION;  Surgeon: Christena Flake, MD;  Location: [facility] ORS;  Service: Orthopedics;  Laterality: Left;  . UPPER GI ENDOSCOPY  2020    Family History  Problem Relation Age of Onset  . Scoliosis Mother   . Hodgkin's lymphoma Father   . ADD / ADHD Brother     Social History   Socioeconomic History  . Marital status: Married    Spouse name: Not on file  . Number of children: 3  . Years of education: Not on file  . Highest education level: Not on file  Occupational History  . Not on file  Tobacco Use  . Smoking status: Some Days    Types: Cigars  . Smokeless tobacco: Never  Vaping Use  . Vaping status: Some Days  . Substances: Nicotine  Substance and Sexual Activity  . Alcohol use: Yes    Comment: Ocassionally  . Drug use: Never  . Sexual activity: Yes  Other Topics Concern  . Not on file  Social History Narrative  . Not on file   Social Determinants of Health   Financial Resource Strain: Low Risk  (09/19/2022)   Overall Financial Resource Strain (CARDIA)   . Difficulty of Paying Living Expenses: Not hard at all  Food Insecurity: No Food Insecurity (09/19/2022)   Hunger Vital Sign   . Worried About Programme researcher, broadcasting/film/video in the Last Year: Never true   . Ran Out of Food in the Last Year: Never true  Transportation  Needs: No Transportation Needs (09/19/2022)   PRAPARE - Transportation   . Lack of Transportation (Medical): No   . Lack of Transportation (Non-Medical): No  Physical Activity: Sufficiently Active (09/19/2022)   Exercise Vital Sign   . Days of Exercise per Week: 5 days   . Minutes of Exercise per Session: 30 min  Stress: No Stress Concern Present (09/19/2022)   [employer] of Occupational Health - Occupational Stress Questionnaire   . Feeling of Stress : Only a little  Social Connections: Socially Integrated (09/19/2022)   Social Connection and Isolation Panel [NHANES]   . Frequency of Communication with Friends and Family: More than three times a week   . Frequency of Social Gatherings with Friends and Family: More than three times a week   . Attends Religious Services: More than 4 times per year   . Active Member of Clubs or Organizations: Yes   . Attends Banker Meetings: 1 to 4 times per year   . Marital Status: Married  Catering manager Violence: Not At Risk (09/19/2022)   Humiliation, Afraid, Rape, and Kick questionnaire   . Fear of Current or Ex-Partner: No   . Emotionally Abused: No   . Physically Abused: No   . Sexually Abused: No     Current Outpatient Medications:  .  acetaminophen (TYLENOL) 500 MG tablet, Take 500-1,000 mg by mouth every 6 (six) hours as needed for moderate pain or headache., Disp: , Rfl:  .  albuterol (VENTOLIN HFA) 108 (90 Base) MCG/ACT inhaler, Inhale 2 puffs into the lungs every 6 (six) hours as needed for wheezing or shortness of breath., Disp: 6.7 g, Rfl: 3 .  fludrocortisone (FLORINEF) 0.1 MG tablet, Take 0.1 mg by mouth daily., Disp: , Rfl:  .  hydrocortisone (CORTEF) 10 MG tablet, Take 15 mg by mouth daily., Disp: ,  Rfl:  .  ibuprofen (ADVIL) 200 MG tablet, Take 400-800 mg by mouth every 8 (eight) hours as needed for headache or moderate pain., Disp: , Rfl:  .  methocarbamol (ROBAXIN) 500 MG tablet, Take 1 tablet (500 mg total) by  mouth 2 (two) times daily as needed for muscle spasms., Disp: 30 tablet, Rfl: 0 .  Multiple Vitamin (MULTIVITAMIN WITH MINERALS) TABS tablet, Take 1 tablet by mouth daily., Disp: , Rfl:  .  naproxen (NAPROSYN) 500 MG tablet, Take 1 tablet (500 mg total) by mouth 2 (two) times daily with a meal., Disp: 30 tablet, Rfl: 0 .  omeprazole (PRILOSEC OTC) 20 MG tablet, Take 20 mg by mouth daily., Disp: , Rfl:  .  predniSONE (STERAPRED UNI-PAK 21 TAB) 10 MG (21) TBPK tablet, Take as directed on package.  (60 mg po on day 1, 50 mg po on day 2...), Disp: 21 tablet, Rfl: 0 .  dexamethasone (DECADRON) 0.5 MG tablet, Take 0.25 mg by mouth at bedtime. (Patient not taking: Reported on 09/19/2022), Disp: , Rfl:  .  oxyCODONE (ROXICODONE) 5 MG immediate release tablet, Take 1-2 tablets (5-10 mg total) by mouth every 4 (four) hours as needed for moderate pain or severe pain. (Patient not taking: Reported on 09/19/2022), Disp: 40 tablet, Rfl: 0 .  promethazine-dextromethorphan (PROMETHAZINE-DM) 6.25-15 MG/5ML syrup, Take 5 mLs by mouth 4 (four) times daily as needed for cough. (Patient not taking: Reported on 09/19/2022), Disp: 180 mL, Rfl: 0  Allergies  Allergen Reactions  . Penicillins     Unknown reaction (CHILDHOOD) Tolerated 1st generation cephalosporin (CEFAZOLIN) on 03/23/2015, 06/20/2015, 08/31/2020 with no documented ADRs.   . Tizanidine Nausea Only  . Cyclobenzaprine Nausea Only     ROS  Constitutional: Negative for fever or weight change.  Respiratory: Negative for cough and shortness of breath.   Cardiovascular: Negative for chest pain or palpitations.  Gastrointestinal: Negative for abdominal pain, no bowel changes.  Musculoskeletal: Negative for gait problem or joint swelling. Positive low back pain Skin: Negative for rash.  Neurological: Negative for dizziness or headache.  No other specific complaints in a complete review of systems (except as listed in HPI above).   Objective  Vitals:    09/19/22 0912  BP: 108/72  Pulse: 97  Resp: 16  Temp: 98.2 F (36.8 C)  TempSrc: Oral  SpO2: 98%  Weight: 180 lb 11.2 oz (82 kg)  Height: 5\' 5"  (1.651 m)    Body mass index is 30.07 kg/m.  Physical Exam Constitutional: Patient appears well-developed and well-nourished. No distress.  HENT: Head: Normocephalic and atraumatic. Ears: B TMs ok, no erythema or effusion; Nose: Nose normal. Mouth/Throat: Oropharynx is clear and moist. No oropharyngeal exudate.  Eyes: Conjunctivae and EOM are normal. Pupils are equal, round, and reactive to light. No scleral icterus.  Neck: Normal range of motion. Neck supple. No JVD present. No thyromegaly present.  Cardiovascular: Normal rate, regular rhythm and normal heart sounds.  No murmur heard. No BLE edema. Pulmonary/Chest: Effort normal and breath sounds normal. No respiratory distress. Abdominal: Soft. Bowel sounds are normal, no distension. There is no tenderness. no masses Breast: no lumps or masses, no nipple discharge or rashes Musculoskeletal: Normal range of motion, no joint effusions. No gross deformities, tenderness right lower back Neurological: he is alert and oriented to person, place, and time. No cranial nerve deficit. Coordination, balance, strength, speech and gait are normal.  Skin: Skin is warm and dry. No rash noted. No erythema.  Psychiatric: Patient  has a normal mood and affect. behavior is normal. Judgment and thought content normal.   No results found for this or any previous visit (from the past 2160 hour(s)).   Fall Risk:    09/19/2022    9:03 AM 02/08/2022   11:41 AM 09/18/2021   10:24 AM 03/14/2021    8:26 AM 02/24/2021    2:36 PM  Fall Risk   Falls in the past year? 0 0 0 0 0  Number falls in past yr: 0 0 0 0 0  Injury with Fall? 0 0 0 0 0  Risk for fall due to : No Fall Risks   No Fall Risks No Fall Risks  Follow up Falls prevention discussed;Education provided;Falls evaluation completed  Falls evaluation completed  Falls prevention discussed Falls prevention discussed     Functional Status Survey: Is the patient deaf or have difficulty hearing?: No Does the patient have difficulty seeing, even when wearing glasses/contacts?: No Does the patient have difficulty concentrating, remembering, or making decisions?: Yes Does the patient have difficulty walking or climbing stairs?: No Does the patient have difficulty dressing or bathing?: No Does the patient have difficulty doing errands alone such as visiting a doctor's office or shopping?: No   Assessment & Plan  1. Annual physical exam  - CBC with Differential/Platelet - COMPLETE METABOLIC PANEL WITH GFR - Lipid panel - Hemoglobin A1c  2. Other hyperlipidemia  - Lipid panel  3. Anemia, unspecified type  - CBC with Differential/Platelet  4. Screening for diabetes mellitus  - COMPLETE METABOLIC PANEL WITH GFR - Hemoglobin A1c  5. Encounter for screening mammogram for malignant neoplasm of breast  - MM 3D SCREENING MAMMOGRAM BILATERAL BREAST; Future   -USPSTF grade A and B recommendations reviewed with patient; age-appropriate recommendations, preventive care, screening tests, etc discussed and encouraged; healthy living encouraged; see AVS for patient education given to patient -Discussed importance of 150 minutes of physical activity weekly, eat two servings of fish weekly, eat one serving of tree nuts ( cashews, pistachios, pecans, almonds.Marland Kitchen) every other day, eat 6 servings of fruit/vegetables daily and drink plenty of water and avoid sweet beverages.   -Reviewed Health Maintenance: Yes.

## 2022-09-19 ENCOUNTER — Encounter: Payer: Self-pay | Admitting: Nurse Practitioner

## 2022-09-19 ENCOUNTER — Ambulatory Visit: Payer: BC Managed Care – PPO | Admitting: Nurse Practitioner

## 2022-09-19 VITALS — BP 108/72 | HR 97 | Temp 98.2°F | Resp 16 | Ht 65.0 in | Wt 180.7 lb

## 2022-09-19 DIAGNOSIS — M5441 Lumbago with sciatica, right side: Secondary | ICD-10-CM | POA: Diagnosis not present

## 2022-09-19 DIAGNOSIS — E7849 Other hyperlipidemia: Secondary | ICD-10-CM | POA: Diagnosis not present

## 2022-09-19 DIAGNOSIS — Z0000 Encounter for general adult medical examination without abnormal findings: Secondary | ICD-10-CM

## 2022-09-19 DIAGNOSIS — D649 Anemia, unspecified: Secondary | ICD-10-CM | POA: Diagnosis not present

## 2022-09-19 DIAGNOSIS — Z1231 Encounter for screening mammogram for malignant neoplasm of breast: Secondary | ICD-10-CM | POA: Diagnosis not present

## 2022-09-19 DIAGNOSIS — Z131 Encounter for screening for diabetes mellitus: Secondary | ICD-10-CM

## 2022-09-19 DIAGNOSIS — Z23 Encounter for immunization: Secondary | ICD-10-CM | POA: Diagnosis not present

## 2022-09-19 MED ORDER — PREDNISONE 10 MG (21) PO TBPK
ORAL_TABLET | ORAL | 0 refills | Status: DC
Start: 2022-09-19 — End: 2022-12-10

## 2022-09-19 MED ORDER — NAPROXEN 500 MG PO TABS
500.0000 mg | ORAL_TABLET | Freq: Two times a day (BID) | ORAL | 0 refills | Status: DC
Start: 2022-09-19 — End: 2022-10-11

## 2022-09-19 MED ORDER — METHOCARBAMOL 500 MG PO TABS
500.0000 mg | ORAL_TABLET | Freq: Two times a day (BID) | ORAL | 0 refills | Status: DC | PRN
Start: 2022-09-19 — End: 2022-12-10

## 2022-09-20 LAB — CBC WITH DIFFERENTIAL/PLATELET
Absolute Monocytes: 749 cells/uL (ref 200–950)
Basophils Absolute: 58 cells/uL (ref 0–200)
Basophils Relative: 0.8 %
Eosinophils Absolute: 583 cells/uL — ABNORMAL HIGH (ref 15–500)
Eosinophils Relative: 8.1 %
HCT: 43.1 % (ref 35.0–45.0)
Hemoglobin: 13.9 g/dL (ref 11.7–15.5)
Lymphs Abs: 2578 cells/uL (ref 850–3900)
MCH: 32.7 pg (ref 27.0–33.0)
MCHC: 32.3 g/dL (ref 32.0–36.0)
MCV: 101.4 fL — ABNORMAL HIGH (ref 80.0–100.0)
MPV: 10.2 fL (ref 7.5–12.5)
Monocytes Relative: 10.4 %
Neutro Abs: 3233 cells/uL (ref 1500–7800)
Neutrophils Relative %: 44.9 %
Platelets: 336 10*3/uL (ref 140–400)
RBC: 4.25 10*6/uL (ref 3.80–5.10)
RDW: 11.6 % (ref 11.0–15.0)
Total Lymphocyte: 35.8 %
WBC: 7.2 10*3/uL (ref 3.8–10.8)

## 2022-09-20 LAB — COMPLETE METABOLIC PANEL WITH GFR
AG Ratio: 2.2 (calc) (ref 1.0–2.5)
ALT: 15 U/L (ref 6–29)
AST: 11 U/L (ref 10–30)
Albumin: 4.7 g/dL (ref 3.6–5.1)
Alkaline phosphatase (APISO): 61 U/L (ref 31–125)
BUN: 11 mg/dL (ref 7–25)
CO2: 25 mmol/L (ref 20–32)
Calcium: 9.8 mg/dL (ref 8.6–10.2)
Chloride: 105 mmol/L (ref 98–110)
Creat: 0.72 mg/dL (ref 0.50–0.99)
Globulin: 2.1 g/dL (calc) (ref 1.9–3.7)
Glucose, Bld: 83 mg/dL (ref 65–99)
Potassium: 3.9 mmol/L (ref 3.5–5.3)
Sodium: 139 mmol/L (ref 135–146)
Total Bilirubin: 0.5 mg/dL (ref 0.2–1.2)
Total Protein: 6.8 g/dL (ref 6.1–8.1)
eGFR: 106 mL/min/{1.73_m2} (ref >=60)

## 2022-09-20 LAB — LIPID PANEL
Cholesterol: 215 mg/dL — ABNORMAL HIGH (ref <200)
HDL: 56 mg/dL (ref >=50)
LDL Cholesterol (Calc): 132 mg/dL (calc) — ABNORMAL HIGH
Non-HDL Cholesterol (Calc): 159 mg/dL (calc) — ABNORMAL HIGH (ref <130)
Total CHOL/HDL Ratio: 3.8 (calc) (ref <5.0)
Triglycerides: 158 mg/dL — ABNORMAL HIGH (ref <150)

## 2022-09-20 LAB — HEMOGLOBIN A1C
Hgb A1c MFr Bld: 5.4 % of total Hgb (ref <5.7)
Mean Plasma Glucose: 108 mg/dL
eAG (mmol/L): 6 mmol/L

## 2022-10-01 ENCOUNTER — Other Ambulatory Visit: Payer: Self-pay | Admitting: Nurse Practitioner

## 2022-10-01 DIAGNOSIS — M5441 Lumbago with sciatica, right side: Secondary | ICD-10-CM

## 2022-10-02 NOTE — Telephone Encounter (Signed)
Requested by interface surescripts. Last refill 09/19/22. Duplicate request. Requested Prescriptions  Refused Prescriptions Disp Refills   naproxen (NAPROSYN) 500 MG tablet [Pharmacy Med Name: NAPROXEN 500 MG TABLET] 30 tablet 0    Sig: TAKE 1 TABLET BY MOUTH 2 TIMES DAILY WITH A MEAL.     Analgesics:  NSAIDS Failed - 10/01/2022  2:41 PM      Failed - Manual Review: Labs are only required if the patient has taken medication for more than 8 weeks.      Passed - Cr in normal range and within 360 days    Creat  Date Value Ref Range Status  09/19/2022 0.72 0.50 - 0.99 mg/dL Final         Passed - HGB in normal range and within 360 days    Hemoglobin  Date Value Ref Range Status  09/19/2022 13.9 11.7 - 15.5 g/dL Final   HGB  Date Value Ref Range Status  07/22/2013 14.7 12.0 - 16.0 g/dL Final         Passed - PLT in normal range and within 360 days    Platelets  Date Value Ref Range Status  09/19/2022 336 140 - 400 Thousand/uL Final   Platelet  Date Value Ref Range Status  07/22/2013 253 150 - 440 x10 3/mm 3 Final         Passed - HCT in normal range and within 360 days    HCT  Date Value Ref Range Status  09/19/2022 43.1 35.0 - 45.0 % Final  07/22/2013 45.1 35.0 - 47.0 % Final         Passed - eGFR is 30 or above and within 360 days    GFR calc Af Amer  Date Value Ref Range Status  01/03/2017 >60 >60 mL/min Final    Comment:    (NOTE) The eGFR has been calculated using the CKD EPI equation. This calculation has not been validated in all clinical situations. eGFR's persistently <60 mL/min signify possible Chronic Kidney Disease.    GFR, Estimated  Date Value Ref Range Status  01/28/2021 >60 >60 mL/min Final    Comment:    (NOTE) Calculated using the CKD-EPI Creatinine Equation (2021)    eGFR  Date Value Ref Range Status  09/19/2022 106 > OR = 60 mL/min/1.66m2 Final         Passed - Patient is not pregnant      Passed - Valid encounter within last 12  months    Recent Outpatient Visits           1 week ago Annual physical exam   [facility] Berniece Salines, FNP   7 months ago Viral upper respiratory tract infection   [facility] Berniece Salines, FNP   1 year ago Annual physical exam   [facility] Berniece Salines, FNP   1 year ago Enlarged lymph node   [facility] Caro Laroche, DO   1 year ago History of Community acquired pneumonia, unspecified laterality   [facility] Berniece Salines, [place]

## 2022-10-02 NOTE — Telephone Encounter (Signed)
Called pharmacy to verify patient picked up Rx refill. Pharmacy staff reports patient picked up medication on 09/19/22. Duplicate requested

## 2022-10-11 ENCOUNTER — Other Ambulatory Visit: Payer: Self-pay | Admitting: Nurse Practitioner

## 2022-10-11 DIAGNOSIS — M5441 Lumbago with sciatica, right side: Secondary | ICD-10-CM

## 2022-10-11 MED ORDER — NAPROXEN 500 MG PO TABS
500.0000 mg | ORAL_TABLET | Freq: Two times a day (BID) | ORAL | 1 refills | Status: DC
Start: 2022-10-11 — End: 2022-12-10

## 2022-10-17 DIAGNOSIS — D3501 Benign neoplasm of right adrenal gland: Secondary | ICD-10-CM | POA: Diagnosis not present

## 2022-10-17 DIAGNOSIS — E236 Other disorders of pituitary gland: Secondary | ICD-10-CM | POA: Diagnosis not present

## 2022-10-17 DIAGNOSIS — E259 Adrenogenital disorder, unspecified: Secondary | ICD-10-CM | POA: Diagnosis not present

## 2022-11-27 ENCOUNTER — Ambulatory Visit
Admission: RE | Admit: 2022-11-27 | Discharge: 2022-11-27 | Disposition: A | Discharge status: Home / Self Care | Payer: 59 | Source: Ambulatory Visit | Attending: Nurse Practitioner | Admitting: Nurse Practitioner

## 2022-11-27 DIAGNOSIS — Z1231 Encounter for screening mammogram for malignant neoplasm of breast: Secondary | ICD-10-CM | POA: Insufficient documentation

## 2022-12-10 ENCOUNTER — Ambulatory Visit (INDEPENDENT_AMBULATORY_CARE_PROVIDER_SITE_OTHER): Payer: BC Managed Care – PPO | Admitting: Nurse Practitioner

## 2022-12-10 ENCOUNTER — Encounter: Payer: Self-pay | Admitting: Nurse Practitioner

## 2022-12-10 VITALS — BP 116/76 | HR 98 | Temp 98.8°F | Resp 18 | Ht 66.0 in | Wt 176.9 lb

## 2022-12-10 DIAGNOSIS — R059 Cough, unspecified: Secondary | ICD-10-CM

## 2022-12-10 DIAGNOSIS — R110 Nausea: Secondary | ICD-10-CM

## 2022-12-10 DIAGNOSIS — R52 Pain, unspecified: Secondary | ICD-10-CM

## 2022-12-10 DIAGNOSIS — R509 Fever, unspecified: Secondary | ICD-10-CM | POA: Diagnosis not present

## 2022-12-10 MED ORDER — ONDANSETRON 4 MG PO TBDP: 4.0000 mg | ORAL_TABLET | Freq: Three times a day (TID) | ORAL | 0 refills | Status: DC | PRN

## 2022-12-10 NOTE — Progress Notes (Signed)
BP 116/76 (BP Location: Left Arm, Patient Position: Sitting, Cuff Size: Normal)   Pulse 98   Temp 98.8 F (37.1 C) (Oral)   Resp 18   Ht 5\' 6"  (1.676 m) Comment: per patient  Wt 176 lb 14.4 oz (80.2 kg)   SpO2 99%   BMI 28.55 kg/m    Subjective:    Patient ID: Margaret Gates, female    DOB: 08-14-1980, 42 y.o.   MRN: [id number]  HPI: Margaret Gates is a 42 y.o. female  Chief Complaint  Patient presents with   Fever   Generalized Body Aches   Cough    X 3 days   Discussed the use of AI scribe software for clinical note transcription with the patient, who gave verbal consent to proceed.  History of Present Illness   The patient, who works at a high school, presents with a severe headache, body aches, fever, and nasal congestion that started on Saturday night. She reports feeling 'drained' and has been unable to eat since Saturday due to feeling unwell. She has been experiencing intermittent fever, which she 'sweat out,' and then it returns. She has been using Flonase to manage nasal congestion. She also reports aches all over her body, including her ears. She has been around sick individuals at her workplace. She has been taking ibuprofen or Tylenol for the headache. She also takes hydrocortisone 15mg  daily for adrenal insufficiency, but has been doubling up on her regular meds due to her current illness. She has not been able to sleep well due to night sweats. She also reports a feeling of irritation and pain in her ear.   Relevant past medical, surgical, family and social history reviewed and updated as indicated. Interim medical history since our last visit reviewed. Allergies and medications reviewed and updated.  Review of Systems  Ten systems reviewed and is negative except as mentioned in HPI.      Objective:    BP 116/76 (BP Location: Left Arm, Patient Position: Sitting, Cuff Size: Normal)   Pulse 98   Temp 98.8 F (37.1 C) (Oral)   Resp 18   Ht 5\' 6"   (1.676 m) Comment: per patient  Wt 176 lb 14.4 oz (80.2 kg)   SpO2 99%   BMI 28.55 kg/m   Wt Readings from Last 3 Encounters:  12/10/22 176 lb 14.4 oz (80.2 kg)  09/19/22 180 lb 11.2 oz (82 kg)  02/08/22 187 lb 11.2 oz (85.1 kg)    Physical Exam  Constitutional: Patient appears well-developed and well-nourished. No distress.  HEENT: head atraumatic, normocephalic, pupils equal and reactive to light, ears Tms clear, neck supple, throat within normal limits, facial tenderness Cardiovascular: Normal rate, regular rhythm and normal heart sounds.  No murmur heard. No BLE edema. Pulmonary/Chest: Effort normal and breath sounds normal. No respiratory distress. Abdominal: Soft.  There is no tenderness. Psychiatric: Patient has a normal mood and affect. behavior is normal. Judgment and thought content normal.  Results for orders placed or performed in visit on 09/19/22  CBC with Differential/Platelet  Result Value Ref Range   WBC 7.2 3.8 - 10.8 Thousand/uL   RBC 4.25 3.80 - 5.10 Million/uL   Hemoglobin 13.9 11.7 - 15.5 g/dL   HCT 75.6 43.3 - 29.5 %   MCV 101.4 (H) 80.0 - 100.0 fL   MCH 32.7 27.0 - 33.0 pg   MCHC 32.3 32.0 - 36.0 g/dL   RDW 18.8 41.6 - 60.6 %   Platelets 336 140 -  400 Thousand/uL   MPV 10.2 7.5 - 12.5 fL   Neutro Abs 3,233 1,500 - 7,800 cells/uL   Lymphs Abs 2,578 850 - 3,900 cells/uL   Absolute Monocytes 749 200 - 950 cells/uL   Eosinophils Absolute 583 (H) 15 - 500 cells/uL   Basophils Absolute 58 0 - 200 cells/uL   Neutrophils Relative % 44.9 %   Total Lymphocyte 35.8 %   Monocytes Relative 10.4 %   Eosinophils Relative 8.1 %   Basophils Relative 0.8 %  COMPLETE METABOLIC PANEL WITH GFR  Result Value Ref Range   Glucose, Bld 83 65 - 99 mg/dL   BUN 11 7 - 25 mg/dL   Creat 0.10 2.72 - 5.36 mg/dL   eGFR 644 > OR = 60 IH/KVQ/2.59D6   BUN/Creatinine Ratio SEE NOTE: 6 - 22 (calc)   Sodium 139 135 - 146 mmol/L   Potassium 3.9 3.5 - 5.3 mmol/L   Chloride 105 98 -  110 mmol/L   CO2 25 20 - 32 mmol/L   Calcium 9.8 8.6 - 10.2 mg/dL   Total Protein 6.8 6.1 - 8.1 g/dL   Albumin 4.7 3.6 - 5.1 g/dL   Globulin 2.1 1.9 - 3.7 g/dL (calc)   AG Ratio 2.2 1.0 - 2.5 (calc)   Total Bilirubin 0.5 0.2 - 1.2 mg/dL   Alkaline phosphatase (APISO) 61 31 - 125 U/L   AST 11 10 - 30 U/L   ALT 15 6 - 29 U/L  Lipid panel  Result Value Ref Range   Cholesterol 215 (H) <200 mg/dL   HDL 56 > OR = 50 mg/dL   Triglycerides 387 (H) <150 mg/dL   LDL Cholesterol (Calc) 132 (H) mg/dL (calc)   Total CHOL/HDL Ratio 3.8 <5.0 (calc)   Non-HDL Cholesterol (Calc) 159 (H) <130 mg/dL (calc)  Hemoglobin F6E  Result Value Ref Range   Hgb A1c MFr Bld 5.4 <5.7 % of total Hgb   Mean Plasma Glucose 108 mg/dL   eAG (mmol/L) 6.0 mmol/L      Assessment & Plan:   Problem List Items Addressed This Visit   None Visit Diagnoses     Body aches    -  Primary   Relevant Orders   Novel Coronavirus, NAA ([facility])   Cough, unspecified type       Relevant Orders   Novel Coronavirus, NAA ([facility])   Fever, unspecified fever cause       Relevant Orders   Novel Coronavirus, NAA ([facility])   Nausea       Relevant Medications   ondansetron (ZOFRAN-ODT) 4 MG disintegrating tablet       Assessment and Plan    Influenza-like Illness Presents with headache, body aches, fever, and nasal congestion. No cough. 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 COVID-19 test pending. -Wait for COVID-19 test results. If negative, initiate Tamiflu for suspected influenza. If positive, initiate Paxlovid for COVID-19 treatment. -Prescribe Zofran for nausea. -Advise to continue Flonase, and consider adding Zyrtec or Claritin for sinus congestion. -Provide sick note for the week due to potential infectious disease.  Chronic Steroid Use/ adrenal insufficiency Patient on hydrocortisone 30mg  daily for unspecified chronic condition. -Continue hydrocortisone 30mg  daily. No changes to  steroid regimen at this time.        Follow up plan: Return if symptoms worsen or fail to improve.

## 2022-12-11 ENCOUNTER — Encounter: Payer: Self-pay | Admitting: Nurse Practitioner

## 2022-12-12 ENCOUNTER — Other Ambulatory Visit: Payer: Self-pay | Admitting: Nurse Practitioner

## 2022-12-12 DIAGNOSIS — U071 COVID-19: Secondary | ICD-10-CM

## 2022-12-12 LAB — NOVEL CORONAVIRUS, NAA: SARS-CoV-2, NAA: DETECTED — AB

## 2022-12-12 LAB — SPECIMEN STATUS REPORT

## 2022-12-12 MED ORDER — NIRMATRELVIR/RITONAVIR (PAXLOVID)TABLET: 3.0000 | ORAL_TABLET | Freq: Two times a day (BID) | ORAL | 0 refills | Status: AC

## 2022-12-17 ENCOUNTER — Encounter: Payer: Self-pay | Admitting: Nurse Practitioner

## 2023-01-02 DIAGNOSIS — K828 Other specified diseases of gallbladder: Secondary | ICD-10-CM

## 2023-01-02 HISTORY — DX: Other specified diseases of gallbladder: K82.8

## 2023-02-18 ENCOUNTER — Ambulatory Visit: Payer: 59 | Admitting: Nurse Practitioner

## 2023-02-18 ENCOUNTER — Encounter: Payer: Self-pay | Admitting: Nurse Practitioner

## 2023-02-18 VITALS — BP 128/72 | HR 112 | Temp 98.5°F | Resp 18 | Ht 66.0 in | Wt 179.0 lb

## 2023-02-18 DIAGNOSIS — M25551 Pain in right hip: Secondary | ICD-10-CM

## 2023-02-18 DIAGNOSIS — M25532 Pain in left wrist: Secondary | ICD-10-CM

## 2023-02-18 DIAGNOSIS — W19XXXA Unspecified fall, initial encounter: Secondary | ICD-10-CM | POA: Diagnosis not present

## 2023-02-18 NOTE — Progress Notes (Signed)
 BP 128/72   Pulse (!) 112   Temp 98.5 F (36.9 C)   Resp 18   Ht 5\' 6"  (1.676 m)   Wt 179 lb (81.2 kg)   SpO2 96%   BMI 28.89 kg/m    Subjective:    Patient ID: Margaret Gates, female    DOB: 12/05/1980, 43 y.o.   MRN: [id number]  HPI: Margaret Gates is a 43 y.o. female  Chief Complaint  Patient presents with   Wrist Pain    Larey Seat hurt wrist on 02/17/23   Hip Pain    right    Discussed the use of AI scribe software for clinical note transcription with the patient, who gave verbal consent to proceed.  History of Present Illness   The patient presents with severe pain in the left hand/wrist and right  hip following a fall while roller skating. She reports landing on her left hand and right hip after attempting to avoid a sticky spot on the floor. The pain was severe enough to disrupt her sleep and she describes it as 'killing me.' She attempted to manage the pain with ice, Tylenol (1000mg ), and a few ibuprofen tablets, as well as a muscle relaxer. The patient also has a history of carpal tunnel syndrome, for which she has undergone surgery twice.  The pain in the hand radiates down from the wrist, with the most severe pain localized to the 'snuff box' area. The patient also reports pain radiating to the fingers. The hip pain is localized to the right side, near the tailbone, and radiates downwards.       12/10/2022   11:41 AM 09/19/2022    9:03 AM 02/08/2022   11:41 AM  Depression screen PHQ 2/9  Decreased Interest 0 0 0  Down, Depressed, Hopeless 0 0 0  PHQ - 2 Score 0 0 0  Altered sleeping  0   Tired, decreased energy  0   Change in appetite  0   Feeling bad or failure about yourself   0   Trouble concentrating  0   Moving slowly or fidgety/restless  0   Suicidal thoughts  0   PHQ-9 Score  0   Difficult doing work/chores  Not difficult at all     Relevant past medical, surgical, family and social history reviewed and updated as indicated. Interim medical  history since our last visit reviewed. Allergies and medications reviewed and updated.  Review of Systems  Ten systems reviewed and is negative except as mentioned in HPI      Objective:    BP 128/72   Pulse (!) 112   Temp 98.5 F (36.9 C)   Resp 18   Ht 5\' 6"  (1.676 m)   Wt 179 lb (81.2 kg)   SpO2 96%   BMI 28.89 kg/m    Wt Readings from Last 3 Encounters:  02/18/23 179 lb (81.2 kg)  12/10/22 176 lb 14.4 oz (80.2 kg)  09/19/22 180 lb 11.2 oz (82 kg)    Physical Exam Vitals reviewed.  Constitutional:      Appearance: Normal appearance.  HENT:     Head: Normocephalic.  Cardiovascular:     Rate and Rhythm: Normal rate and regular rhythm.  Pulmonary:     Effort: Pulmonary effort is normal.     Breath sounds: Normal breath sounds.  Musculoskeletal:     Right wrist: Normal.     Left wrist: Swelling, tenderness and snuff box tenderness present. Decreased range of  motion.     Right hip: Tenderness present. No crepitus. Normal range of motion.  Skin:    General: Skin is warm and dry.  Neurological:     General: No focal deficit present.     Mental Status: She is alert and oriented to person, place, and time. Mental status is at baseline.  Psychiatric:        Mood and Affect: Mood normal.        Behavior: Behavior normal.        Thought Content: Thought content normal.        Judgment: Judgment normal.     Results for orders placed or performed in visit on 12/10/22  Novel Coronavirus, NAA ([facility])   Collection Time: 12/10/22 12:00 AM   Specimen: Nasopharyngeal(NP) swabs in vial transport medium   Nasopharynge  Previous  Result Value Ref Range   SARS-CoV-2, NAA Detected (A) Not Detected  Specimen status report   Collection Time: 12/10/22 12:00 AM  Result Value Ref Range   specimen status report Comment        Assessment & Plan:   Problem List Items Addressed This Visit   None Visit Diagnoses       Left wrist pain    -  Primary     Right hip pain          Fall, initial encounter            Assessment and Plan    Wrist Injury Pain and tenderness in the wrist after a fall. Pain radiates to the fingers. History of carpal tunnel surgery.  -Refer to Emergency Ortho for x-ray and possible splinting.  Hip Injury Pain and tenderness in the hip after a fall. No pain on palpation of the tailbone. -Refer to Emergency [facility] for x-ray.  Pain Management Current regimen of Tylenol and ibuprofen not providing sufficient relief. -Continue current regimen until seen by Emergency Ortho.        Follow up plan: Return if symptoms worsen or fail to improve.

## 2023-02-21 ENCOUNTER — Encounter: Payer: Self-pay | Admitting: Nurse Practitioner

## 2023-02-23 IMAGING — CR DG CHEST 2V
2 series · 2 of 2 positions shown · non-contrast
Comparison: PA chest and rib series 01/03/2017

CLINICAL DATA: Fever.  Sepsis workup.

EXAM:
CHEST - 2 VIEW

[chest pa]
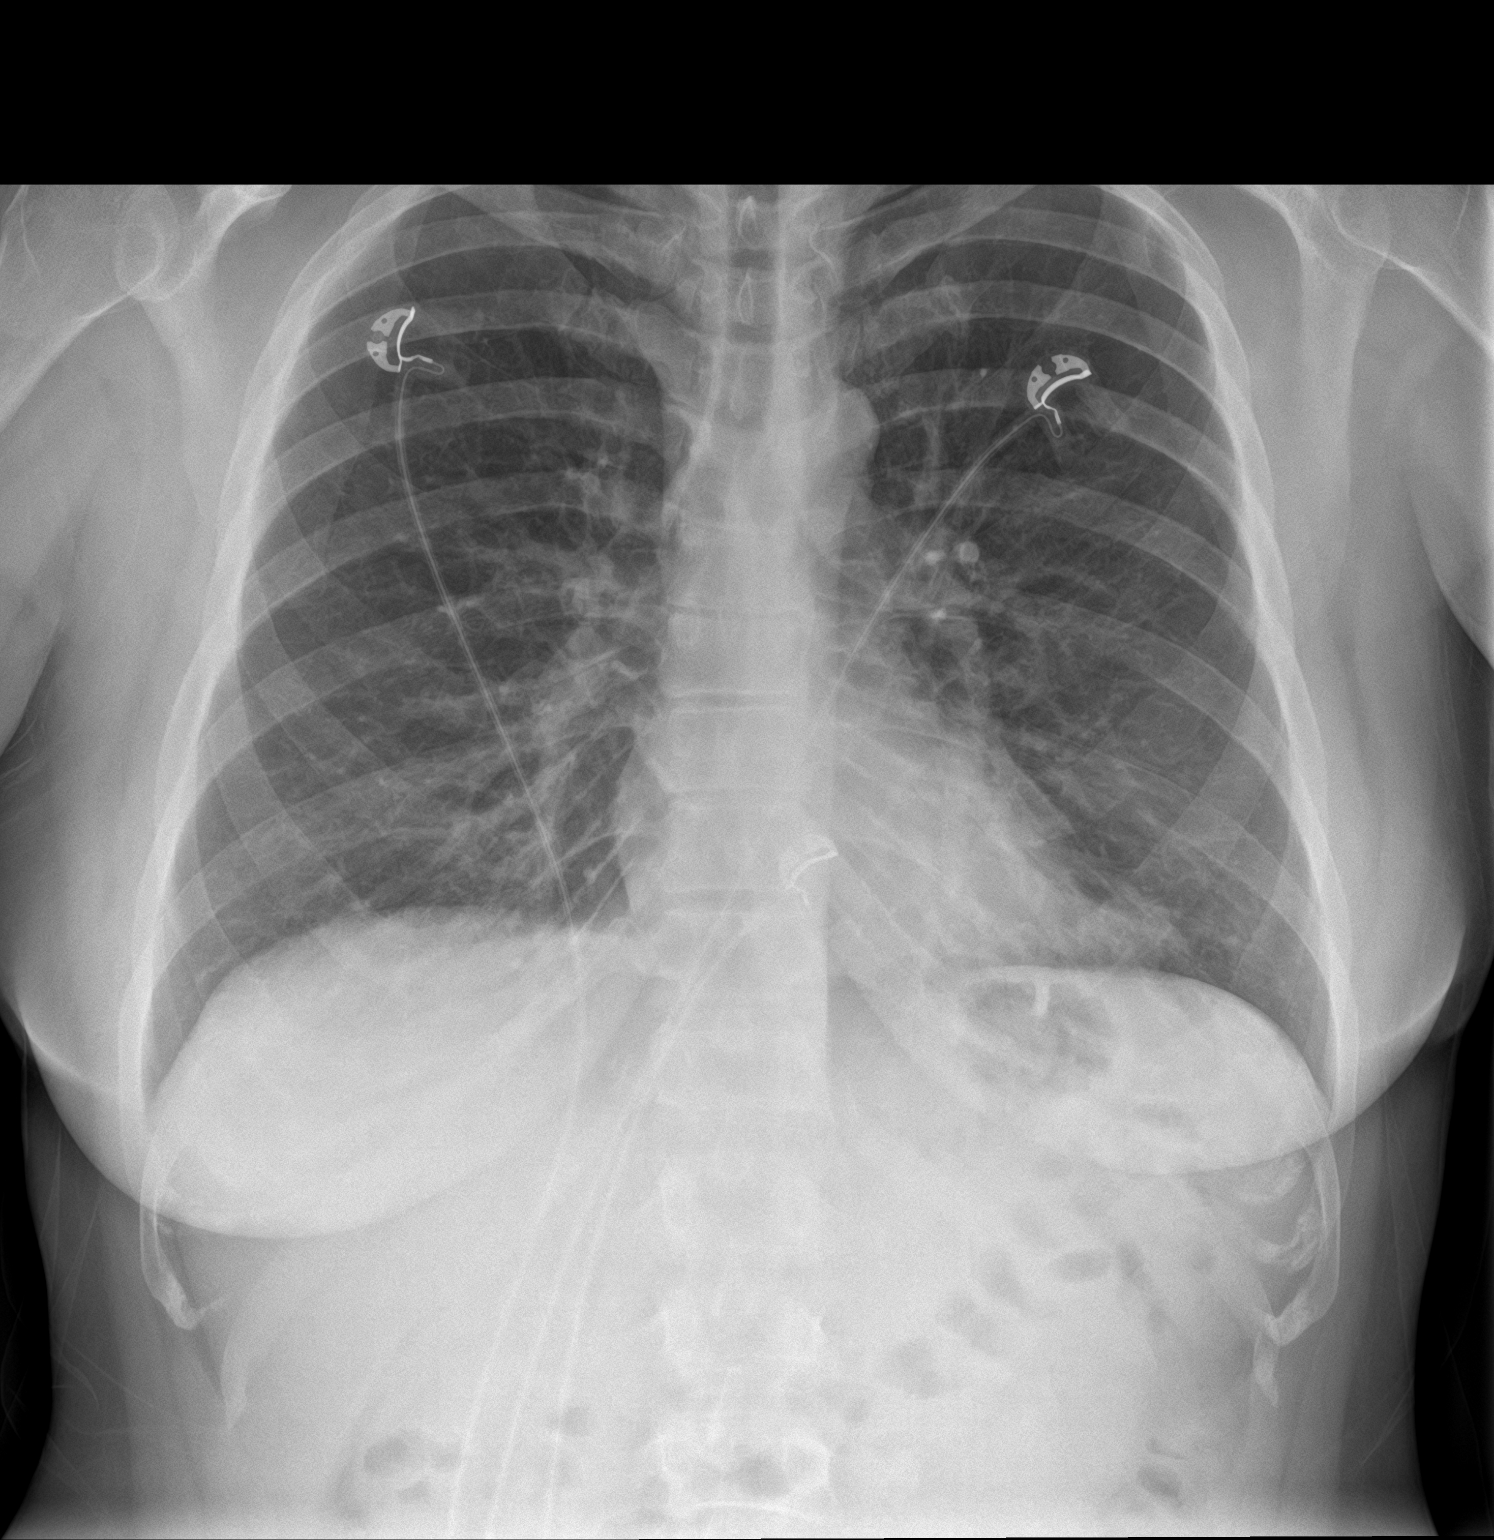

[chest lat]
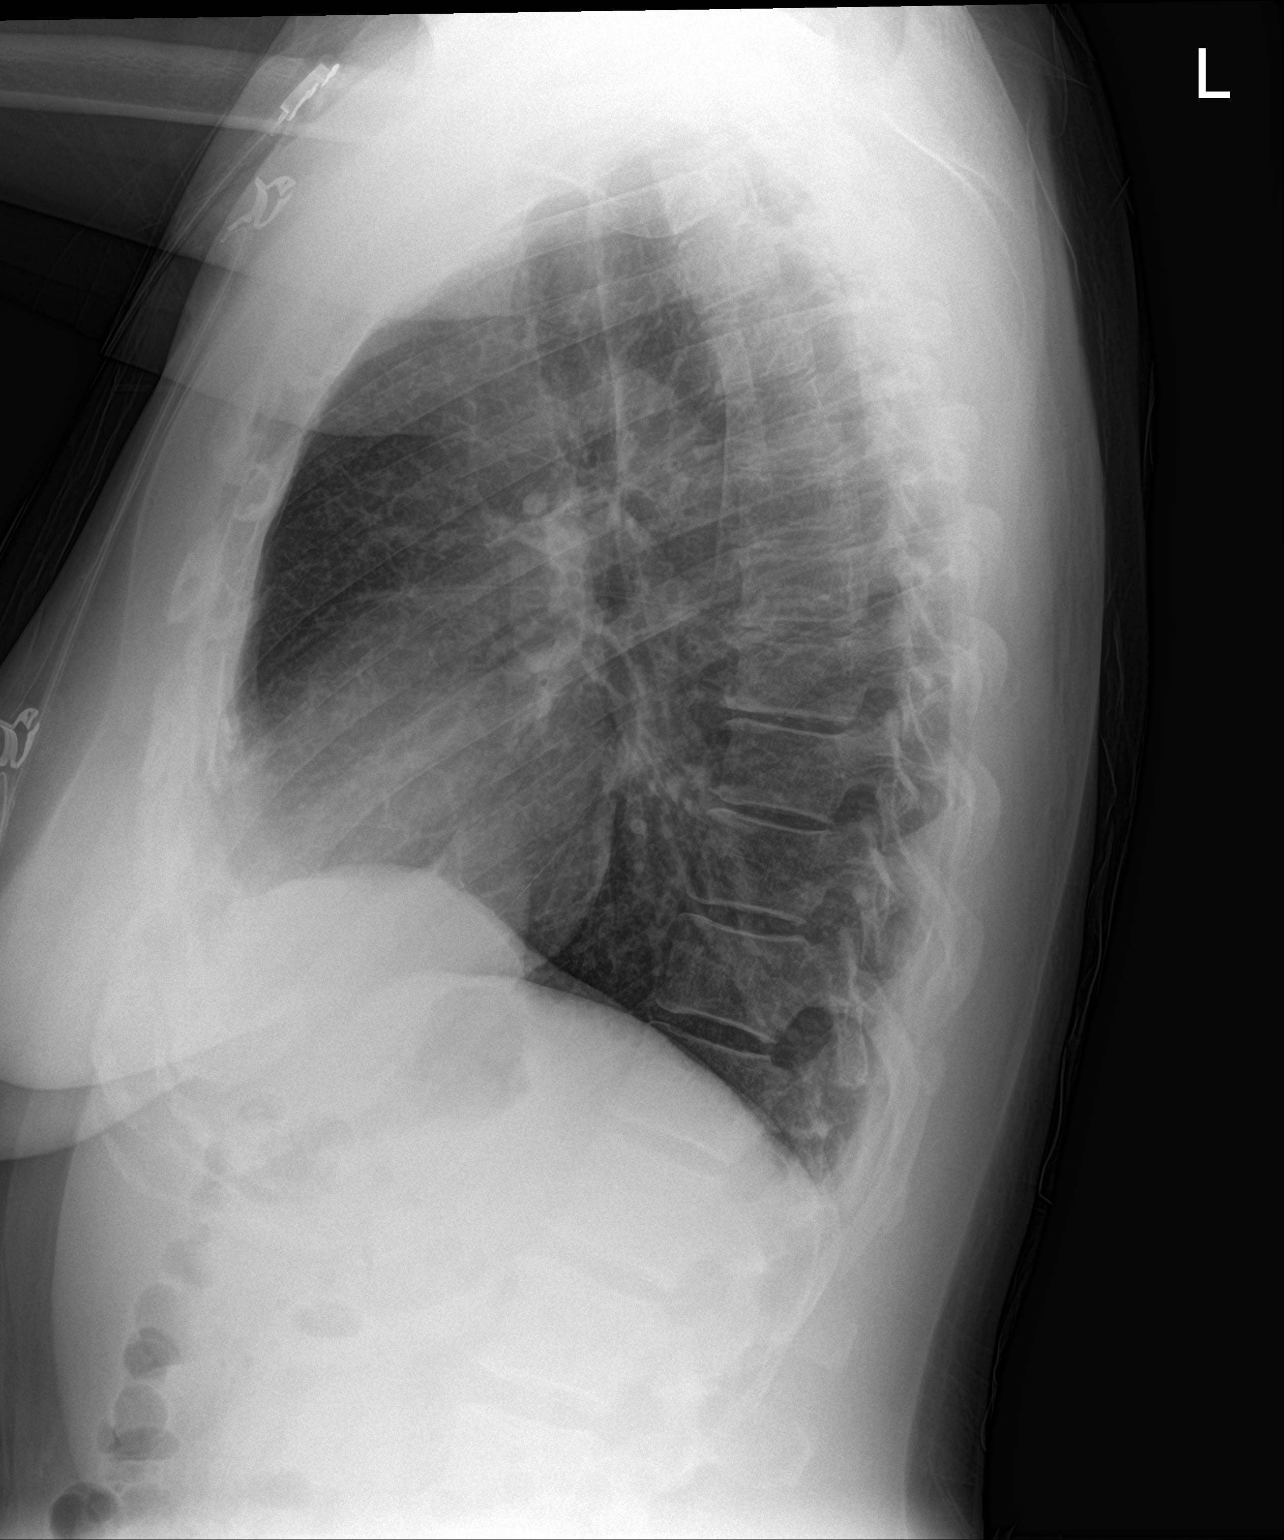

[2 of 2 positions shown; findings below may reference images not displayed]

FINDINGS: The heart size and mediastinal contours are within normal limits. PA
there is increased opacity in the pericardiac left base obscuring
left heart border, concerning for a lingular infiltrate although not
well localized on the lateral view. This was not seen previously. No
pleural effusion is seen. The visualized skeletal structures are
unremarkable.
IMPRESSION: Findings concerning for a lingular infiltrate on the PA but is not
well seen on the lateral view, alternative explanation would be
interval scarring in the area.

## 2023-02-23 IMAGING — US US ABDOMEN LIMITED
1 series · 14 of 25 positions shown · non-contrast
Comparison: None.

CLINICAL DATA: Right upper quadrant abdominal pain.

EXAM:
ULTRASOUND ABDOMEN LIMITED RIGHT UPPER QUADRANT

[Series 1: us abdomen limited ruq (liver/gb) · 14 of 40 slices shown]
[im 1/40]
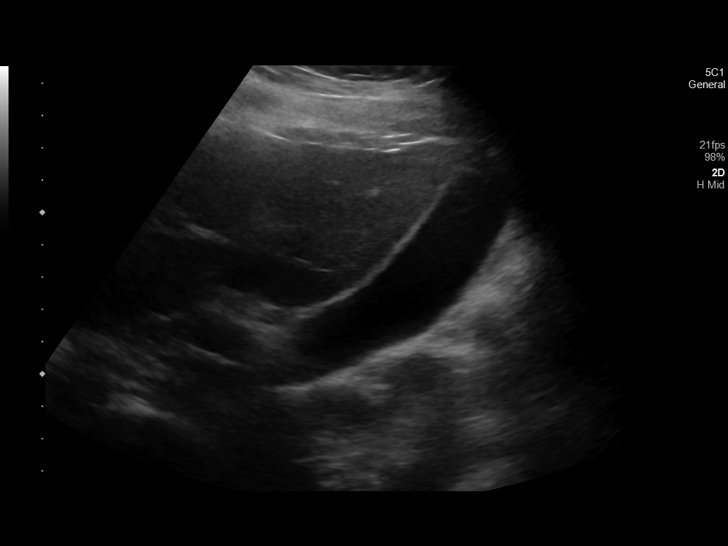
[im 4/40]
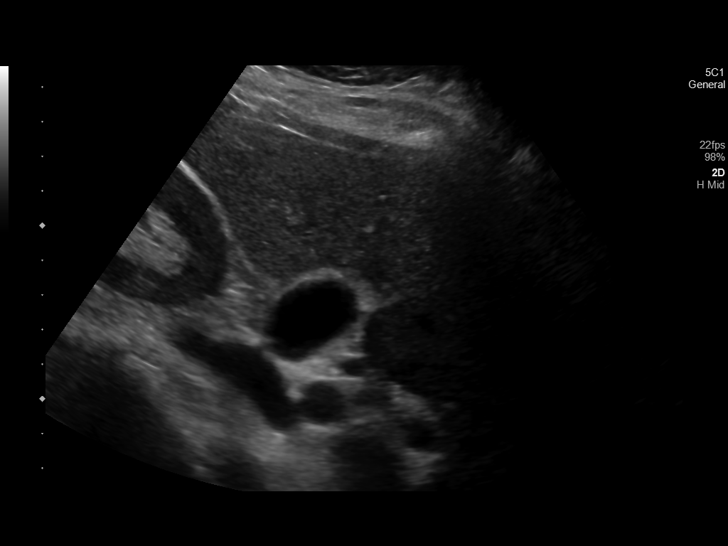
[im 7/40]
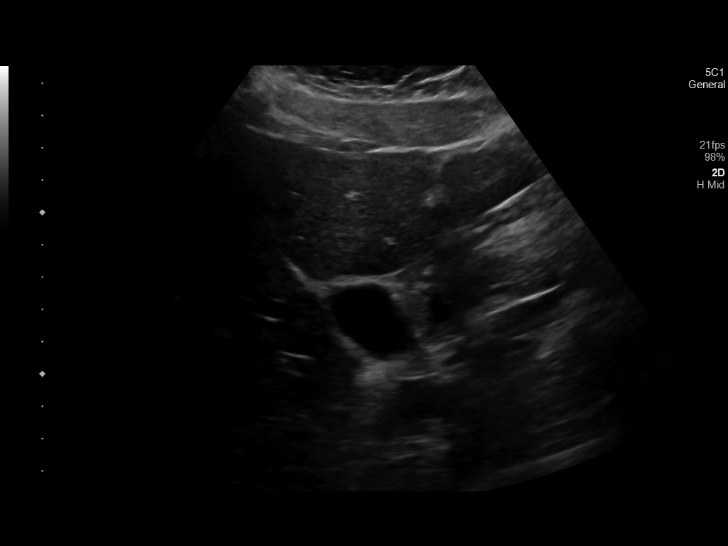
[im 10/40]
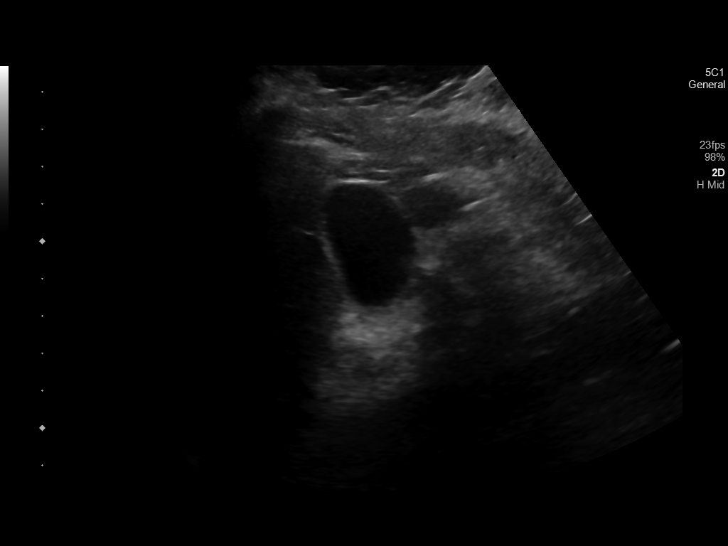
[im 14/40]
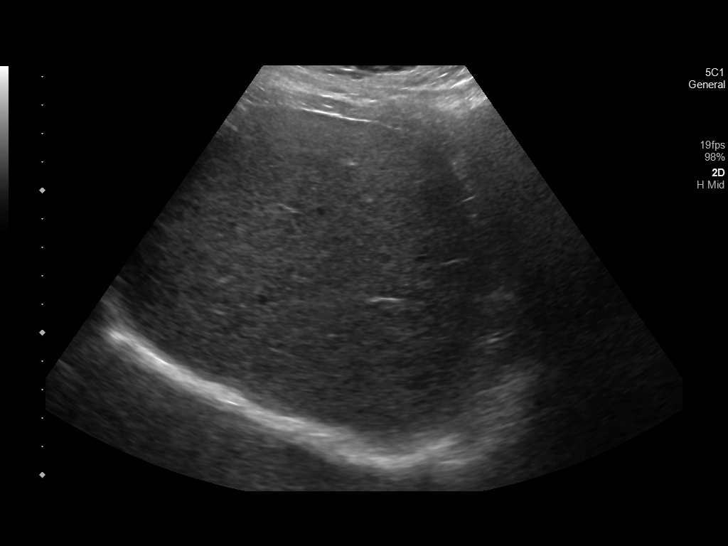
[im 15/40]
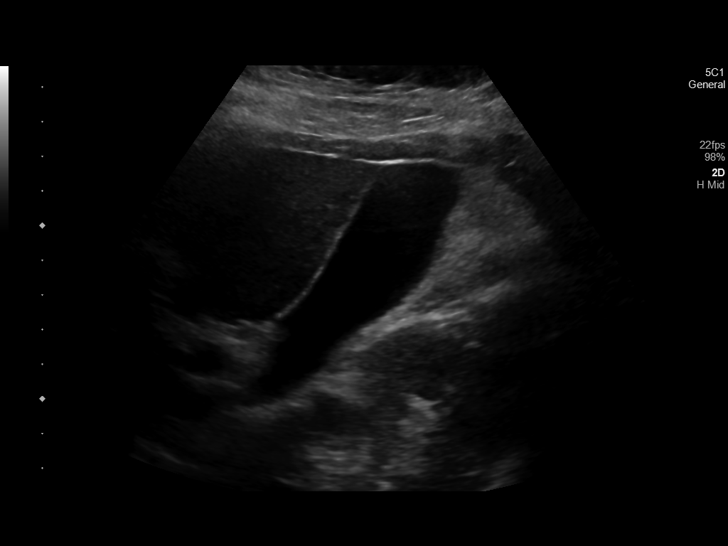
[im 18/40]
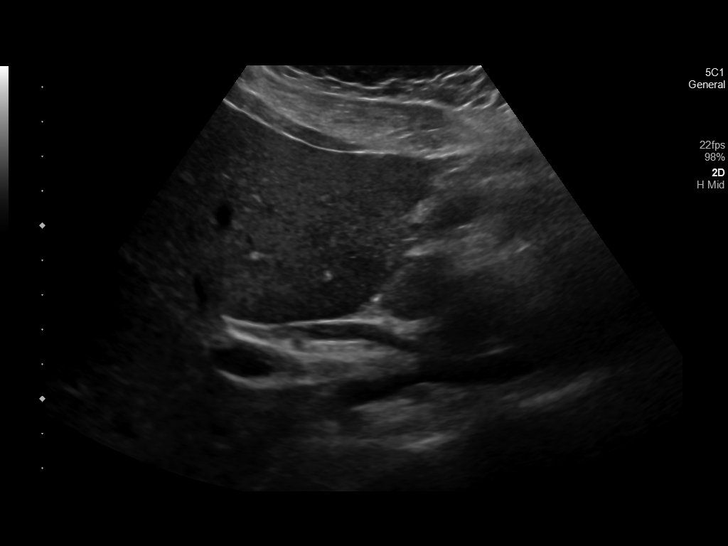
[im 22/40]
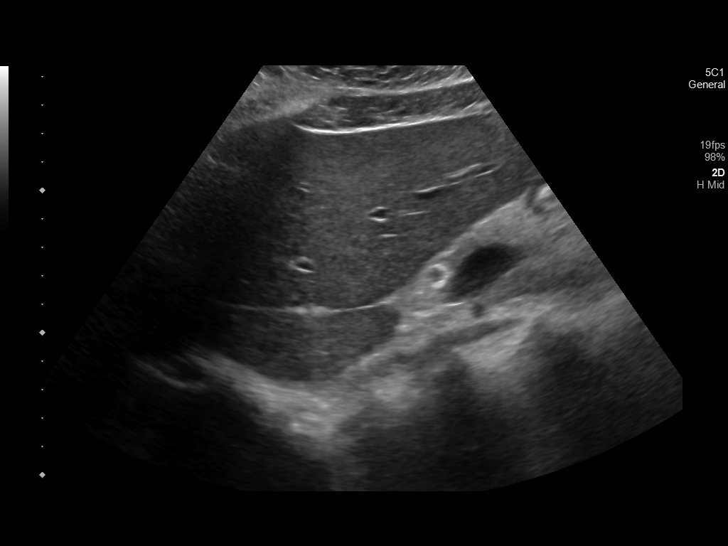
[im 25/40]
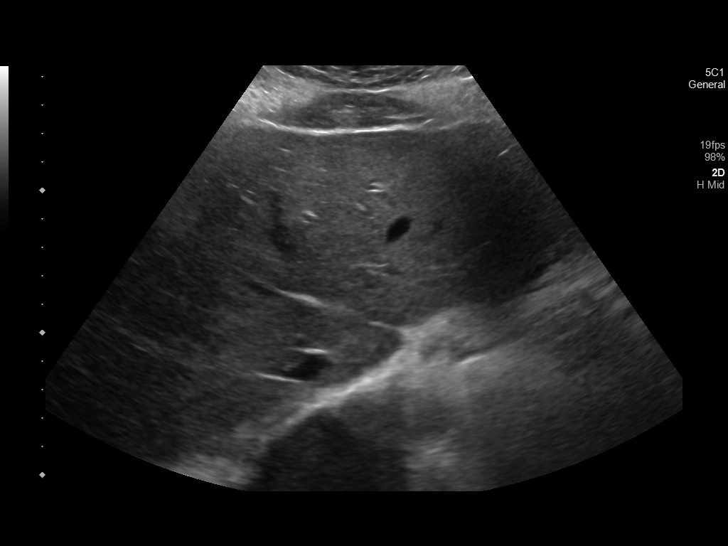
[im 27/40]
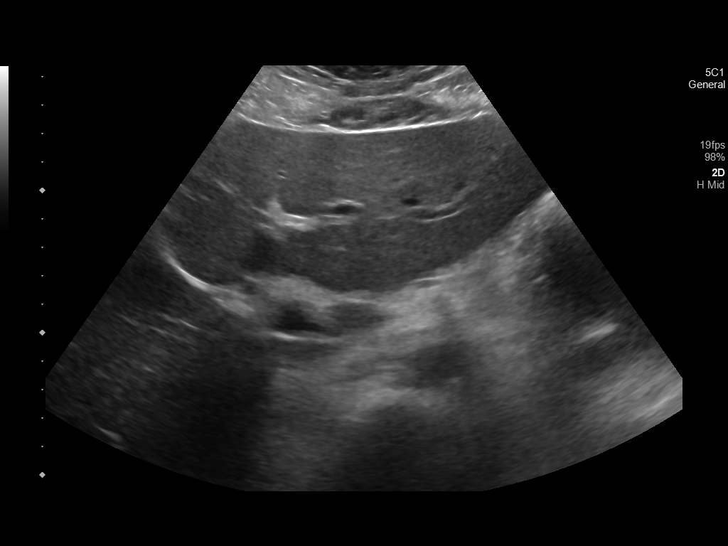
[im 30/40]
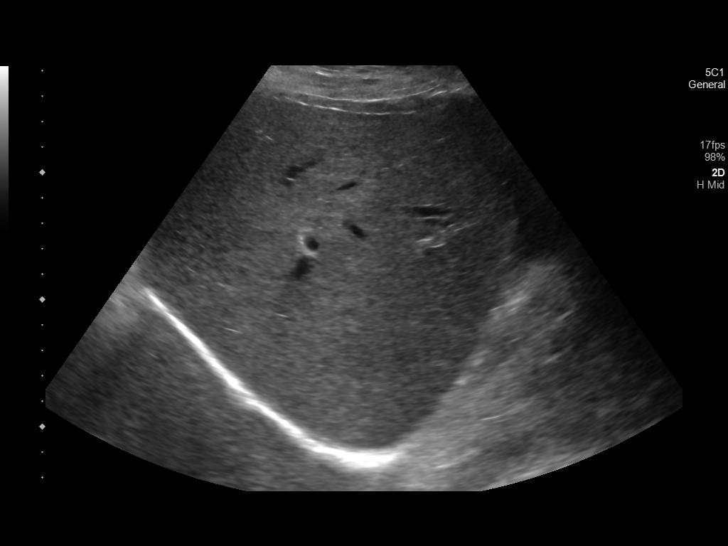
[im 33/40]
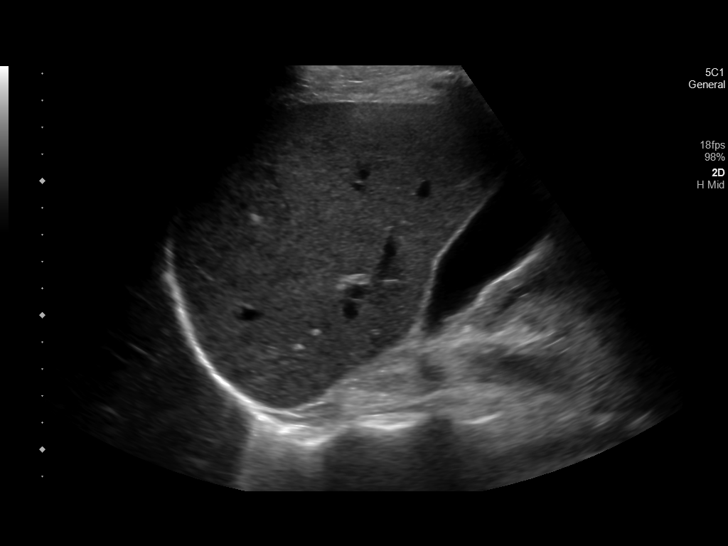
[im 36/40]
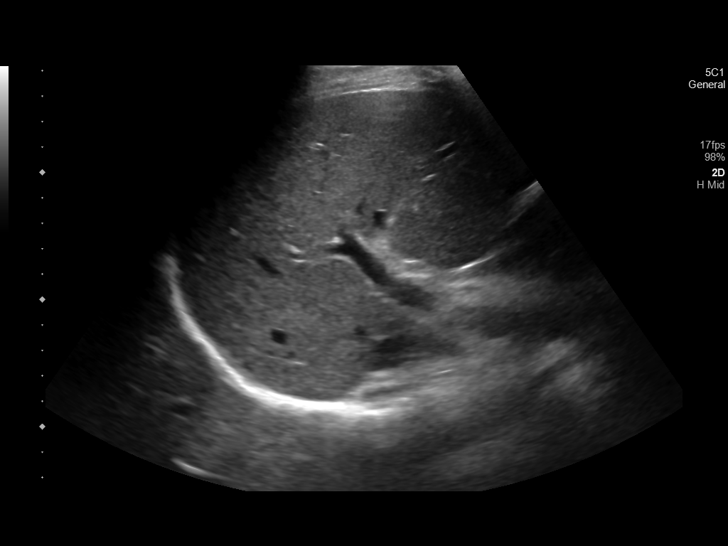
[im 40/40]
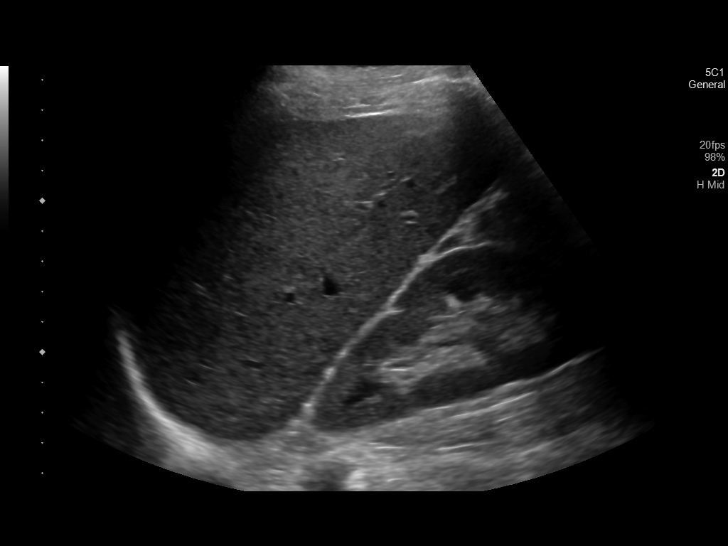

[14 of 25 positions shown; findings below may reference images not displayed]

FINDINGS: Gallbladder:

No gallstones or wall thickening visualized. No sonographic Murphy
sign noted by sonographer.

Common bile duct:

Diameter: 5 mm

Liver:

No focal lesion identified. Within normal limits in parenchymal
echogenicity. Portal vein is patent on color Doppler imaging with
normal direction of blood flow towards the liver.

Other: None.
IMPRESSION: Unremarkable right upper quadrant ultrasound.

## 2023-05-03 ENCOUNTER — Encounter

## 2023-05-16 ENCOUNTER — Ambulatory Visit
Admission: RE | Admit: 2023-05-16 | Discharge: 2023-05-16 | Disposition: A | Discharge status: Home / Self Care | Source: Ambulatory Visit | Attending: Nurse Practitioner | Admitting: Nurse Practitioner

## 2023-05-16 ENCOUNTER — Ambulatory Visit: Admitting: Nurse Practitioner

## 2023-05-16 ENCOUNTER — Encounter: Payer: Self-pay | Admitting: Nurse Practitioner

## 2023-05-16 ENCOUNTER — Other Ambulatory Visit
Admission: RE | Admit: 2023-05-16 | Discharge: 2023-05-16 | Disposition: A | Discharge status: Home / Self Care | Source: Ambulatory Visit | Attending: Nurse Practitioner | Admitting: *Deleted

## 2023-05-16 VITALS — BP 114/74 | HR 101 | Temp 98.0°F | Ht 66.0 in | Wt 179.8 lb

## 2023-05-16 DIAGNOSIS — R1011 Right upper quadrant pain: Secondary | ICD-10-CM | POA: Insufficient documentation

## 2023-05-16 DIAGNOSIS — R109 Unspecified abdominal pain: Secondary | ICD-10-CM | POA: Diagnosis not present

## 2023-05-16 LAB — LIPASE, BLOOD: Lipase: 53 U/L — ABNORMAL HIGH (ref 11–51)

## 2023-05-16 LAB — CBC WITH DIFFERENTIAL/PLATELET
Abs Immature Granulocytes: 0.02 10*3/uL (ref 0.00–0.07)
Basophils Absolute: 0.1 10*3/uL (ref 0.0–0.1)
Basophils Relative: 1 %
Eosinophils Absolute: 0.7 10*3/uL — ABNORMAL HIGH (ref 0.0–0.5)
Eosinophils Relative: 8 %
HCT: 41.5 % (ref 36.0–46.0)
Hemoglobin: 14.1 g/dL (ref 12.0–15.0)
Immature Granulocytes: 0 %
Lymphocytes Relative: 25 %
Lymphs Abs: 2 10*3/uL (ref 0.7–4.0)
MCH: 32.5 pg (ref 26.0–34.0)
MCHC: 34 g/dL (ref 30.0–36.0)
MCV: 95.6 fL (ref 80.0–100.0)
Monocytes Absolute: 0.7 10*3/uL (ref 0.1–1.0)
Monocytes Relative: 9 %
Neutro Abs: 4.6 10*3/uL (ref 1.7–7.7)
Neutrophils Relative %: 57 %
Platelets: 315 10*3/uL (ref 150–400)
RBC: 4.34 MIL/uL (ref 3.87–5.11)
RDW: 11.6 % (ref 11.5–15.5)
WBC: 8.1 10*3/uL (ref 4.0–10.5)
nRBC: 0 % (ref 0.0–0.2)

## 2023-05-16 LAB — COMPREHENSIVE METABOLIC PANEL WITH GFR
ALT: 16 U/L (ref 0–44)
AST: 14 U/L — ABNORMAL LOW (ref 15–41)
Albumin: 4.5 g/dL (ref 3.5–5.0)
Alkaline Phosphatase: 53 U/L (ref 38–126)
Anion gap: 8 (ref 5–15)
BUN: 12 mg/dL (ref 6–20)
CO2: 27 mmol/L (ref 22–32)
Calcium: 9.4 mg/dL (ref 8.9–10.3)
Chloride: 104 mmol/L (ref 98–111)
Creatinine, Ser: 0.67 mg/dL (ref 0.44–1.00)
GFR, Estimated: >60 mL/min (ref >60)
Glucose, Bld: 94 mg/dL (ref 70–99)
Potassium: 3.6 mmol/L (ref 3.5–5.1)
Sodium: 139 mmol/L (ref 135–145)
Total Bilirubin: 1 mg/dL (ref 0.0–1.2)
Total Protein: 7.2 g/dL (ref 6.5–8.1)

## 2023-05-16 LAB — POCT URINALYSIS DIPSTICK
Bilirubin, UA: NEGATIVE
Blood, UA: NEGATIVE
Glucose, UA: NEGATIVE
Ketones, UA: POSITIVE
Leukocytes, UA: NEGATIVE
Nitrite, UA: NEGATIVE
Protein, UA: NEGATIVE
Spec Grav, UA: 1.01 (ref 1.010–1.025)
Urobilinogen, UA: 0.2 U/dL
pH, UA: 6 (ref 5.0–8.0)

## 2023-05-16 LAB — POCT URINE PREGNANCY: Preg Test, Ur: NEGATIVE

## 2023-05-16 NOTE — Progress Notes (Signed)
 BP 114/74   Pulse (!) 101   Temp 98 F (36.7 C) (Oral)   Ht 5\' 6"  (1.676 m)   Wt 179 lb 12.8 oz (81.6 kg)   SpO2 100%   BMI 29.02 kg/m    Subjective:    Patient ID: Margaret Gates, female    DOB: 16-Dec-1980, 43 y.o.   MRN: [id number]  HPI: Margaret Gates is a 43 y.o. female  Chief Complaint  Patient presents with   Flank Pain    Right side, radiates to back, onset 4-5 days     Discussed the use of AI scribe software for clinical note transcription with the patient, who gave verbal consent to proceed.  History of Present Illness Margaret Gates is a 43 year old female who presents with right-sided abdominal pain.  She has been experiencing right-sided abdominal pain since Sunday night, described as a stabbing sensation, particularly when bending over, with radiation to the back. Eating exacerbates the pain, and bending over induces nausea, though she denies vomiting.  She has a history of being evaluated for gallbladder issues approximately five years ago, during which she was told her gallbladder was fine and was diagnosed with irritable bowel syndrome (IBS). She was prescribed Linzess at that time.  No fever, urinary symptoms, diarrhea, or constipation.   Hcg negative, ua negative      05/16/2023   10:05 AM 12/10/2022   11:41 AM 09/19/2022    9:03 AM  Depression screen PHQ 2/9  Decreased Interest 0 0 0  Down, Depressed, Hopeless 0 0 0  PHQ - 2 Score 0 0 0  Altered sleeping 0  0  Tired, decreased energy 0  0  Change in appetite 0  0  Feeling bad or failure about yourself  0  0  Trouble concentrating 0  0  Moving slowly or fidgety/restless 0  0  Suicidal thoughts 0  0  PHQ-9 Score 0  0  Difficult doing work/chores Not difficult at all  Not difficult at all    Relevant past medical, surgical, family and social history reviewed and updated as indicated. Interim medical history since our last visit reviewed. Allergies and medications reviewed and  updated.  Review of Systems  Ten systems reviewed and is negative except as mentioned in HPI      Objective:      BP 114/74   Pulse (!) 101   Temp 98 F (36.7 C) (Oral)   Ht 5\' 6"  (1.676 m)   Wt 179 lb 12.8 oz (81.6 kg)   SpO2 100%   BMI 29.02 kg/m    Wt Readings from Last 3 Encounters:  05/16/23 179 lb 12.8 oz (81.6 kg)  02/18/23 179 lb (81.2 kg)  12/10/22 176 lb 14.4 oz (80.2 kg)    Physical Exam Vitals reviewed.  Constitutional:      Appearance: Normal appearance.  HENT:     Head: Normocephalic.  Cardiovascular:     Rate and Rhythm: Normal rate.  Pulmonary:     Effort: Pulmonary effort is normal.  Abdominal:     Palpations: Abdomen is soft.     Tenderness: There is abdominal tenderness.     Comments: RUQ tenderness  Neurological:     General: No focal deficit present.     Mental Status: She is alert and oriented to person, place, and time. Mental status is at baseline.  Psychiatric:        Mood and Affect: Mood normal.  Behavior: Behavior normal.        Thought Content: Thought content normal.        Judgment: Judgment normal.               Assessment & Plan:   Problem List Items Addressed This Visit   None Visit Diagnoses       Flank pain    -  Primary   Relevant Orders   POCT Urinalysis Dipstick (Completed)   POCT urine pregnancy     Right upper quadrant abdominal pain       Relevant Orders   US  ABDOMEN LIMITED RUQ (LIVER/GB)   CBC with Differential/Platelet   Comprehensive metabolic panel with GFR   Lipase, blood   POCT urine pregnancy        Assessment and Plan Assessment & Plan Abdominal pain Acute right-sided abdominal pain since Sunday night, exacerbated by eating and bending over, with stabbing quality when bending. Differential diagnosis includes gallbladder pathology, considering the location and nature of the pain. Current symptoms suggest possible gallbladder involvement, despite a history of IBS. - Order  laboratory tests at the hospital - Order abdominal ultrasound to evaluate gallbladder -us  negative, hcg negative  Nausea Nausea associated with abdominal pain, worsens with bending over.        Follow up plan: Return if symptoms worsen or fail to improve.

## 2023-05-16 NOTE — Therapy (Unsigned)
 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)    Asthma    Bronchitis    GERD (gastroesophageal reflux disease)    Headache    Severe sepsis (HCC) 01/26/2021   Tobacco use    Past Surgical History:  Procedure Laterality Date   CARPAL TUNNEL RELEASE Right 08/31/2020   Procedure: CARPAL TUNNEL RELEASE ENDOSCOPIC;  Surgeon: Elner Hahn, MD;  Location: [facility];  Service: Orthopedics;  Laterality: Right;   CARPAL TUNNEL RELEASE Left 10/26/2020   Procedure: CARPAL TUNNEL RELEASE ENDOSCOPIC;  Surgeon: Elner Hahn, MD;  Location: [facility];  Service: Orthopedics;  Laterality: Left;   CARPAL TUNNEL RELEASE Right 08/22/2021   Procedure: OPEN REVISION RIGHT CARPAL TUNNEL RELEASE;  Surgeon: Elner Hahn, MD;  Location: [facility];  Service: Orthopedics;  Laterality: Right;   CARPAL TUNNEL RELEASE Left 11/15/2021   Procedure: Revision open left carpal tunnel release with subcutaneous transposition of ulnar nerve at left elbow;  Surgeon: Elner Hahn, MD;  Location: [facility];  Service: Orthopedics;  Laterality: Left;   CESAREAN SECTION     HIP SURGERY Bilateral    LAPROSCOPIC BONE SPURS   HIP SURGERY Bilateral    ULNAR NERVE TRANSPOSITION Right 08/22/2021   Procedure: SUBCUTANEOUS TRANSPOSITION OF THE ULNAR NERVE AT RIGHT ELBOW.;  Surgeon: Elner Hahn, MD;  Location: [facility];  Service: Orthopedics;  Laterality: Right;   ULNAR NERVE TRANSPOSITION Left 11/15/2021   Procedure: ULNAR NERVE DECOMPRESSION/TRANSPOSITION;  Surgeon: Elner Hahn, MD;  Location: [facility];  Service: Orthopedics;  Laterality: Left;   UPPER GI ENDOSCOPY  2020   Patient Active Problem List   Diagnosis Date Noted   Cubital tunnel syndrome, right  08/18/2021   Bilateral thumb pain 04/03/2021   Adrenal insufficiency (HCC) 01/26/2021   Asthma 01/26/2021   GERD (gastroesophageal reflux disease) 01/26/2021   Tobacco use 01/26/2021   CAP (community acquired pneumonia) 01/26/2021   Carpal tunnel syndrome, right 08/19/2020   Tear of left acetabular labrum 05/06/2015   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  Rationale for Evaluation and Treatment: Rehabilitation  SUBJECTIVE:   SUBJECTIVE STATEMENT: *** Pt accompanied by: self  PERTINENT HISTORY: Assessment Ortho 04/22/23 Encounter Diagnoses  Name Primary?  Left wrist pain  Closed nondisplaced fracture of styloid process of left radius, initial encounter  Sprain of left scapholunate ligament Yes   Plan: The treatment options were discussed with the patient. In addition, patient educational materials were provided regarding the diagnosis and treatment options. The patient is frustrated by her continued symptoms and functional limitations. However, she is reassured that, based on the MRI results, that there is nothing that require surgical intervention at this time. Therefore, I have recommended that she begin formal occupational therapy to work on progressive range of motion and strength exercises to the wrist and hand, as well as to work on modalities to help reduce inflammation. She may continue to use her wrist splint as needed for comfort but is encouraged to gradually wean out of it over the next several weeks. She may then progress in her  activities as symptoms permit, but is to avoid offending activities. She may take over-the-counter medications as needed for discomfort. All of the patient's questions and concerns were answered. She can call any time with further concerns. She will follow up in  5 to 6 weeks for re-evaluation. She may return to light duties at work so long as she does not lift, push, pull, or carry more than 10 pounds with the left hand.   PRECAUTIONS: {Therapy precautions:24002}    WEIGHT BEARING RESTRICTIONS: {Yes ***/No:24003}  PAIN:  Are you having pain?   FALLS: Has patient fallen in last 6 months? Yes. Number of falls 1  LIVING ENVIRONMENT: Lives with: lives with their family   PLOF: {PLOF:24004}  PATIENT GOALS: ***  NEXT MD VISIT: ***  OBJECTIVE:  Note: Objective measures were completed at Evaluation unless otherwise noted.  HAND DOMINANCE: Right  ADLs:   FUNCTIONAL OUTCOME MEASURES: {OTFUNCTIONALMEASURES:27238}  UPPER EXTREMITY ROM:     Active ROM Right eval Left eval  Shoulder flexion    Shoulder abduction    Shoulder adduction    Shoulder extension    Shoulder internal rotation    Shoulder external rotation    Elbow flexion    Elbow extension    Wrist flexion    Wrist extension    Wrist ulnar deviation    Wrist radial deviation    Wrist pronation    Wrist supination    (Blank rows = not tested)  Active ROM Right eval Left eval  Thumb MCP (0-60)    Thumb IP (0-80)    Thumb Radial abd/add (0-55)     Thumb Palmar abd/add (0-45)     Thumb Opposition to Small Finger     Index MCP (0-90)     Index PIP (0-100)     Index DIP (0-70)      Long MCP (0-90)      Long PIP (0-100)      Long DIP (0-70)      Ring MCP (0-90)      Ring PIP (0-100)      Ring DIP (0-70)      Little MCP (0-90)      Little PIP (0-100)      Little DIP (0-70)      (Blank rows = not tested)   UPPER EXTREMITY MMT:     MMT Right eval Left eval  Shoulder flexion    Shoulder abduction    Shoulder adduction    Shoulder extension    Shoulder internal rotation    Shoulder external rotation    Middle trapezius    Lower trapezius    Elbow flexion    Elbow extension    Wrist flexion    Wrist extension    Wrist ulnar deviation    Wrist  radial deviation    Wrist pronation    Wrist supination    (Blank rows = not tested)  HAND FUNCTION: Grip strength: Right: *** lbs; Left: *** lbs, Lateral pinch: Right: *** lbs, Left: *** lbs, and 3 point pinch: Right: *** lbs, Left: *** lbs  COORDINATION: {otcoordination:27237}  SENSATION: {sensation:27233}  EDEMA: ***  COGNITION: Overall cognitive status: Within functional limits for tasks assessed   OBSERVATIONS: ***   TREATMENT DATE: 05/16/25  Modalities: {OPRCMODALITIES:31717}     PATIENT EDUCATION: Education details: findings of eval and HEP  Person educated: Patient Education method: Explanation, Demonstration, Tactile cues, Verbal cues, and Handouts Education comprehension: verbalized understanding, returned demonstration, verbal cues required, and needs further education    GOALS: Goals reviewed with patient? Yes      LONG TERM GOALS: Target date: ***  *** Baseline:  Goal status: INITIAL  2.  *** Baseline:  Goal status: INITIAL  3.  *** Baseline:  Goal status: INITIAL  4.  *** Baseline:  Goal status: INITIAL  5.  *** Baseline:  Goal status: INITIAL  6.  *** Baseline:  Goal status: INITIAL  ASSESSMENT:  CLINICAL IMPRESSION: Patient seen today for occupational therapy evaluation for ***.   PERFORMANCE DEFICITS: in functional skills including ADLs, IADLs, ROM, strength, pain, flexibility, decreased knowledge of use of DME, and UE functional use,   and psychosocial skills including environmental adaptation and routines and behaviors.   IMPAIRMENTS: are limiting patient from ADLs, IADLs, rest and sleep, play, leisure, and social participation.   COMORBIDITIES: has no other co-morbidities that affects occupational performance. Patient will benefit from skilled OT to address above impairments and improve overall  function.  MODIFICATION OR ASSISTANCE TO COMPLETE EVALUATION: No modification of tasks or assist necessary to complete an evaluation.  OT OCCUPATIONAL PROFILE AND HISTORY: Problem focused assessment: Including review of records relating to presenting problem.  CLINICAL DECISION MAKING: LOW - limited treatment options, no task modification necessary  REHAB POTENTIAL: Good for goals  EVALUATION COMPLEXITY: Low    PLAN:  OT FREQUENCY: {rehab frequency:25116}  OT DURATION: {rehab duration:25117}  PLANNED INTERVENTIONS: 97168 OT Re-evaluation, 97535 self care/ADL training, 62130 therapeutic exercise, 97530 therapeutic activity, 97140 manual therapy, 97018 paraffin, 86578 fluidotherapy, passive range of motion, patient/family education, and DME and/or AE instructions    CONSULTED AND AGREED WITH PLAN OF CARE: Patient     Heloise Lobo, OTR/L,CLT 05/16/2023, 5:27 PM

## 2023-05-17 ENCOUNTER — Ambulatory Visit
Admission: RE | Admit: 2023-05-17 | Discharge: 2023-05-17 | Disposition: A | Discharge status: Home / Self Care | Source: Ambulatory Visit | Attending: Nurse Practitioner | Admitting: Nurse Practitioner

## 2023-05-17 ENCOUNTER — Ambulatory Visit: Admitting: Occupational Therapy

## 2023-05-17 ENCOUNTER — Ambulatory Visit: Payer: Self-pay | Admitting: Nurse Practitioner

## 2023-05-17 DIAGNOSIS — R1011 Right upper quadrant pain: Secondary | ICD-10-CM | POA: Insufficient documentation

## 2023-05-17 DIAGNOSIS — R109 Unspecified abdominal pain: Secondary | ICD-10-CM

## 2023-05-17 MED ORDER — FAMOTIDINE 20 MG PO TABS: 20.0000 mg | ORAL_TABLET | Freq: Two times a day (BID) | ORAL | Status: DC

## 2023-05-17 MED ORDER — IOHEXOL 300 MG/ML  SOLN
100.0000 mL | Freq: Once | INTRAMUSCULAR | Status: AC | PRN
  Administered 2023-05-17: 100 mL via INTRAVENOUS

## 2023-05-21 NOTE — Telephone Encounter (Signed)
 Letter sent through [employer]

## 2023-05-22 ENCOUNTER — Ambulatory Visit: Attending: Surgery | Admitting: Occupational Therapy

## 2023-05-22 DIAGNOSIS — M25532 Pain in left wrist: Secondary | ICD-10-CM

## 2023-05-22 DIAGNOSIS — M79642 Pain in left hand: Secondary | ICD-10-CM

## 2023-05-22 DIAGNOSIS — M25632 Stiffness of left wrist, not elsewhere classified: Secondary | ICD-10-CM

## 2023-05-22 DIAGNOSIS — M6281 Muscle weakness (generalized): Secondary | ICD-10-CM | POA: Diagnosis present

## 2023-06-01 ENCOUNTER — Encounter: Payer: Self-pay | Admitting: Occupational Therapy

## 2023-06-01 NOTE — Therapy (Signed)
 OUTPATIENT OCCUPATIONAL THERAPY ORTHO EVALUATION  Patient Name: Margaret Gates MRN: [id number] DOB:11-Jul-1980, 43 y.o., female Today's Date: 06/01/2023  PCP: Donny Gall, FNP REFERRING PROVIDER: Delayne Feather, MD  END OF SESSION:  OT End of Session - 06/01/23 2009     Visit Number 1    Number of Visits 13    Date for OT Re-Evaluation 07/05/23    OT Start Time 1625    OT Stop Time 1715    OT Time Calculation (min) 50 min    Activity Tolerance Patient tolerated treatment well    Behavior During Therapy [facility] for tasks assessed/performed             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 (HCC)    Asthma    Bronchitis    GERD (gastroesophageal reflux disease)    Headache    Severe sepsis (HCC) 01/26/2021   Tobacco use    Past Surgical History:  Procedure Laterality Date   CARPAL TUNNEL RELEASE Right 08/31/2020   Procedure: CARPAL TUNNEL RELEASE ENDOSCOPIC;  Surgeon: Elner Hahn, MD;  Location: [facility] OR[facility];  Service: Orthopedics;  Laterality: Right;   CARPAL TUNNEL RELEASE Left 10/26/2020   Procedure: CARPAL TUNNEL RELEASE ENDOSCOPIC;  Surgeon: Elner Hahn, MD;  Location: [facility];  Service: Orthopedics;  Laterality: Left;   CARPAL TUNNEL RELEASE Right 08/22/2021   Procedure: OPEN REVISION RIGHT CARPAL TUNNEL RELEASE;  Surgeon: Elner Hahn, MD;  Location: [facility];  Service: Orthopedics;  Laterality: Right;   CARPAL TUNNEL RELEASE Left 11/15/2021   Procedure: Revision open left carpal tunnel release with subcutaneous transposition of ulnar nerve at left elbow;  Surgeon: Elner Hahn, MD;  Location: [facility];  Service: Orthopedics;  Laterality: Left;   CESAREAN SECTION     HIP SURGERY Bilateral    LAPROSCOPIC BONE SPURS   HIP SURGERY Bilateral    ULNAR NERVE TRANSPOSITION Right 08/22/2021   Procedure: SUBCUTANEOUS TRANSPOSITION OF THE ULNAR NERVE AT RIGHT ELBOW.;  Surgeon: Elner Hahn, MD;  Location: [facility];   Service: Orthopedics;  Laterality: Right;   ULNAR NERVE TRANSPOSITION Left 11/15/2021   Procedure: ULNAR NERVE DECOMPRESSION/TRANSPOSITION;  Surgeon: Elner Hahn, MD;  Location: [facility];  Service: Orthopedics;  Laterality: Left;   UPPER GI ENDOSCOPY  2020   Patient Active Problem List   Diagnosis Date Noted   Cubital tunnel syndrome, right 08/18/2021   Bilateral thumb pain 04/03/2021   Adrenal insufficiency (HCC) 01/26/2021   Asthma 01/26/2021   GERD (gastroesophageal reflux disease) 01/26/2021   Tobacco use 01/26/2021   CAP (community acquired pneumonia) 01/26/2021   Carpal tunnel syndrome, right 08/19/2020   Tear of left acetabular labrum 05/06/2015   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   Nicotine  dependence, uncomplicated 01/22/2013    ONSET DATE: 02/17/2023  REFERRING DIAG: Closed nondisplaced fracture of styloid process of left radius, Sprain of left scapholunate ligament  , left wrist pain  THERAPY DIAG:  Muscle weakness (generalized)  Pain in left wrist  Stiffness of left wrist, not elsewhere classified  Pain in left hand  Rationale for Evaluation and Treatment: Rehabilitation  SUBJECTIVE:   SUBJECTIVE STATEMENT: Pt reports she was roller skating and fell and had wrist pain, she reports she was initially diagnosed with wrist sprain but was placed in a cast from hand to above the elbow for one week and then a splint.  She did  not work for 2 months.  She was able to return to work the first week of May with restrictions of no lifting more than 10# and wearing her brace at all times. Her goal is to be able to use the hand and wrist again normally. Pt accompanied by: self  PERTINENT HISTORY: Per chart from MD visit with Dr. Daun Epstein: Margaret Gates is a 43 y.o. female who presents for evaluation and treatment of her left wrist injury. Apparently, the patient fell onto her outstretched left hand while  rollerskating 2 months ago. Initially, she presented to the [facility] urgent care clinic where she was diagnosed with a possible scaphoid fracture and placed into a long-arm cast. She followed up with emergency providers on several occasions and was told by varying providers that she had sprained her wrist or that she had fractured her wrist. She has been in a Velcro wrist immobilizer since the cast was removed a week to 10 days after it was first applied. After asking several occasions, she was sent for an MRI scan of the wrist 2 weeks ago, then last week was evaluated by Dr. Celestino Cole who suggested she start formal physical therapy after mentioning that she had fractured her wrist. That made the patient nervous so she elected to seek a second opinion with me here today as I have treated her for bilateral carpal tunnel syndrome in the past.  Additional information regarding MRI from Dr. Philbert Brave note: The report from a recent MRI scan of the left wrist also is available for review and has been reviewed by myself, although the actual images are not available at this time. The report demonstrates evidence of an "acute oblique fracture distal radius/styloid. Osseous edema. Swelling. Hemarthrosis." The study also demonstrates evidence of a "sprained scapholunate ligament", but there is no evidence of any injury to the [facility]. This report also was reviewed by myself and discussed with the patient.   PRECAUTIONS: Other: Per MD: She may return to light duties at work so long as she does not lift, push, pull, or carry more than 10 pounds with the left hand.   WEIGHT BEARING RESTRICTIONS: Yes    PAIN:  Are you having pain? No  FALLS: Has patient fallen in last 6 months? Yes. Number of falls 1  LIVING ENVIRONMENT: Lives with: lives with their family Lives in: House/apartment   PLOF: Independent  PATIENT GOALS: Pt would like to be able to use her hand and wrist normally for daily activities at home and  work.  NEXT MD VISIT: TBD  OBJECTIVE:  Note: Objective measures were completed at Evaluation unless otherwise noted.  HAND DOMINANCE: Right  ADLs: Overall ADLs: Pt reports performing self care tasks independently, with IADLs and work tasks she has difficulty with performing tasks which require bilateral UE use, lifting items (currently restricted to 10#), holding pots and pans (works in [employer], washing dishes, mopping, yard work and opening jars and containers.    FUNCTIONAL OUTCOME MEASURES: TBD  UPPER EXTREMITY ROM:     Active ROM Right eval Left eval  Shoulder flexion    Shoulder abduction    Shoulder adduction    Shoulder extension    Shoulder internal rotation    Shoulder external rotation    Elbow flexion    Elbow extension    Wrist flexion  40  Wrist extension  26  Wrist ulnar deviation  18 pain 4/10  Wrist radial deviation  13  Wrist pronation  82 pain  ulnar side of wrist  Wrist supination  72 with pulling at wrist  (Blank rows = not tested)  Active ROM Right eval Left eval  Thumb MCP (0-60)    Thumb IP (0-80)    Thumb Radial abd/add (0-55)     Thumb Palmar abd/add (0-45)     Thumb Opposition to Small Finger     Index MCP (0-90)  90   Index PIP (0-100)   100  Index DIP (0-70)      Long MCP (0-90)    90  Long PIP (0-100)    90  Long DIP (0-70)      Ring MCP (0-90)    90  Ring PIP (0-100)    90  Ring DIP (0-70)      Little MCP (0-90)    90  Little PIP (0-100)    90  Little DIP (0-70)      (Blank rows = not tested)    HAND FUNCTION: NT at eval with pain at wrist  Grip strength: Right:   lbs; Left:   lbs, Lateral pinch: Right:   lbs, Left:   lbs, 3 point pinch: Right:   lbs, Left:   lbs, and Tip pinch: Right   lbs, Left:   lbs  COORDINATION: Pt reports slower movement patterns and use of hand  SENSATION: Denies any numbness or tingling  EDEMA: mild edema present and inflammation noted internally on MRI  COGNITION: Overall cognitive  status: Within functional limits for tasks assessed  Observations:  Pt reports no pain at rest, increased pain at times on ulnar side of the hand and near .  She has difficulty with strength, ROM and lifting/holding items in left hand.  Full fisting , full opposition of thumb to digits and base of small finger with no pain.  Pain noted with ulnar deviation, supination and pronation of forearm.    TREATMENT DATE: 05/22/2023  Contrast bath:  Time: 8 mins  Location: left hand and wrist  To decrease edema, decrease pain and increase tissue mobility  Therapeutic Exercises:    Tendon glides 10 reps Active wrist flexion/extension, 10 reps Supination/pronation 10 reps Ulnar/radial deviation 10 reps Opposition 10 reps All exercises with cues, gentle ROM and attempting to keep pain down  After ending with warm with contrast, pt improved wrist extension from 26 degrees to 37 degrees.  Circumference of left wrist compared to right showed increases    PATIENT EDUCATION: Education details: contrast, ROM Person educated: Patient Education method: Explanation, Demonstration, and Handouts Education comprehension: verbalized understanding  HOME EXERCISE PROGRAM: Contrast for edema/inflammation, 2-3 times a day, AROM exercises as above following contrast. Pt has limited times she is available to come to therapy with work schedule.  She would need to come first thing in the am or after 5 pm.  GOALS: Goals reviewed with patient? Yes  SHORT TERM GOALS: Target date: 06/14/2023  Pt to demonstrate understanding of modifications to work tasks at avoid lifting greater than 10#. Baseline: Pt with limited knowledge at eval regarding modifications for work tasks. Goal status: INITIAL   LONG TERM GOALS: Target date: 07/05/2023  Pt will demonstrate improved left wrist motion in both  flexion and extension by 20 degrees to perform work related tasks with modified independence. Baseline: limited ROM in  left wrist Goal status: INITIAL  2.  Pt will demonstrate ability to open jars and containers with modified independence. Baseline: difficulty at eval  Goal status: INITIAL  3.  Pt to  demonstrate mopping with modified independence and pain 2/10 or less in left wrist Baseline: unable at eval Goal status: INITIAL  4.  Pt to increase strength in left wrist to be able to hold pots and pans during work related and home tasks in the kitchen. Baseline: difficulty at eval  Goal status: INITIAL  5.  Pt to demonstrate improved left hand function to be able to wash dishes with modified independence. Baseline: difficult at eval  Goal status: INITIAL    ASSESSMENT:  CLINICAL IMPRESSION: Patient is a 43 y.o. female who was seen today for occupational therapy evaluation for Closed nondisplaced fracture of styloid process of left radius, Sprain of left scapholunate ligament, left wrist pain which is affecting her ability to perform work related and home management tasks.  Pt presents with decreased ROM, pain, strength and decreased functional use of her left wrist and hand.  Pt has recently returned to work at the beginning of this month with restrictions of no lifting, push or pull greater than 10# and wearing brace at all times.  She responded well in session with use of contrast and able to demonstrate an increase of wrist extension by 11 degrees.  She has limited times she is able to attend therapy based on her work schedule but will be done with school for the summer in a couple weeks in which she will have more flexibility. She would benefit from skilled OT services to maximize safety and independence in necessary daily tasks at home, work and in the community.   PERFORMANCE DEFICITS: in functional skills including ADLs, IADLs, sensation, edema, ROM, strength, pain, fascial restrictions, flexibility, and UE functional use, and psychosocial skills including environmental adaptation, habits, and  routines and behaviors.   IMPAIRMENTS: are limiting patient from ADLs, IADLs, work, and leisure.   COMORBIDITIES: may have co-morbidities  that affects occupational performance. Patient will benefit from skilled OT to address above impairments and improve overall function.  MODIFICATION OR ASSISTANCE TO COMPLETE EVALUATION: Min-Moderate modification of tasks or assist with assess necessary to complete an evaluation.  OT OCCUPATIONAL PROFILE AND HISTORY: Detailed assessment: Review of records and additional review of physical, cognitive, psychosocial history related to current functional performance.  CLINICAL DECISION MAKING: Moderate - several treatment options, min-mod task modification necessary  REHAB POTENTIAL: Good  EVALUATION COMPLEXITY: Moderate      PLAN:  OT FREQUENCY: 1-2x/week  OT DURATION: 6 weeks  PLANNED INTERVENTIONS: 97168 OT Re-evaluation, 97535 self care/ADL training, 25366 therapeutic exercise, 97530 therapeutic activity, 97112 neuromuscular re-education, 97140 manual therapy, 97035 ultrasound, 97018 paraffin, 44034 fluidotherapy, 97010 moist heat, 97010 cryotherapy, 97034 contrast bath, scar mobilization, passive range of motion, coping strategies training, patient/family education, and DME and/or AE instructions  RECOMMENDED OTHER SERVICES: none  CONSULTED AND AGREED WITH PLAN OF CARE: Patient  PLAN FOR NEXT SESSION:   Antone Batten, OTR/L, CLT  Emmanuella Mirante, OT 06/01/2023, 8:13 PM

## 2023-06-03 ENCOUNTER — Ambulatory Visit: Attending: Surgery | Admitting: Occupational Therapy

## 2023-06-03 DIAGNOSIS — M25632 Stiffness of left wrist, not elsewhere classified: Secondary | ICD-10-CM

## 2023-06-03 DIAGNOSIS — M25532 Pain in left wrist: Secondary | ICD-10-CM | POA: Diagnosis present

## 2023-06-03 DIAGNOSIS — M6281 Muscle weakness (generalized): Secondary | ICD-10-CM | POA: Diagnosis present

## 2023-06-03 DIAGNOSIS — M79642 Pain in left hand: Secondary | ICD-10-CM | POA: Diagnosis present

## 2023-06-03 DIAGNOSIS — L905 Scar conditions and fibrosis of skin: Secondary | ICD-10-CM | POA: Diagnosis present

## 2023-06-03 NOTE — Therapy (Signed)
 OUTPATIENT OCCUPATIONAL THERAPY ORTHO TREATMENT  Patient Name: Margaret Gates MRN: [id number] DOB:Nov 02, 1980, 43 y.o., female Today's Date: 06/03/2023  PCP: Donny Gall, FNP REFERRING PROVIDER: Delayne Feather, MD  END OF SESSION:  OT End of Session - 06/03/23 1432     Visit Number 2    Number of Visits 13    Date for OT Re-Evaluation 07/05/23    OT Start Time 1432    OT Stop Time 1512    OT Time Calculation (min) 40 min    Activity Tolerance Patient tolerated treatment well    Behavior During Therapy [facility] for tasks assessed/performed             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 (HCC)    Asthma    Bronchitis    GERD (gastroesophageal reflux disease)    Headache    Severe sepsis (HCC) 01/26/2021   Tobacco use    Past Surgical History:  Procedure Laterality Date   CARPAL TUNNEL RELEASE Right 08/31/2020   Procedure: CARPAL TUNNEL RELEASE ENDOSCOPIC;  Surgeon: Elner Hahn, MD;  Location: [facility];  Service: Orthopedics;  Laterality: Right;   CARPAL TUNNEL RELEASE Left 10/26/2020   Procedure: CARPAL TUNNEL RELEASE ENDOSCOPIC;  Surgeon: Elner Hahn, MD;  Location: [facility];  Service: Orthopedics;  Laterality: Left;   CARPAL TUNNEL RELEASE Right 08/22/2021   Procedure: OPEN REVISION RIGHT CARPAL TUNNEL RELEASE;  Surgeon: Elner Hahn, MD;  Location: [facility];  Service: Orthopedics;  Laterality: Right;   CARPAL TUNNEL RELEASE Left 11/15/2021   Procedure: Revision open left carpal tunnel release with subcutaneous transposition of ulnar nerve at left elbow;  Surgeon: Elner Hahn, MD;  Location: [facility];  Service: Orthopedics;  Laterality: Left;   CESAREAN SECTION     HIP SURGERY Bilateral    LAPROSCOPIC BONE SPURS   HIP SURGERY Bilateral    ULNAR NERVE TRANSPOSITION Right 08/22/2021   Procedure: SUBCUTANEOUS TRANSPOSITION OF THE ULNAR NERVE AT RIGHT ELBOW.;  Surgeon: Elner Hahn, MD;  Location: [facility];   Service: Orthopedics;  Laterality: Right;   ULNAR NERVE TRANSPOSITION Left 11/15/2021   Procedure: ULNAR NERVE DECOMPRESSION/TRANSPOSITION;  Surgeon: Elner Hahn, MD;  Location: [facility];  Service: Orthopedics;  Laterality: Left;   UPPER GI ENDOSCOPY  2020   Patient Active Problem List   Diagnosis Date Noted   Cubital tunnel syndrome, right 08/18/2021   Bilateral thumb pain 04/03/2021   Adrenal insufficiency (HCC) 01/26/2021   Asthma 01/26/2021   GERD (gastroesophageal reflux disease) 01/26/2021   Tobacco use 01/26/2021   CAP (community acquired pneumonia) 01/26/2021   Carpal tunnel syndrome, right 08/19/2020   Tear of left acetabular labrum 05/06/2015   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  The pain is better.  My motion is better but the up-and-down is  the worst and then at my thumb. Pt accompanied by: self  PERTINENT HISTORY: Per chart from MD visit with Dr. Daun Epstein: KIMBERLI WINNE is a 43 y.o. female who presents for evaluation and treatment of her left wrist injury. Apparently, the patient fell onto her outstretched left hand while rollerskating 2 months ago. Initially, she presented to the [facility] urgent care clinic where she was diagnosed with a possible scaphoid fracture and placed into a long-arm cast. She followed up with  emergency providers on several occasions and was told by varying providers that she had sprained her wrist or that she had fractured her wrist. She has been in a Velcro wrist immobilizer since the cast was removed a week to 10 days after it was first applied. After asking several occasions, she was sent for an MRI scan of the wrist 2 weeks ago, then last week was evaluated by Dr. Celestino Cole who suggested she start formal physical therapy after mentioning that she had fractured her wrist. That made the patient nervous so she elected to seek a second opinion with me here today as I have treated her for bilateral carpal tunnel syndrome in the past.  Additional information regarding MRI from Dr. Philbert Brave note: The report from a recent MRI scan of the left wrist also is available for review and has been reviewed by myself, although the actual images are not available at this time. The report demonstrates evidence of an "acute oblique fracture distal radius/styloid. Osseous edema. Swelling. Hemarthrosis." The study also demonstrates evidence of a "sprained scapholunate ligament", but there is no evidence of any injury to the [facility] complex. This report also was reviewed by myself and discussed with the patient.   PRECAUTIONS: Other: Per MD: She may return to light duties at work so long as she does not lift, push, pull, or carry more than 10 pounds with the left hand.   WEIGHT BEARING RESTRICTIONS: Yes    PAIN:  Are you having pain? 4-8/10 pain dorsal wrist and radial wrist  FALLS: Has patient fallen in last 6 months? Yes. Number of falls 1  LIVING ENVIRONMENT: Lives with: lives with their family Lives in: House/apartment   PLOF: Independent  PATIENT GOALS: Pt would like to be able to use her hand and wrist normally for daily activities at home and work.  NEXT MD VISIT: TBD  OBJECTIVE:  Note: Objective measures were completed at Evaluation unless otherwise noted.  HAND DOMINANCE: Right  ADLs: Overall  ADLs: Pt reports performing self care tasks independently, with IADLs and work tasks she has difficulty with performing tasks which require bilateral UE use, lifting items (currently restricted to 10#), holding pots and pans (works in [employer], washing dishes, mopping, yard work and opening jars and containers.    FUNCTIONAL OUTCOME MEASURES: TBD  UPPER EXTREMITY ROM:     Active ROM Right eval Left eval L 06/03/23  Shoulder flexion     Shoulder abduction     Shoulder adduction     Shoulder extension     Shoulder internal rotation     Shoulder external rotation     Elbow flexion     Elbow extension     Wrist flexion  40 70 open fist 62  Wrist extension  26 56 and 50 fist   Wrist ulnar deviation  18 pain 4/10 25 radial wrist pain   Wrist radial deviation  13 25 radial wrist pain   Wrist pronation  82 pain ulnar side of wrist 90  Wrist supination  72 with pulling at wrist 90  (Blank rows = not tested)  Active ROM Right eval Left eval  Thumb MCP (0-60)    Thumb IP (0-80)    Thumb Radial abd/add (0-55)     Thumb Palmar abd/add (0-45)     Thumb Opposition to Small Finger     Index MCP (0-90)  90   Index PIP (0-100)   100  Index DIP (0-70)      Long MCP (0-90)    90  Long PIP (0-100)    90  Long DIP (0-70)      Ring MCP (0-90)    90  Ring PIP (0-100)    90  Ring DIP (0-70)      Little MCP (0-90)    90  Little PIP (0-100)    90  Little DIP (0-70)      (Blank rows = not tested)    HAND FUNCTION: NT at eval with pain at wrist  06/03/23 Grip strength: Right: 95 lbs; Left: 72 lbs, Lateral pinch: Right: 17 lbs, Left: 16 lbs, 3 point pinch: Right: 13 lbs, Left: 14 lbs,  pain radial wrist grip and 3 point   COORDINATION: Pt reports slower movement patterns and use of hand  SENSATION: Denies any numbness or tingling  EDEMA: mild edema present and inflammation noted internally on MRI  COGNITION: Overall cognitive status: Within functional limits for tasks  assessed  Observations:  at eval  Pt reports no pain at rest, increased pain at times on ulnar side of the hand and near .  She has difficulty with strength, ROM and lifting/holding items in left hand.  Full fisting , full opposition of thumb to digits and base of small finger with no pain.  Pain noted with ulnar deviation, supination and pronation of forearm.    TREATMENT DATE: 06/03/2023 Measurements taken upon arrival.  See flowsheet for progress Fluidotherapy  Time: 8 mins  Location: left hand and wrist  To decrease stiffness and pain- increased motion  Therapeutic Exercises:    Tendon glides 10 reps Review AAROM over edge of table for RD, UD and wrist flexion, ext 10 reps pain free  Slight pull less 2/10 1 lbs for Supination/pronation 10 reps AROM Ulnar/radial deviation 10 reps Opposition 10 reps     PATIENT EDUCATION: Education details: contrast, ROM Person educated: Patient Education method: Programmer, multimedia, Demonstration, and Handouts Education comprehension: verbalized understanding  HOME EXERCISE PROGRAM: Contrast for edema/inflammation, 2-3 times a day, AROM exercises as above following contrast. Pt has limited times she is available to come to therapy with work schedule.  She would need to come first thing in the am or after 5 pm.  GOALS: Goals reviewed with patient? Yes  SHORT TERM GOALS: Target date: 06/14/2023  Pt to demonstrate understanding of modifications to work tasks at avoid lifting greater than 10#. Baseline: Pt with limited knowledge at eval regarding modifications for work tasks. Goal status: INITIAL   LONG TERM GOALS: Target date: 07/05/2023  Pt will demonstrate improved left wrist motion in both  flexion and extension by 20 degrees to perform work related tasks with modified independence. Baseline: limited ROM in left wrist Goal status: INITIAL  2.  Pt will demonstrate ability to open jars and containers with modified independence. Baseline:  difficulty at eval  Goal status: INITIAL  3.  Pt to demonstrate mopping with modified independence and pain 2/10 or less in left wrist Baseline: unable at eval Goal status: INITIAL  4.  Pt to increase strength in left wrist to be able to hold pots and pans during work related and home tasks in the kitchen. Baseline: difficulty at eval  Goal status: INITIAL  5.  Pt to demonstrate improved left hand function to be able to wash dishes with modified independence. Baseline: difficult at eval  Goal status: INITIAL    ASSESSMENT:  CLINICAL IMPRESSION: Patient is a 43 y.o. female who was seen today for occupational therapy for Closed nondisplaced fracture of styloid process of left radius, Sprain of left scapholunate ligament, left wrist pain which is affecting her ability to perform work related and home management tasks.  Pt presented at OT eval with decreased ROM, pain, strength and decreased functional use of her left wrist and hand.  Pt has recently returned to work at the beginning of this month with restrictions of no lifting, push or pull greater than 10# and wearing brace at all times.  Patient presented for his follow-up appointment with great progress in pain as well as increased motion.  Mostly limited in wrist flexion extension as well as radial ulnar deviation with pain fluctuating from a 4-8/10 over the dorsal wrist and radial.  She has limited times she is able to attend therapy based on her work schedule but will be done with school for the summer in a couple weeks in which she will have more flexibility. She would benefit from skilled OT services to maximize safety and independence in necessary daily tasks at home, work and in the community.   PERFORMANCE DEFICITS: in functional skills including ADLs, IADLs, sensation, edema, ROM, strength, pain, fascial restrictions, flexibility, and UE functional use, and psychosocial skills including environmental adaptation, habits, and routines  and behaviors.   IMPAIRMENTS: are limiting patient from ADLs, IADLs, work, and leisure.   COMORBIDITIES: may have co-morbidities  that affects occupational performance. Patient will benefit from skilled OT to address above impairments and improve overall function.  MODIFICATION OR ASSISTANCE TO COMPLETE EVALUATION: Min-Moderate modification of tasks or assist with assess necessary to complete an evaluation.  OT OCCUPATIONAL PROFILE AND HISTORY: Detailed assessment: Review of records and additional review of physical, cognitive, psychosocial history related to current functional performance.  CLINICAL DECISION MAKING: Moderate - several treatment options, min-mod task modification necessary  REHAB POTENTIAL: Good  EVALUATION COMPLEXITY: Moderate      PLAN:  OT FREQUENCY: 1-2x/week  OT DURATION: 6 weeks  PLANNED INTERVENTIONS: 97168 OT Re-evaluation, 97535 self care/ADL training, 19147 therapeutic exercise, 97530 therapeutic activity, 97112 neuromuscular re-education, 97140 manual therapy, 97035 ultrasound, 97018 paraffin, 82956 fluidotherapy, 97010 moist heat, 97010 cryotherapy, 97034 contrast bath, scar mobilization, passive range of motion, coping strategies training, patient/family education, and DME and/or AE instructions  RECOMMENDED OTHER SERVICES: none  CONSULTED AND AGREED WITH PLAN OF CARE: Patient  PLAN FOR NEXT SESSION:     Heloise Lobo, OTR/L,CLT 06/03/2023, 4:48 PM

## 2023-06-11 ENCOUNTER — Ambulatory Visit: Admitting: Occupational Therapy

## 2023-06-11 DIAGNOSIS — M6281 Muscle weakness (generalized): Secondary | ICD-10-CM | POA: Diagnosis not present

## 2023-06-11 DIAGNOSIS — M25632 Stiffness of left wrist, not elsewhere classified: Secondary | ICD-10-CM

## 2023-06-11 DIAGNOSIS — M25532 Pain in left wrist: Secondary | ICD-10-CM

## 2023-06-11 NOTE — Therapy (Signed)
 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)    Asthma    Bronchitis    GERD (gastroesophageal reflux disease)    Headache    Severe sepsis (HCC) 01/26/2021   Tobacco use    Past Surgical History:  Procedure Laterality Date   CARPAL TUNNEL RELEASE Right 08/31/2020   Procedure: CARPAL TUNNEL RELEASE ENDOSCOPIC;  Surgeon: Elner Hahn, MD;  Location: [facility];  Service: Orthopedics;  Laterality: Right;   CARPAL TUNNEL RELEASE Left 10/26/2020   Procedure: CARPAL TUNNEL RELEASE ENDOSCOPIC;  Surgeon: Elner Hahn, MD;  Location: [facility];  Service: Orthopedics;  Laterality: Left;   CARPAL TUNNEL RELEASE Right 08/22/2021   Procedure: OPEN REVISION RIGHT CARPAL TUNNEL RELEASE;  Surgeon: Elner Hahn, MD;  Location: [facility];  Service: Orthopedics;  Laterality: Right;   CARPAL TUNNEL RELEASE Left 11/15/2021   Procedure: Revision open left carpal tunnel release with subcutaneous transposition of ulnar nerve at left elbow;  Surgeon: Elner Hahn, MD;  Location: [facility];  Service: Orthopedics;  Laterality: Left;   CESAREAN SECTION     HIP SURGERY Bilateral    LAPROSCOPIC BONE SPURS   HIP SURGERY Bilateral    ULNAR NERVE TRANSPOSITION Right 08/22/2021   Procedure: SUBCUTANEOUS TRANSPOSITION OF THE ULNAR NERVE AT RIGHT ELBOW.;  Surgeon: Elner Hahn, MD;  Location: [facility];  Service: Orthopedics;  Laterality: Right;   ULNAR NERVE  TRANSPOSITION Left 11/15/2021   Procedure: ULNAR NERVE DECOMPRESSION/TRANSPOSITION;  Surgeon: Elner Hahn, MD;  Location: [facility];  Service: Orthopedics;  Laterality: Left;   UPPER GI ENDOSCOPY  2020   Patient Active Problem List   Diagnosis Date Noted   Cubital tunnel syndrome, right 08/18/2021   Bilateral thumb pain 04/03/2021   Adrenal insufficiency (HCC) 01/26/2021   Asthma 01/26/2021   GERD (gastroesophageal reflux disease) 01/26/2021   Tobacco use 01/26/2021   CAP (community acquired pneumonia) 01/26/2021   Carpal tunnel syndrome, right 08/19/2020   Tear of left acetabular labrum 05/06/2015   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 Noelia Gates told me to focus and help with the wrist flexion and extension stretches.  School is out now so I have been wearing my splint less.  My motion is better - pain is better.  I am trying to use it more.  But I do put the splint on if I am doing  something heavy activity. Pt accompanied by: self  PERTINENT HISTORY: Per chart from MD visit with Dr. Daun Gates: Margaret Gates is a 43 y.o. female who presents for evaluation and treatment of her left wrist injury. Apparently, the patient fell onto her outstretched left hand while rollerskating 2 months ago. Initially, she presented to the [facility] urgent care clinic where she was diagnosed with a possible scaphoid fracture and placed into a long-arm cast. She followed up with emergency  providers on several occasions and was told by varying providers that she had sprained her wrist or that she had fractured her wrist. She has been in a Velcro wrist immobilizer since the cast was removed a week to 10 days after it was first applied. After asking several occasions, she was sent for an MRI scan of the wrist 2 weeks ago, then last week was evaluated by Dr. Celestino Gates who suggested she start formal physical therapy after mentioning that she had fractured her wrist. That made the patient nervous so she elected to seek a second opinion with me here today as I have treated her for bilateral carpal tunnel syndrome in the past.  Additional information regarding MRI from Dr. Philbert Gates note: The report from a recent MRI scan of the left wrist also is available for review and has been reviewed by myself, although the actual images are not available at this time. The report demonstrates evidence of an "acute oblique fracture distal radius/styloid. Osseous edema. Swelling. Hemarthrosis." The study also demonstrates evidence of a "sprained scapholunate ligament", but there is no evidence of any injury to the [facility] complex. This report also was reviewed by myself and discussed with the patient.   PRECAUTIONS: Other: Per MD: She may return to light duties at work so long as she does not lift, push, pull, or carry more than 10 pounds with the left hand.   WEIGHT BEARING RESTRICTIONS: Yes    PAIN:  Are you having pain?  Patient denies pain  FALLS: Has patient fallen in last 6 months? Yes. Number of falls 1  LIVING ENVIRONMENT: Lives with: lives with their family Lives in: House/apartment   PLOF: Independent  PATIENT GOALS: Pt would like to be able to use her hand and wrist normally for daily activities at home and work.  NEXT MD VISIT: TBD  OBJECTIVE:  Note: Objective measures were completed at Evaluation unless otherwise noted.  HAND DOMINANCE: Right  ADLs: Overall ADLs: Pt reports performing self  care tasks independently, with IADLs and work tasks she has difficulty with performing tasks which require bilateral UE use, lifting items (currently restricted to 10#), holding pots and pans (works in [employer], washing dishes, mopping, yard work and opening jars and containers.    FUNCTIONAL OUTCOME MEASURES: TBD  UPPER EXTREMITY ROM:     Active ROM Right eval Left eval L 06/03/23 L 06/11/23  Shoulder flexion      Shoulder abduction      Shoulder adduction      Shoulder extension      Shoulder internal rotation      Shoulder external rotation      Elbow flexion      Elbow extension      Wrist flexion  40 70 open fist 62 70 open hand and close fist in session 85   Wrist extension  26 56 and 50 fist  50 in session 70   Wrist ulnar deviation  18 pain 4/10 25 radial wrist pain  30  Wrist radial  deviation  13 25 radial wrist pain  25  Wrist pronation  82 pain ulnar side of wrist 90 90  Wrist supination  72 with pulling at wrist 90 90  (Blank rows = not tested)  Active ROM Right eval Left eval  Thumb MCP (0-60)    Thumb IP (0-80)    Thumb Radial abd/add (0-55)     Thumb Palmar abd/add (0-45)     Thumb Opposition to Small Finger     Index MCP (0-90)  90   Index PIP (0-100)   100  Index DIP (0-70)      Long MCP (0-90)    90  Long PIP (0-100)    90  Long DIP (0-70)      Ring MCP (0-90)    90  Ring PIP (0-100)    90  Ring DIP (0-70)      Little MCP (0-90)    90  Little PIP (0-100)    90  Little DIP (0-70)      (Blank rows = not tested)    HAND FUNCTION: NT at eval with pain at wrist  06/03/23 Grip strength: Right: 95 lbs; Left: 72 lbs, Lateral pinch: Right: 17 lbs, Left: 16 lbs, 3 point pinch: Right: 13 lbs, Left: 14 lbs,  pain radial wrist grip and 3 point  06/11/23 Grip strength: Right: 95 lbs; Left: 80 lbs, Lateral pinch: Right: 17 lbs, Left: 16 lbs, 3 point pinch: Right: 14 lbs, Left: 13 lbs,    COORDINATION: Pt reports slower movement patterns and use of  hand  SENSATION: Denies any numbness or tingling  EDEMA: mild edema present and inflammation noted internally on MRI  COGNITION: Overall cognitive status: Within functional limits for tasks assessed  Observations:  at eval  Pt reports no pain at rest, increased pain at times on ulnar side of the hand and near .  She has difficulty with strength, ROM and lifting/holding items in left hand.  Full fisting , full opposition of thumb to digits and base of small finger with no pain.  Pain noted with ulnar deviation, supination and pronation of forearm.    TREATMENT DATE: 06/11/2023    Patient arrived after follow-up with orthopedics.  Patient show increase wrist active range of motion in all planes except extension.  Patient with less pain.  And reporting increased use and weaning more out of wrist splint. See flowsheet for progress.  As well as increase grip and prehension strength.  See flowsheet.  Fluidotherapy  Time: 8 mins  Location: left hand and wrist  To decrease stiffness and pain- increased motion  Therapeutic Exercises:    Tendon glides 10 reps especially in the morning if increase stiffness Patient's ulnar radial deviation within normal limits and pain-free as well as supination pronation. Strength assessment showed 5/5 strength in her range for radial ulnar deviation as well as wrist flexion extension pain-free Supination pronation 5 -/5 strength. Reviewed with home program for patient to focus after heat prolonged stretch for wrist flexion 10 reps followed by composite flexion 10 reps Great progress for wrist flexion 85 degrees. Followed by soft tissue lopes over right roller Followed by prayer stretch 10 reps hold 5 seconds Reviewing at this date for patient table slides.  20 reps pain-free With a great progress in wrist extension 70 degrees     PATIENT EDUCATION: Education details: contrast, ROM Person educated: Patient Education method: Explanation,  Demonstration, and Handouts Education comprehension: verbalized understanding  GOALS: Goals reviewed with  patient? Yes  SHORT TERM GOALS: Target date: 06/14/2023  Pt to demonstrate understanding of modifications to work tasks at avoid lifting greater than 10#. Baseline: Pt with limited knowledge at eval regarding modifications for work tasks. Goal status: INITIAL   LONG TERM GOALS: Target date: 07/05/2023  Pt will demonstrate improved left wrist motion in both  flexion and extension by 20 degrees to perform work related tasks with modified independence. Baseline: limited ROM in left wrist Goal status: INITIAL  2.  Pt will demonstrate ability to open jars and containers with modified independence. Baseline: difficulty at eval  Goal status: INITIAL  3.  Pt to demonstrate mopping with modified independence and pain 2/10 or less in left wrist Baseline: unable at eval Goal status: INITIAL  4.  Pt to increase strength in left wrist to be able to hold pots and pans during work related and home tasks in the kitchen. Baseline: difficulty at eval  Goal status: INITIAL  5.  Pt to demonstrate improved left hand function to be able to wash dishes with modified independence. Baseline: difficult at eval  Goal status: INITIAL    ASSESSMENT:  CLINICAL IMPRESSION: Patient is a 43 y.o. female who was seen today for occupational therapy for Closed nondisplaced fracture of styloid process of left radius, Sprain of left scapholunate ligament, left wrist pain which is affecting her ability to perform work related and home management tasks.  Pt presented at OT eval with decreased ROM, pain, strength and decreased functional use of her left wrist and hand.  Patient is on summer break.  From school.  Patient reports only wearing wrist brace with heavy activities around the house.  Patient presented for his follow-up appointment with great progress in pain as well as increased motion in all planes.  As  well as increase strength..  Mostly limited in wrist flexion extension -patient to focus on prolonged stretch and passive range of motion for composite flexion and extension.  Reviewed with patient home program.  Patient showed great progress in session.  Patient with increased grip and prehension strength.  Patient decreased to follow-up in 2 weeks.  Aaron Aas She would benefit from skilled OT services to maximize safety and independence in necessary daily tasks at home, work and in the community.   PERFORMANCE DEFICITS: in functional skills including ADLs, IADLs, sensation, edema, ROM, strength, pain, fascial restrictions, flexibility, and UE functional use, and psychosocial skills including environmental adaptation, habits, and routines and behaviors.   IMPAIRMENTS: are limiting patient from ADLs, IADLs, work, and leisure.   COMORBIDITIES: may have co-morbidities  that affects occupational performance. Patient will benefit from skilled OT to address above impairments and improve overall function.  MODIFICATION OR ASSISTANCE TO COMPLETE EVALUATION: Min-Moderate modification of tasks or assist with assess necessary to complete an evaluation.  OT OCCUPATIONAL PROFILE AND HISTORY: Detailed assessment: Review of records and additional review of physical, cognitive, psychosocial history related to current functional performance.  CLINICAL DECISION MAKING: Moderate - several treatment options, min-mod task modification necessary  REHAB POTENTIAL: Good  EVALUATION COMPLEXITY: Moderate      PLAN:  OT FREQUENCY: Biweekly  OT DURATION: 6 weeks  PLANNED INTERVENTIONS: 97168 OT Re-evaluation, 97535 self care/ADL training, 16109 therapeutic exercise, 97530 therapeutic activity, 97112 neuromuscular re-education, 97140 manual therapy, 97035 ultrasound, 97018 paraffin, 60454 fluidotherapy, 97010 moist heat, 97010 cryotherapy, 97034 contrast bath, scar mobilization, passive range of motion, coping strategies  training, patient/family education, and DME and/or AE instructions  RECOMMENDED OTHER SERVICES: none  CONSULTED  AND AGREED WITH PLAN OF CARE: Patient  PLAN FOR NEXT SESSION:     Heloise Lobo, OTR/L,CLT 06/11/2023, 3:56 PM

## 2023-06-14 ENCOUNTER — Ambulatory Visit: Admitting: Occupational Therapy

## 2023-06-17 ENCOUNTER — Ambulatory Visit: Admitting: Occupational Therapy

## 2023-06-17 DIAGNOSIS — M6281 Muscle weakness (generalized): Secondary | ICD-10-CM | POA: Diagnosis not present

## 2023-06-17 DIAGNOSIS — M25532 Pain in left wrist: Secondary | ICD-10-CM

## 2023-06-17 DIAGNOSIS — M25632 Stiffness of left wrist, not elsewhere classified: Secondary | ICD-10-CM

## 2023-06-17 NOTE — Therapy (Signed)
 OUTPATIENT OCCUPATIONAL THERAPY ORTHO TREATMENT  Patient Name: Margaret Gates MRN: [id number] DOB:[birth year], 43 y.o., female Today's Date: 06/17/2023  PCP: Donny Gall, FNP REFERRING PROVIDER: Delayne Feather, MD  END OF SESSION:  OT End of Session - 06/17/23 1350     Visit Number 4    Number of Visits 13    Date for OT Re-Evaluation 07/05/23    OT Start Time 1350    OT Stop Time 1420    OT Time Calculation (min) 30 min    Activity Tolerance Patient tolerated treatment well    Behavior During Therapy WFL for tasks assessed/performed          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 (HCC)    Asthma    Bronchitis    GERD (gastroesophageal reflux disease)    Headache    Severe sepsis (HCC) 01/26/2021   Tobacco use    Past Surgical History:  Procedure Laterality Date   CARPAL TUNNEL RELEASE Right 08/31/2020   Procedure: CARPAL TUNNEL RELEASE ENDOSCOPIC;  Surgeon: Elner Hahn, MD;  Location: [facility];  Service: Orthopedics;  Laterality: Right;   CARPAL TUNNEL RELEASE Left 10/26/2020   Procedure: CARPAL TUNNEL RELEASE ENDOSCOPIC;  Surgeon: Elner Hahn, MD;  Location: [facility];  Service: Orthopedics;  Laterality: Left;   CARPAL TUNNEL RELEASE Right 08/22/2021   Procedure: OPEN REVISION RIGHT CARPAL TUNNEL RELEASE;  Surgeon: Elner Hahn, MD;  Location: [facility];  Service: Orthopedics;  Laterality: Right;   CARPAL TUNNEL RELEASE Left 11/15/2021   Procedure: Revision open left carpal tunnel release with subcutaneous transposition of ulnar nerve at left elbow;  Surgeon: Elner Hahn, MD;  Location: [facility];  Service: Orthopedics;  Laterality: Left;   CESAREAN SECTION     HIP SURGERY Bilateral    LAPROSCOPIC BONE SPURS   HIP SURGERY Bilateral    ULNAR NERVE TRANSPOSITION Right 08/22/2021   Procedure: SUBCUTANEOUS TRANSPOSITION OF THE ULNAR NERVE AT RIGHT ELBOW.;  Surgeon: Elner Hahn, MD;  Location: [facility];  Service:  Orthopedics;  Laterality: Right;   ULNAR NERVE TRANSPOSITION Left 11/15/2021   Procedure: ULNAR NERVE DECOMPRESSION/TRANSPOSITION;  Surgeon: Elner Hahn, MD;  Location: [facility];  Service: Orthopedics;  Laterality: Left;   UPPER GI ENDOSCOPY  2020   Patient Active Problem List   Diagnosis Date Noted   Cubital tunnel syndrome, right 08/18/2021   Bilateral thumb pain 04/03/2021   Adrenal insufficiency (HCC) 01/26/2021   Asthma 01/26/2021   GERD (gastroesophageal reflux disease) 01/26/2021   Tobacco use 01/26/2021   CAP (community acquired pneumonia) 01/26/2021   Carpal tunnel syndrome, right 08/19/2020   Tear of left acetabular labrum 05/06/2015   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   Nicotine  dependence, uncomplicated 01/22/2013    ONSET DATE: 02/17/2023  REFERRING DIAG: Closed nondisplaced fracture of styloid process of left radius, Sprain of left scapholunate ligament  , left wrist pain  THERAPY DIAG:  Muscle weakness (generalized)  Pain in left wrist  Stiffness of left wrist, not elsewhere classified  Rationale for Evaluation and Treatment: Rehabilitation  SUBJECTIVE:   SUBJECTIVE STATEMENT: I have been working the flexion , extention - caught myself the other day and my wrist went back little- so I strained it little  it is sore in the back  - doing better over all  Pt accompanied by: self  PERTINENT HISTORY: Per chart from MD visit  with Dr. Daun Epstein: Margaret Gates is a 43 y.o. female who presents for evaluation and treatment of her left wrist injury. Apparently, the patient fell onto her outstretched left hand while rollerskating 2 months ago. Initially, she presented to the [facility] urgent care clinic where she was diagnosed with a possible scaphoid fracture and placed into a long-arm cast. She followed up with emergency providers on several occasions and was told by varying providers that she had  sprained her wrist or that she had fractured her wrist. She has been in a Velcro wrist immobilizer since the cast was removed a week to 10 days after it was first applied. After asking several occasions, she was sent for an MRI scan of the wrist 2 weeks ago, then last week was evaluated by Dr. Celestino Cole who suggested she start formal physical therapy after mentioning that she had fractured her wrist. That made the patient nervous so she elected to seek a second opinion with me here today as I have treated her for bilateral carpal tunnel syndrome in the past.  Additional information regarding MRI from Dr. Philbert Brave note: The report from a recent MRI scan of the left wrist also is available for review and has been reviewed by myself, although the actual images are not available at this time. The report demonstrates evidence of an acute oblique fracture distal radius/styloid. Osseous edema. Swelling. Hemarthrosis. The study also demonstrates evidence of a sprained scapholunate ligament, but there is no evidence of any injury to the [facility] complex. This report also was reviewed by myself and discussed with the patient.   PRECAUTIONS: Other: Per MD: She may return to light duties at work so long as she does not lift, push, pull, or carry more than 10 pounds with the left hand.   WEIGHT BEARING RESTRICTIONS: Yes    PAIN:  Are you having pain?  4-5/10 posterior wrist   FALLS: Has patient fallen in last 6 months? Yes. Number of falls 1  LIVING ENVIRONMENT: Lives with: lives with their family Lives in: House/apartment   PLOF: Independent  PATIENT GOALS: Pt would like to be able to use her hand and wrist normally for daily activities at home and work.  NEXT MD VISIT: TBD  OBJECTIVE:  Note: Objective measures were completed at Evaluation unless otherwise noted.  HAND DOMINANCE: Right  ADLs: Overall ADLs: Pt reports performing self care tasks independently, with IADLs and work tasks she has difficulty  with performing tasks which require bilateral UE use, lifting items (currently restricted to 10#), holding pots and pans (works in [employer], washing dishes, mopping, yard work and opening jars and containers.    FUNCTIONAL OUTCOME MEASURES: TBD  UPPER EXTREMITY ROM:     Active ROM Right eval Left eval L 06/03/23 L 06/11/23 L 06/17/23  Shoulder flexion       Shoulder abduction       Shoulder adduction       Shoulder extension       Shoulder internal rotation       Shoulder external rotation       Elbow flexion       Elbow extension       Wrist flexion  40 70 open fist 62 70 open hand and close fist in session 85  80 to 85  Wrist extension  26 56 and 50 fist  50 in session 70  65-70  Wrist ulnar deviation  18 pain 4/10 25 radial wrist pain  30 30  Wrist  radial deviation  13 25 radial wrist pain  25 20  Wrist pronation  82 pain ulnar side of wrist 90 90 90  Wrist supination  72 with pulling at wrist 90 90 90  (Blank rows = not tested)  Active ROM Right eval Left eval  Thumb MCP (0-60)    Thumb IP (0-80)    Thumb Radial abd/add (0-55)     Thumb Palmar abd/add (0-45)     Thumb Opposition to Small Finger     Index MCP (0-90)  90   Index PIP (0-100)   100  Index DIP (0-70)      Long MCP (0-90)    90  Long PIP (0-100)    90  Long DIP (0-70)      Ring MCP (0-90)    90  Ring PIP (0-100)    90  Ring DIP (0-70)      Little MCP (0-90)    90  Little PIP (0-100)    90  Little DIP (0-70)      (Blank rows = not tested)    HAND FUNCTION: NT at eval with pain at wrist  06/03/23 Grip strength: Right: 95 lbs; Left: 72 lbs, Lateral pinch: Right: 17 lbs, Left: 16 lbs, 3 point pinch: Right: 13 lbs, Left: 14 lbs,  pain radial wrist grip and 3 point  06/11/23 Grip strength: Right: 95 lbs; Left: 80 lbs, Lateral pinch: Right: 17 lbs, Left: 16 lbs, 3 point pinch: Right: 14 lbs, Left: 13 lbs,    COORDINATION: Pt reports slower movement patterns and use of hand  SENSATION: Denies any  numbness or tingling  EDEMA: mild edema present and inflammation noted internally on MRI  COGNITION: Overall cognitive status: Within functional limits for tasks assessed  Observations:  at eval  Pt reports no pain at rest, increased pain at times on ulnar side of the hand and near .  She has difficulty with strength, ROM and lifting/holding items in left hand.  Full fisting , full opposition of thumb to digits and base of small finger with no pain.  Pain noted with ulnar deviation, supination and pronation of forearm.    TREATMENT DATE: 06/17/2023    Patient arrives with increased wrist extension as well as flexion.  See flowsheet Patient reports of the day catching herself with wrist extension and straining the back of the wrist little bit. Coming in no pain. After assessing wrist flexion extension had a 4-5/10 over the posterior wrist. Reinforced with patient to use heat prior to her flexibility exercises. Patient to focus on passive range of motion and stretches prior to attempting active to avoid straining.  Grip and prehension about the same. Fluidotherapy  Time: 8 mins  Location: left hand and wrist  To decrease stiffness and pain- increased motion  Therapeutic Exercises:    Strength assessment showed 5/5 strength in her range for radial ulnar deviation as well as wrist flexion extension pain-free Supination pronation 5 -/5 strength. Add yellow Thera-Band for resistance for supination pronation.  Patient can do 12 reps 2 sets once a day Needed verbal cueing to keep elbows in.  Encouraged patient to do heat prior to her stretches as well as soft tissue massage over the right foam roller for dorsal and volar forearm 20 reps Reviewed with home program for patient to focus after heat prolonged stretch for wrist flexion 10 reps followed by composite flexion 10 reps pain-free slight pull  Followed by prayer stretch 10 reps hold 5  seconds table slides.  20 reps pain-free  With  great progress again Patient can initiate wall push-ups in 2 weeks if pain-free.     PATIENT EDUCATION: Education details: contrast, ROM Person educated: Patient Education method: Explanation, Demonstration, and Handouts Education comprehension: verbalized understanding  GOALS: Goals reviewed with patient? Yes  SHORT TERM GOALS: Target date: 06/14/2023  Pt to demonstrate understanding of modifications to work tasks at avoid lifting greater than 10#. Baseline: Pt with limited knowledge at eval regarding modifications for work tasks. Goal status: INITIAL   LONG TERM GOALS: Target date: 07/05/2023  Pt will demonstrate improved left wrist motion in both  flexion and extension by 20 degrees to perform work related tasks with modified independence. Baseline: limited ROM in left wrist Goal status: INITIAL  2.  Pt will demonstrate ability to open jars and containers with modified independence. Baseline: difficulty at eval  Goal status: INITIAL  3.  Pt to demonstrate mopping with modified independence and pain 2/10 or less in left wrist Baseline: unable at eval Goal status: INITIAL  4.  Pt to increase strength in left wrist to be able to hold pots and pans during work related and home tasks in the kitchen. Baseline: difficulty at eval  Goal status: INITIAL  5.  Pt to demonstrate improved left hand function to be able to wash dishes with modified independence. Baseline: difficult at eval  Goal status: INITIAL    ASSESSMENT:  CLINICAL IMPRESSION: Patient is a 43 y.o. female who was seen today for occupational therapy for Closed nondisplaced fracture of styloid process of left radius, Sprain of left scapholunate ligament, left wrist pain which is affecting her ability to perform work related and home management tasks.  Pt presented at OT eval with decreased ROM, pain, strength and decreased functional use of her left wrist and hand.  Patient is on summer break.  From school.   Patient arrived with great progress in wrist flexion extension since last time.  Reinforced with patient again to do heat prior to soft tissue to the forearms and stretches.  With flexion working on open hand and then composite.  Wrist extension for prayer stretch followed by table slides to initiate that passive motion to work towards weightbearing.  In 2 weeks patient can initiate wall push-ups.  And can follow-up in a month.  She can call me if she needs to follow-up prior. She would benefit from skilled OT services to maximize safety and independence in necessary daily tasks at home, work and in the community.   PERFORMANCE DEFICITS: in functional skills including ADLs, IADLs, sensation, edema, ROM, strength, pain, fascial restrictions, flexibility, and UE functional use, and psychosocial skills including environmental adaptation, habits, and routines and behaviors.   IMPAIRMENTS: are limiting patient from ADLs, IADLs, work, and leisure.   COMORBIDITIES: may have co-morbidities  that affects occupational performance. Patient will benefit from skilled OT to address above impairments and improve overall function.  MODIFICATION OR ASSISTANCE TO COMPLETE EVALUATION: Min-Moderate modification of tasks or assist with assess necessary to complete an evaluation.  OT OCCUPATIONAL PROFILE AND HISTORY: Detailed assessment: Review of records and additional review of physical, cognitive, psychosocial history related to current functional performance.  CLINICAL DECISION MAKING: Moderate - several treatment options, min-mod task modification necessary  REHAB POTENTIAL: Good  EVALUATION COMPLEXITY: Moderate      PLAN:  OT FREQUENCY: Biweekly  OT DURATION: 6 weeks  PLANNED INTERVENTIONS: 97168 OT Re-evaluation, 97535 self care/ADL training, 16109 therapeutic exercise, 97530 therapeutic activity,  16109 neuromuscular re-education, 97140 manual therapy, 97035 ultrasound, 60454 paraffin, 97039  fluidotherapy, 97010 moist heat, 97010 cryotherapy, 97034 contrast bath, scar mobilization, passive range of motion, coping strategies training, patient/family education, and DME and/or AE instructions  RECOMMENDED OTHER SERVICES: none  CONSULTED AND AGREED WITH PLAN OF CARE: Patient  PLAN FOR NEXT SESSION:     Heloise Lobo, OTR/L,CLT 06/17/2023, 2:23 PM

## 2023-06-20 ENCOUNTER — Ambulatory Visit: Admitting: Occupational Therapy

## 2023-06-24 ENCOUNTER — Ambulatory Visit: Admitting: Occupational Therapy

## 2023-06-27 ENCOUNTER — Encounter: Admitting: Occupational Therapy

## 2023-07-01 ENCOUNTER — Ambulatory Visit: Admitting: Occupational Therapy

## 2023-07-04 ENCOUNTER — Encounter: Admitting: Occupational Therapy

## 2023-07-09 ENCOUNTER — Encounter: Admitting: Occupational Therapy

## 2023-07-11 ENCOUNTER — Encounter: Admitting: Occupational Therapy

## 2023-07-15 ENCOUNTER — Encounter: Admitting: Occupational Therapy

## 2023-07-18 ENCOUNTER — Ambulatory Visit: Attending: Surgery | Admitting: Occupational Therapy

## 2023-07-22 ENCOUNTER — Encounter: Admitting: Occupational Therapy

## 2023-07-25 ENCOUNTER — Encounter: Admitting: Occupational Therapy

## 2023-08-01 ENCOUNTER — Encounter: Admitting: Occupational Therapy

## 2023-09-17 NOTE — Progress Notes (Signed)
 Subjective:     Patient ID: Margaret Gates is a 43 y.o. female.  Chest Pain   : PRESENTS WITH RIGHT UPPER QUAD ABD PAIN. ONSET 1 WK. SOME NAUSEA DUE TO PAIN. NO INJURY. PAIN IS SHARPE WITH SOME MOVEMENT AND TO PALPATION. NO HA. NO UTI SYMPTOMS. NO HX OF STONE. 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 NO TX PTA. RECORDS REVIEWED. STILL HAS GALLBLADDER   Past Medical History:  has a past medical history of Adrenal hyperplasia (CMS/HHS-HCC), Asthma (HHS-HCC) (01/26/2021), Chickenpox, and GERD (gastroesophageal reflux disease) (01/26/2021). Past Surgical History:  has a past surgical history that includes Removal of ovarian cyst with incidental appendectomy; Cesarean section; Tubal ligation; arthroscopy hip (Right, 03/23/2015); arthroscopy hip (Right, 03/23/2015); intraoperative flouroscopy (Right, 03/23/2015); Appendectomy; arthroscopy hip (Left, 06/20/2015); arthroscopy hip (Left, 06/20/2015); intraoperative flouroscopy (Left, 06/20/2015); unlisted arthroscopy procedure (Left, 06/20/2015); esophagogastrodoudenoscopy w/biopsy (N/A, 08/29/2018);  Endoscopic right carpal tunnel release (Right, 08/31/2020);  Endoscopic left carpal tunnel release (Left, 10/26/2020); 1. Open right carpal tunnel release. 2. Subcutaneous anterior transposition of ulnar nerve, right elbow. (Right, 08/22/2021); and 1. Open left carpal tunnel release. 2. Subcutaneous anterior transposition of ulnar nerve, left elbow. (Left, 11/15/2021). Family History: family history includes ADD / ADHD in her brother; Hodgkin's lymphoma in her father; Scoliosis in her mother. Social History:  reports that she has been smoking cigarettes and cigars. She has a 7.5 pack-year smoking history. She has been exposed to tobacco smoke. She has never used smokeless tobacco. She reports that she does not drink alcohol and does not use drugs. Prior to encounter Medications:  Current Outpatient Medications on File Prior to Visit  Medication Sig Dispense  Refill  . albuterol  90 mcg/actuation inhaler     . BLACK COHOSH ORAL Take by mouth once daily    . hydrocortisone  (CORTEF ) 10 MG tablet Take 3 tablets (30 mg total) by mouth once daily 20 mg in the morning and 10 mg in the afternoon 320 tablet 4  . hydrocortisone  (CORTEF ) 10 MG tablet 20 mg in the morning and 10 mg in the afternoon 300 tablet 4  . hydrocortisone  (SOLU-CORTEF ) 100 mg injection Inject 100 mg as directed as needed (adrenal insufficiency).    . ibuprofen  (MOTRIN ) 200 MG tablet Take 400 mg by mouth as needed for Pain    . omeprazole (PRILOSEC OTC) 20 MG EC tablet Take 1 tablet (20 mg total) by mouth once daily    . SOLU-CORTEF  ACT-O-VIAL, PF, 100 mg/2 mL injection 100 mg intramuscular for adrenal crisis 2 each 2  . sterile water for injection 2.5 mLs by Other route as directed (for steroid reconstitution prior to administration.) 2.5 mL 4  . syringe with needle 3 mL 23 gauge x 1 1/2 Syrg Use 1 each as directed (for steroid administration) 1 each 12  . syringe with needle, safety 3 mL 25 gauge x 1 Syrg Use 1 each as directed (for steroid administration) 1 each 12  . traMADoL (ULTRAM) 50 mg tablet Take 1 tablet (50 mg total) by mouth 2 (two) times daily as needed for Pain 20 tablet 0  . fludrocortisone  (FLORINEF ) 0.1 mg tablet Take 1 tablet (0.1 mg total) by mouth once daily for 180 days 90 tablet 1  . soy isofla/blk cohosh/mag bark (ESTROVEN ORAL) Take 1 capsule by mouth once daily (Patient not taking: Reported on 09/17/2023)     No current facility-administered medications on file prior to visit.   Allergies: is allergic to penicillins, tizanidine, and cyclobenzaprine.  This an established patient is here today for: office visit.  Review of Systems  Cardiovascular:  Positive for chest pain.       Objective:   Physical Exam Vitals and nursing note reviewed.  Constitutional:      General: She is not in acute distress.    Appearance: She is not ill-appearing or  toxic-appearing.  HENT:     Head: Normocephalic and atraumatic.     Right Ear: Tympanic membrane normal.     Left Ear: Tympanic membrane normal.     Mouth/Throat:     Mouth: Mucous membranes are moist.  Eyes:     General: No scleral icterus. Cardiovascular:     Rate and Rhythm: Normal rate and regular rhythm.  Pulmonary:     Effort: Pulmonary effort is normal. No respiratory distress.     Breath sounds: Normal breath sounds.     Comments: NO PAIN OVER RIBS . SKIN CLEAR Abdominal:     General: Abdomen is flat. Bowel sounds are normal.     Comments: PAINT TO PALPATION RUQ . POS GUARDING  Musculoskeletal:     Cervical back: Normal range of motion and neck supple. No rigidity.  Lymphadenopathy:     Cervical: No cervical adenopathy.  Skin:    General: Skin is warm and dry.     Findings: No rash.  Neurological:     Mental Status: She is alert.    LABS AND XRAY ARE ALL GOOD. FEEL THIS IS MUSCULAR SKELETAL PAIN    Assessment:     1. Right upper quadrant abdominal pain   -     Urinalysis w/Microscopic -     CBC w/auto Differential (5 Part) -     Comprehensive Metabolic Panel (CMP) -     Lipase -     X-ray chest PA and lateral -     baclofen (LIORESAL) 10 MG tablet; 1 PO TID PRN -     traMADoL (ULTRAM) 50 mg tablet; Take 1 tablet (50 mg total) by mouth every 6 (six) hours as needed for up to 10 days     Plan:     Patient Instructions  LABS AND XRAY ALL GOOD. FEEL THIS IS MUSCULAR SKELETAL  MEDICATIONS AS DIRECTED FOLLOW UP WITH YOUR PCP IF NOT RESOLVING TO ER IF SYMPTOMS WORSEN.

## 2023-10-01 ENCOUNTER — Other Ambulatory Visit: Payer: Self-pay | Admitting: Nurse Practitioner

## 2023-10-01 ENCOUNTER — Ambulatory Visit
Admission: RE | Admit: 2023-10-01 | Discharge: 2023-10-01 | Disposition: A | Discharge status: Home / Self Care | Source: Ambulatory Visit | Attending: Nurse Practitioner | Admitting: Nurse Practitioner

## 2023-10-01 ENCOUNTER — Ambulatory Visit: Payer: Self-pay | Admitting: Nurse Practitioner

## 2023-10-01 ENCOUNTER — Encounter: Payer: Self-pay | Admitting: Nurse Practitioner

## 2023-10-01 ENCOUNTER — Ambulatory Visit: Admitting: Nurse Practitioner

## 2023-10-01 VITALS — BP 126/78 | HR 104 | Temp 98.3°F | Resp 16 | Ht 66.0 in | Wt 175.0 lb

## 2023-10-01 DIAGNOSIS — R1011 Right upper quadrant pain: Secondary | ICD-10-CM | POA: Insufficient documentation

## 2023-10-01 DIAGNOSIS — K828 Other specified diseases of gallbladder: Secondary | ICD-10-CM

## 2023-10-01 DIAGNOSIS — R5383 Other fatigue: Secondary | ICD-10-CM

## 2023-10-01 LAB — COMPREHENSIVE METABOLIC PANEL WITH GFR
AG Ratio: 2.2 (calc) (ref 1.0–2.5)
ALT: 14 U/L (ref 6–29)
AST: 9 U/L — ABNORMAL LOW (ref 10–30)
Albumin: 4.9 g/dL (ref 3.6–5.1)
Alkaline phosphatase (APISO): 62 U/L (ref 31–125)
BUN: 8 mg/dL (ref 7–25)
CO2: 28 mmol/L (ref 20–32)
Calcium: 10.2 mg/dL (ref 8.6–10.2)
Chloride: 103 mmol/L (ref 98–110)
Creat: 0.82 mg/dL (ref 0.50–0.99)
Globulin: 2.2 g/dL (ref 1.9–3.7)
Glucose, Bld: 90 mg/dL (ref 65–99)
Potassium: 4.2 mmol/L (ref 3.5–5.3)
Sodium: 139 mmol/L (ref 135–146)
Total Bilirubin: 0.6 mg/dL (ref 0.2–1.2)
Total Protein: 7.1 g/dL (ref 6.1–8.1)
eGFR: 90 mL/min/1.73m2 (ref >=60)

## 2023-10-01 LAB — CBC WITH DIFFERENTIAL/PLATELET
Absolute Lymphocytes: 3008 {cells}/uL (ref 850–3900)
Absolute Monocytes: 773 {cells}/uL (ref 200–950)
Basophils Absolute: 103 {cells}/uL (ref 0–200)
Basophils Relative: 1 %
Eosinophils Absolute: 968 {cells}/uL — ABNORMAL HIGH (ref 15–500)
Eosinophils Relative: 9.4 %
HCT: 47.2 % — ABNORMAL HIGH (ref 35.0–45.0)
Hemoglobin: 15.3 g/dL (ref 11.7–15.5)
MCH: 32.8 pg (ref 27.0–33.0)
MCHC: 32.4 g/dL (ref 32.0–36.0)
MCV: 101.3 fL — ABNORMAL HIGH (ref 80.0–100.0)
MPV: 9.7 fL (ref 7.5–12.5)
Monocytes Relative: 7.5 %
Neutro Abs: 5449 {cells}/uL (ref 1500–7800)
Neutrophils Relative %: 52.9 %
Platelets: 377 Thousand/uL (ref 140–400)
RBC: 4.66 Million/uL (ref 3.80–5.10)
RDW: 11.5 % (ref 11.0–15.0)
Total Lymphocyte: 29.2 %
WBC: 10.3 Thousand/uL (ref 3.8–10.8)

## 2023-10-01 LAB — LIPASE: Lipase: 40 U/L (ref 7–60)

## 2023-10-01 LAB — TSH: TSH: 1.43 m[IU]/L

## 2023-10-01 MED ORDER — TRAMADOL HCL 50 MG PO TABS: 50.0000 mg | ORAL_TABLET | Freq: Three times a day (TID) | ORAL | 0 refills | Status: AC | PRN

## 2023-10-01 NOTE — Progress Notes (Signed)
 BP 126/78   Pulse (!) 104   Temp 98.3 F (36.8 C)   Resp 16   Ht 5' 6 (1.676 m)   Wt 175 lb (79.4 kg)   SpO2 99%   BMI 28.25 kg/m    Subjective:    Patient ID: Margaret Gates, female    DOB: 12-17-1980, 43 y.o.   MRN: [id number]  HPI: Margaret Gates is a 43 y.o. female  Chief Complaint  Patient presents with   Follow-up    Went to walk-in for abdominal pain   Abdominal Pain    X2 weeks. RUQ, pressure from bra makes her feel sick. No vomiting, but has feeling of it. Eating worsens it.   Fatigue   Discussed the use of AI scribe software for clinical note transcription with the patient, who gave verbal consent to proceed.  History of Present Illness Margaret Gates is a 43 year old female who presents with right upper quadrant abdominal pain.  Right upper quadrant abdominal pain - Right upper quadrant abdominal pain for two weeks - Pain worsens with eating - Described as feeling like ribs are compressing the lung, causing difficulty breathing - Unable to wear a bra comfortably due to pain - Associated with nausea throughout the day - No vomiting or fever - Previous abdominal ultrasound and CT scan were normal back in 05/2023 - Past upper GI examination showed slightly pink stomach lining - Family history of gallbladder removal in aunt  Gastrointestinal symptoms - Nausea, bloating, and discomfort after eating, especially with meats - Diarrhea and irregular bowel movements - No bowel movement in the morning unless coffee is consumed  Constitutional symptoms - Fatigue with energy level described as 'zero to none' - No fever, but husband noted she felt slightly warm recently  Menopausal symptoms - Hot flashes followed by chills - No menstrual period for over a year  Adrenal mass and urinary findings - History of a previously identified mass around the adrenal gland at [facility], not confirmed elsewhere - Previous urinalysis showed trace blood - No  urinary symptoms  Access to specialty care - Has not seen a gastroenterologist recently due to issues with accessing records         05/16/2023   10:05 AM 12/10/2022   11:41 AM 09/19/2022    9:03 AM  Depression screen PHQ 2/9  Decreased Interest 0 0 0  Down, Depressed, Hopeless 0 0 0  PHQ - 2 Score 0 0 0  Altered sleeping 0  0  Tired, decreased energy 0  0  Change in appetite 0  0  Feeling bad or failure about yourself  0  0  Trouble concentrating 0  0  Moving slowly or fidgety/restless 0  0  Suicidal thoughts 0  0  PHQ-9 Score 0  0  Difficult doing work/chores Not difficult at all  Not difficult at all    Relevant past medical, surgical, family and social history reviewed and updated as indicated. Interim medical history since our last visit reviewed. Allergies and medications reviewed and updated.  Review of Systems  Ten systems reviewed and is negative except as mentioned in HPI      Objective:      BP 126/78   Pulse (!) 104   Temp 98.3 F (36.8 C)   Resp 16   Ht 5' 6 (1.676 m)   Wt 175 lb (79.4 kg)   SpO2 99%   BMI 28.25 kg/m    Wt Readings from  Last 3 Encounters:  10/01/23 175 lb (79.4 kg)  05/16/23 179 lb 12.8 oz (81.6 kg)  02/18/23 179 lb (81.2 kg)    Physical Exam GENERAL: Alert, cooperative, well developed, no acute distress. HEENT: Normocephalic, normal oropharynx, moist mucous membranes. CHEST: Clear to auscultation bilaterally. No wheezes, rhonchi, or crackles. CARDIOVASCULAR: Normal heart rate and rhythm, S1 and S2 normal without murmurs. ABDOMEN: Soft, right upper quadrant tender to palpation, non-distended, without organomegaly. Normal bowel sounds. EXTREMITIES: No cyanosis or edema. NEUROLOGICAL: Cranial nerves grossly intact, moves all extremities without gross motor or sensory deficit.  Results for orders placed or performed during the hospital encounter of 05/16/23  Lipase, blood   Collection Time: 05/16/23 10:55 AM  Result Value Ref  Range   Lipase 53 (H) 11 - 51 U/L  Comprehensive metabolic panel with GFR   Collection Time: 05/16/23 10:55 AM  Result Value Ref Range   Sodium 139 135 - 145 mmol/L   Potassium 3.6 3.5 - 5.1 mmol/L   Chloride 104 98 - 111 mmol/L   CO2 27 22 - 32 mmol/L   Glucose, Bld 94 70 - 99 mg/dL   BUN 12 6 - 20 mg/dL   Creatinine, Ser 9.32 0.44 - 1.00 mg/dL   Calcium 9.4 8.9 - 89.6 mg/dL   Total Protein 7.2 6.5 - 8.1 g/dL   Albumin 4.5 3.5 - 5.0 g/dL   AST 14 (L) 15 - 41 U/L   ALT 16 0 - 44 U/L   Alkaline Phosphatase 53 38 - 126 U/L   Total Bilirubin 1.0 0.0 - 1.2 mg/dL   GFR, Estimated >39 >39 mL/min   Anion gap 8 5 - 15  CBC with Differential/Platelet   Collection Time: 05/16/23 10:55 AM  Result Value Ref Range   WBC 8.1 4.0 - 10.5 K/uL   RBC 4.34 3.87 - 5.11 MIL/uL   Hemoglobin 14.1 12.0 - 15.0 g/dL   HCT 58.4 63.9 - 53.9 %   MCV 95.6 80.0 - 100.0 fL   MCH 32.5 26.0 - 34.0 pg   MCHC 34.0 30.0 - 36.0 g/dL   RDW 88.3 88.4 - 84.4 %   Platelets 315 150 - 400 K/uL   nRBC 0.0 0.0 - 0.2 %   Neutrophils Relative % 57 %   Neutro Abs 4.6 1.7 - 7.7 K/uL   Lymphocytes Relative 25 %   Lymphs Abs 2.0 0.7 - 4.0 K/uL   Monocytes Relative 9 %   Monocytes Absolute 0.7 0.1 - 1.0 K/uL   Eosinophils Relative 8 %   Eosinophils Absolute 0.7 (H) 0.0 - 0.5 K/uL   Basophils Relative 1 %   Basophils Absolute 0.1 0.0 - 0.1 K/uL   Immature Granulocytes 0 %   Abs Immature Granulocytes 0.02 0.00 - 0.07 K/uL          Assessment & Plan:   Problem List Items Addressed This Visit   None Visit Diagnoses       RUQ abdominal pain    -  Primary   Relevant Orders   US  ABDOMEN LIMITED RUQ (LIVER/GB)   CBC with Differential/Platelet   Comprehensive metabolic panel with GFR   Lipase     Other fatigue       Relevant Orders   TSH        Assessment and Plan Assessment & Plan Suspected gallbladder disease with right upper quadrant abdominal pain, nausea, and diarrhea Right upper quadrant abdominal  pain for two weeks, worsened by eating, with associated nausea and  diarrhea. Previous imaging including ultrasound and CT were normal, but gallbladder issues can present with normal imaging if stones are not present at the time of the scan. Symptoms are consistent with gallbladder disease, especially given the pain's location and exacerbation by eating. - Order abdominal ultrasound to evaluate gallbladder. - Refer to surgery for further evaluation of gallbladder disease. - Order laboratory tests to assess for any underlying issues.  Menopause Menopause confirmed as she has not had a menstrual period for over a year. Experiencing hot flashes and chills, which are common symptoms of menopause.  Fatigue Reports significant fatigue with low energy levels. Fatigue may be related to suspected gallbladder disease or menopause.        Follow up plan: Return if symptoms worsen or fail to improve.

## 2023-10-03 ENCOUNTER — Ambulatory Visit: Admitting: Nurse Practitioner

## 2023-10-07 ENCOUNTER — Ambulatory Visit: Admitting: Physician Assistant

## 2023-10-07 ENCOUNTER — Other Ambulatory Visit: Payer: Self-pay | Admitting: Nurse Practitioner

## 2023-10-07 ENCOUNTER — Encounter: Payer: Self-pay | Admitting: Physician Assistant

## 2023-10-07 VITALS — BP 112/68 | HR 91 | Ht 66.0 in | Wt 174.0 lb

## 2023-10-07 DIAGNOSIS — K582 Mixed irritable bowel syndrome: Secondary | ICD-10-CM | POA: Diagnosis not present

## 2023-10-07 DIAGNOSIS — K219 Gastro-esophageal reflux disease without esophagitis: Secondary | ICD-10-CM | POA: Diagnosis not present

## 2023-10-07 DIAGNOSIS — J45909 Unspecified asthma, uncomplicated: Secondary | ICD-10-CM | POA: Diagnosis not present

## 2023-10-07 DIAGNOSIS — R1011 Right upper quadrant pain: Secondary | ICD-10-CM | POA: Diagnosis not present

## 2023-10-07 DIAGNOSIS — F17290 Nicotine dependence, other tobacco product, uncomplicated: Secondary | ICD-10-CM

## 2023-10-07 MED ORDER — DICYCLOMINE HCL 10 MG PO CAPS: 10.0000 mg | ORAL_CAPSULE | Freq: Three times a day (TID) | ORAL | 2 refills | Status: DC

## 2023-10-07 NOTE — Patient Instructions (Signed)
 Please complete the HIDA scan set up by your Primary Care Provider.  We have sent the following medications to your pharmacy for you to pick up at your convenience: Dicyclomine 10 mg 3 times daily  Please follow up sooner if symptoms increase or worsen  Due to recent changes in healthcare laws, you may see the results of your imaging and laboratory studies on MyChart before your provider has had a chance to review them.  We understand that in some cases there may be results that are confusing or concerning to you. Not all laboratory results come back in the same time frame and the provider may be waiting for multiple results in order to interpret others.  Please give us  48 hours in order for your provider to thoroughly review all the results before contacting the office for clarification of your results.   Thank you for trusting me with your gastrointestinal care!   Ellouise Console, PA-C _______________________________________________________  If your blood pressure at your visit was 140/90 or greater, please contact your primary care physician to follow up on this.  _______________________________________________________  If you are age 44 or older, your body mass index should be between 23-30. Your Body mass index is 28.08 kg/m. If this is out of the aforementioned range listed, please consider follow up with your Primary Care Provider.  If you are age 40 or younger, your body mass index should be between 19-25. Your Body mass index is 28.08 kg/m. If this is out of the aformentioned range listed, please consider follow up with your Primary Care Provider.   ________________________________________________________  The [facility] providers would like to encourage you to use MYCHART to communicate with providers for non-urgent requests or questions.  Due to long hold times on the telephone, sending your provider a message by [facility] may be a faster and more efficient way to get a response.   Please allow 48 business hours for a response.  Please remember that this is for non-urgent requests.  _______________________________________________________

## 2023-10-07 NOTE — Progress Notes (Signed)
 Margaret Console, PA-C [address] [place], [place]  [address] Phone: [phone]   Gastroenterology Consultation  Referring Provider:     Gareth Mliss FALCON, FNP Primary Care Physician:  Gareth Mliss FALCON, FNP Primary Gastroenterologist:  Margaret Console, PA-C / Glendia Holt, MD  Reason for Consultation:     RUQ pain        HPI:   Margaret Gates is a 43 y.o. y/o female referred for consultation & management  by Gareth Mliss FALCON, FNP.    New patient.  Here to evaluate RUQ pain.  History of GERD.  Currently on Prilosec 20 mg twice daily.  She has had episodes of RUQ pain since at least 2020, over 5 years.  She reports flareup of RUQ pain for 3 weeks.  It is located right underneath her right rib cage.  She has had some associated right mid back pain.  Has abdominal bloating and nausea.  Denies vomiting, fever, or chills.  She has history of irritable bowel syndrome.  Has episodes of diarrhea alternating with constipation.  She denies melena, hematochezia, or weight loss.  It hurts to bend forward.  She feels food sitting in the epigastrium.  No esophageal dysphagia.  Recent chest x-ray and abdominal x-rays were unrevealing through urgent care and PCP.  10/01/2023 RUQ ultrasound: Normal liver and gallbladder.  No gallstones.  Normal CBD 3 mm.  05/2023 CT abdomen pelvis with contrast: No acute abnormality.  Mildly dilated pelvic vessels, possible pelvic congestion syndrome.  Aortic atherosclerosis.  No other abnormality.  She is scheduled to have HIDA scan 10/17/2023.  08/2018 EGD: Done at [facility].  Report not available.  Pathology report showed biopsies negative for celiac and H. pylori.  Esophageal biopsies positive for Candida esophagitis.  Normal stomach and duodenal biopsies.  Patient not denies dysphagia.  Past medical history significant for adrenal insufficiency followed by endocrinologist at [facility].  Patient requires hydrocortisone  100 mg prior to sedation for surgery or  procedures.  Patient has history of asthma, current smoker.  Does not have pulmonologist.  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    Asthma    Bronchitis    GERD (gastroesophageal reflux disease)    Headache    Hiatal hernia    IBS (irritable bowel syndrome)    Severe sepsis (HCC) 01/26/2021   Tobacco use     Past Surgical History:  Procedure Laterality Date   APPENDECTOMY     CARPAL TUNNEL RELEASE Right 08/31/2020   Procedure: CARPAL TUNNEL RELEASE ENDOSCOPIC;  Surgeon: Edie Norleen PARAS, MD;  Location: [facility] ORS;  Service: Orthopedics;  Laterality: Right;   CARPAL TUNNEL RELEASE Left 10/26/2020   Procedure: CARPAL TUNNEL RELEASE ENDOSCOPIC;  Surgeon: Edie Norleen PARAS, MD;  Location: [facility] ORS;  Service: Orthopedics;  Laterality: Left;   CARPAL TUNNEL RELEASE Right 08/22/2021   Procedure: OPEN REVISION RIGHT CARPAL TUNNEL RELEASE;  Surgeon: Edie Norleen PARAS, MD;  Location: [facility] ORS;  Service: Orthopedics;  Laterality: Right;   CARPAL TUNNEL RELEASE Left 11/15/2021   Procedure: Revision open left carpal tunnel release with subcutaneous transposition of ulnar nerve at left elbow;  Surgeon: Edie Norleen PARAS, MD;  Location: [facility] ORS;  Service: Orthopedics;  Laterality: Left;   CESAREAN SECTION     HIP SURGERY Bilateral    LAPROSCOPIC BONE SPURS   HIP SURGERY Bilateral    ULNAR NERVE TRANSPOSITION Right 08/22/2021   Procedure: SUBCUTANEOUS TRANSPOSITION OF THE  ULNAR NERVE AT RIGHT ELBOW.;  Surgeon: Edie Norleen PARAS, MD;  Location: [facility] ORS;  Service: Orthopedics;  Laterality: Right;   ULNAR NERVE TRANSPOSITION Left 11/15/2021   Procedure: ULNAR NERVE DECOMPRESSION/TRANSPOSITION;  Surgeon: Edie Norleen PARAS, MD;  Location: [facility] ORS;  Service: Orthopedics;  Laterality: Left;   UPPER GI ENDOSCOPY  2020    Prior to Admission medications   Medication Sig Start Date End Date Taking? Authorizing Provider  acetaminophen  (TYLENOL ) 500 MG tablet Take 500-1,000 mg by  mouth every 6 (six) hours as needed for moderate pain or headache.    [provider]  albuterol  (VENTOLIN  HFA) 108 (90 Base) MCG/ACT inhaler Inhale 2 puffs into the lungs every 6 (six) hours as needed for wheezing or shortness of breath. 02/13/22   Pender, Julie F, FNP  famotidine  (PEPCID ) 20 MG tablet Take 1 tablet (20 mg total) by mouth 2 (two) times daily. 05/17/23   Pender, Julie F, FNP  fludrocortisone  (FLORINEF ) 0.1mg /mL SUSP Take 0.1 mg by mouth daily.    [provider]  hydrocortisone  (CORTEF ) 10 MG tablet Take 30 mg by mouth 2 (two) times daily.    [provider]  ibuprofen  (ADVIL ) 200 MG tablet Take 400-800 mg by mouth every 8 (eight) hours as needed for headache or moderate pain.    [provider]  Multiple Vitamin (MULTIVITAMIN WITH MINERALS) TABS tablet Take 1 tablet by mouth daily.    [provider]  omeprazole (PRILOSEC OTC) 20 MG tablet Take 20 mg by mouth daily.    [provider]  ondansetron  (ZOFRAN -ODT) 4 MG disintegrating tablet Take 1 tablet (4 mg total) by mouth every 8 (eight) hours as needed. 12/10/22   Pender, Julie F, FNP  traMADol (ULTRAM) 50 MG tablet Take 1 tablet (50 mg total) by mouth every 8 (eight) hours as needed for up to 7 days. 10/01/23 10/08/23  Gareth Mliss FALCON, FNP    Family History  Problem Relation Age of Onset   Scoliosis Mother    Hodgkin's lymphoma Father    ADD / ADHD Brother    Breast cancer Paternal Grandmother 19 - 34   Breast cancer Paternal Aunt 8 - 49   Colon cancer Neg Hx    Esophageal cancer Neg Hx    Stomach cancer Neg Hx      Social History   Tobacco Use   Smoking status: Some Days    Types: Cigars   Smokeless tobacco: Never  Vaping Use   Vaping status: Some Days   Substances: Nicotine   Substance Use Topics   Alcohol use: Yes    Comment: Ocassionally   Drug use: Never    Allergies as of 10/07/2023 - Review Complete 10/07/2023  Allergen Reaction Noted   Penicillins   01/03/2017   Tizanidine Nausea Only 06/30/2013   Cyclobenzaprine Nausea Only 05/10/2017    Review of Systems:    All systems reviewed and negative except where noted in HPI.   Physical Exam:  BP 112/68   Pulse 91   Ht 5' 6 (1.676 m)   Wt 174 lb (78.9 kg)   SpO2 96%   BMI 28.08 kg/m  No LMP recorded. Patient is perimenopausal.  General:   Alert,  Well-developed, well-nourished, pleasant and cooperative in NAD Lungs:  Respirations even and unlabored.  Moderate expiratory wheezes throughout lower lung fields bilaterally.  Mild rhonchi.   No crackles.  No acute distress.  Does not use oxygen. Heart:  Regular rate and rhythm; no murmurs, clicks,  rubs, or gallops. Abdomen:  Normal bowel sounds.  No bruits.  Soft, and non-distended without masses, hepatosplenomegaly or hernias noted.  No Tenderness.  No guarding or rebound tenderness.    Neurologic:  Alert and oriented x3;  grossly normal neurologically. Psych:  Alert and cooperative. Normal mood and affect.  Imaging Studies: US  ABDOMEN LIMITED RUQ (LIVER/GB) Result Date: 10/01/2023 CLINICAL DATA:  RUQ abdominal pain EXAM: ULTRASOUND ABDOMEN LIMITED RIGHT UPPER QUADRANT COMPARISON:  05/17/2023 FINDINGS: Gallbladder: No gallstones. No wall thickening or pericholecystic fluid. No sonographic Murphy's sign noted by sonographer. Common bile duct: Diameter: 3 mm Liver: Normal echogenicity. No focal lesion identified. No intrahepatic biliary ductal dilation. Portal vein is patent on color Doppler imaging with normal direction of blood flow towards the liver. Right Kidney: Partially visualized. No mass. No hydronephrosis or nephrolithiasis. Other: None. IMPRESSION: No cholecystolithiasis or changes of acute cholecystitis. Electronically Signed   By: Rogelia Myers M.D.   On: 10/01/2023 10:48    Labs: CBC    Component Value Date/Time   WBC 10.3 10/01/2023 0843   RBC 4.66 10/01/2023 0843   HGB 15.3 10/01/2023 0843   HGB 14.7 07/22/2013 0940    HCT 47.2 (H) 10/01/2023 0843   HCT 45.1 07/22/2013 0940   PLT 377 10/01/2023 0843   PLT 253 07/22/2013 0940   MCV 101.3 (H) 10/01/2023 0843   MCV 100 07/22/2013 0940    CMP     Component Value Date/Time   NA 139 10/01/2023 0843   K 4.2 10/01/2023 0843   CL 103 10/01/2023 0843   CO2 28 10/01/2023 0843   GLUCOSE 90 10/01/2023 0843   BUN 8 10/01/2023 0843   CREATININE 0.82 10/01/2023 0843   CALCIUM 10.2 10/01/2023 0843   PROT 7.1 10/01/2023 0843   ALBUMIN 4.5 05/16/2023 1055   AST 9 (L) 10/01/2023 0843   ALT 14 10/01/2023 0843   ALKPHOS 53 05/16/2023 1055   BILITOT 0.6 10/01/2023 0843   GFRNONAA >60 05/16/2023 1055   GFRAA >60 01/03/2017 1416    Assessment and Plan:   Margaret Gates is a 43 y.o. y/o female has been referred for:  1.  Chronic RUQ pain over 5 years; multiple negative RUQ ultrasounds and CT scans.  She is currently scheduled for a HIDA scan 10/17/2023. - Continue with plan for HIDA scan as scheduled to evaluate for biliary dyskinesia. - If symptoms persist, consider Ortho referral for musculoskeletal pain.  2.  GERD: Improved on omeprazole 20 mg twice daily - Continue GERD treatment - Recommend Lifestyle Modifications to prevent Acid Reflux.  Rec. Avoid coffee, sodas, peppermint, garlic, onions, alcohol, citrus fruits, chocolate, tomatoes, fatty and spicey foods.  Avoid eating 2-3 hours before bedtime.    3.  History of esophageal candidiasis: Assymptomatic; No Dysphagia.  4.  Irritable Bowel Syndrome - Rx Dicyclomine 10mg  TID, #90, 2 RF.  5.  Asthma / Tobacco Use Disorder (Cigarettes) - She has wheezing on exam.   - Recommend she stop smoking. - Recommend f/u with Pulmonologist.  6. Adrenal Insufficiency - She is followed by [facility]ocrinologist. - She states she requires 100mg  oral Hydrocortisone  prior to sedation for procedures.  7.  Colon Cancer Screening - First Screening Colonoscopy will be due 08/2024 (next year) at age 32.  Follow up  in 6 weeks.  Margaret Console, PA-C

## 2023-10-09 ENCOUNTER — Other Ambulatory Visit: Payer: Self-pay | Admitting: Nurse Practitioner

## 2023-10-09 NOTE — Telephone Encounter (Signed)
 Requested medication (s) are due for refill today: yes  Requested medication (s) are on the active medication list: yes  Last refill:  10/01/23  Future visit scheduled: no  Notes to clinic:  Unable to refill per protocol, cannot delegate.      Requested Prescriptions  Pending Prescriptions Disp Refills   traMADol (ULTRAM) 50 MG tablet [Pharmacy Med Name: TRAMADOL HCL 50 MG TABLET] 20 tablet 0    Sig: Take 1 tablet (50 mg total) by mouth every 8 (eight) hours as needed for up to 7 days.     Not Delegated - Analgesics:  Opioid Agonists Failed - 10/09/2023  9:13 AM      Failed - This refill cannot be delegated      Failed - Urine Drug Screen completed in last 360 days      Failed - Valid encounter within last 3 months    Recent Outpatient Visits           1 week ago RUQ abdominal pain   [facility] Gareth Mliss FALCON, FNP   4 months ago Flank pain   [facility] Gareth Mliss FALCON, FNP   7 months ago Left wrist pain   [facility] Gareth Mliss FALCON, [place]

## 2023-10-10 ENCOUNTER — Other Ambulatory Visit: Payer: Self-pay

## 2023-10-11 ENCOUNTER — Other Ambulatory Visit: Payer: Self-pay

## 2023-10-11 MED ORDER — ALBUTEROL SULFATE HFA 108 (90 BASE) MCG/ACT IN AERS
2.0000 | INHALATION_SPRAY | Freq: Four times a day (QID) | RESPIRATORY_TRACT | 3 refills | Status: AC | PRN
  Filled 2023-10-11: qty 6.7, 25d supply, fill #0
  Filled 2023-12-04: qty 6.7, 25d supply, fill #1
  Filled 2023-12-06 – 2023-12-23 (×2): qty 6.7, 25d supply, fill #2

## 2023-10-11 NOTE — Progress Notes (Signed)
 Agree with the assessment and plan as outlined by Brigitte Canard, PA-C.

## 2023-10-17 ENCOUNTER — Encounter
Admission: RE | Admit: 2023-10-17 | Discharge: 2023-10-17 | Disposition: A | Discharge status: Home / Self Care | Source: Ambulatory Visit | Attending: Nurse Practitioner | Admitting: Nurse Practitioner

## 2023-10-17 DIAGNOSIS — R1011 Right upper quadrant pain: Secondary | ICD-10-CM | POA: Insufficient documentation

## 2023-10-17 MED ORDER — TECHNETIUM TC 99M MEBROFENIN IV KIT
5.1900 | PACK | Freq: Once | INTRAVENOUS | Status: AC | PRN
  Administered 2023-10-17: 5.19 via INTRAVENOUS

## 2023-10-18 NOTE — Addendum Note (Signed)
 Addended by: Lynetta Tomczak F on: 10/18/2023 07:41 AM   Modules accepted: Orders

## 2023-10-21 ENCOUNTER — Encounter: Payer: Self-pay | Admitting: Surgery

## 2023-10-21 ENCOUNTER — Ambulatory Visit (INDEPENDENT_AMBULATORY_CARE_PROVIDER_SITE_OTHER): Payer: Self-pay | Admitting: Surgery

## 2023-10-21 ENCOUNTER — Telehealth: Payer: Self-pay | Admitting: Surgery

## 2023-10-21 VITALS — BP 125/67 | HR 110 | Temp 98.7°F | Ht 66.0 in | Wt 172.4 lb

## 2023-10-21 DIAGNOSIS — K828 Other specified diseases of gallbladder: Secondary | ICD-10-CM

## 2023-10-21 NOTE — H&P (View-Only) (Signed)
 Patient ID: Margaret Gates, female   DOB: 1980-11-07, 43 y.o.   MRN: [id number]  HPI Margaret Gates is a 43 y.o. female seen in consultation at the request of Mrs. Gareth FNP.  Here first subacute PE within the right upper quadrant pain is moderate worsening after heavy meals.  Seems to radiate to the mid back.  This has been present for several years and exacerbated over the last few months.  She also reports having some right-sided chest pain within the chest wall.  No fevers no chills. She Has episodes of diarrhea alternating with constipation. She denies melena, hematochezia, or weight loss  She does have a history of IBS. Already seen GI She is an active smoker She has had a CT scan of the abdomen and pelvis as well as a right upper quad ultrasound that have personally reviewed showing no evidence of acute intra-abdominal issues no evidence of gallstones.  She subsequently had a recent HIDA scan showing evidence of biliary dyskinesia with an ejection fraction of 29%. SHe does have a history of chronic adrenal insufficiency and is on chronic steroids Prior appendectomy, c section and tubal ligation.  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    Asthma    Bronchitis    GERD (gastroesophageal reflux disease)    Headache    Hiatal hernia    IBS (irritable bowel syndrome)    Severe sepsis (HCC) 01/26/2021   Tobacco use     Past Surgical History:  Procedure Laterality Date   APPENDECTOMY     CARPAL TUNNEL RELEASE Right 08/31/2020   Procedure: CARPAL TUNNEL RELEASE ENDOSCOPIC;  Surgeon: Edie Norleen PARAS, MD;  Location: [facility];  Service: Orthopedics;  Laterality: Right;   CARPAL TUNNEL RELEASE Left 10/26/2020   Procedure: CARPAL TUNNEL RELEASE ENDOSCOPIC;  Surgeon: Edie Norleen PARAS, MD;  Location: [facility];  Service: Orthopedics;  Laterality: Left;   CARPAL TUNNEL RELEASE Right 08/22/2021   Procedure: OPEN REVISION RIGHT CARPAL  TUNNEL RELEASE;  Surgeon: Edie Norleen PARAS, MD;  Location: [facility];  Service: Orthopedics;  Laterality: Right;   CARPAL TUNNEL RELEASE Left 11/15/2021   Procedure: Revision open left carpal tunnel release with subcutaneous transposition of ulnar nerve at left elbow;  Surgeon: Edie Norleen PARAS, MD;  Location: [facility];  Service: Orthopedics;  Laterality: Left;   CESAREAN SECTION     HIP SURGERY Bilateral    LAPROSCOPIC BONE SPURS   HIP SURGERY Bilateral    ULNAR NERVE TRANSPOSITION Right 08/22/2021   Procedure: SUBCUTANEOUS TRANSPOSITION OF THE ULNAR NERVE AT RIGHT ELBOW.;  Surgeon: Edie Norleen PARAS, MD;  Location: [facility];  Service: Orthopedics;  Laterality: Right;   ULNAR NERVE TRANSPOSITION Left 11/15/2021   Procedure: ULNAR NERVE DECOMPRESSION/TRANSPOSITION;  Surgeon: Edie Norleen PARAS, MD;  Location: [facility];  Service: Orthopedics;  Laterality: Left;   UPPER GI ENDOSCOPY  2020    Family History  Problem Relation Age of Onset   Scoliosis Mother    Hodgkin's lymphoma Father    ADD / ADHD Brother    Breast cancer Paternal Grandmother 47 - 84   Breast cancer Paternal Aunt 36 - 76   Colon cancer Neg Hx    Esophageal cancer Neg Hx    Stomach cancer Neg Hx     Social History Social History   Tobacco Use   Smoking status: Some Days    Types: Cigars   Smokeless tobacco: Never  Vaping Use  Vaping status: Some Days   Substances: Nicotine   Substance Use Topics   Alcohol use: Yes    Comment: Ocassionally   Drug use: Never    Allergies  Allergen Reactions   Penicillins     Unknown reaction (CHILDHOOD) Tolerated 1st generation cephalosporin (CEFAZOLIN ) on 03/23/2015, 06/20/2015, 08/31/2020 with no documented ADRs.    Tizanidine Nausea Only   Cyclobenzaprine Nausea Only    Current Outpatient Medications  Medication Sig Dispense Refill   acetaminophen  (TYLENOL ) 500 MG tablet Take 500-1,000 mg by mouth every 6 (six) hours as needed for moderate pain or headache.     albuterol  (VENTOLIN   HFA) 108 (90 Base) MCG/ACT inhaler Inhale 2 puffs into the lungs every 6 (six) hours as needed for wheezing or shortness of breath. 6.7 g 3   dicyclomine (BENTYL) 10 MG capsule Take 1 capsule (10 mg total) by mouth 3 (three) times daily before meals. 90 capsule 2   famotidine  (PEPCID ) 20 MG tablet Take 1 tablet (20 mg total) by mouth 2 (two) times daily. (Patient taking differently: Take 20 mg by mouth at bedtime as needed.)     fludrocortisone  (FLORINEF ) 0.1mg /mL SUSP Take 0.1 mg by mouth daily.     hydrocortisone  (CORTEF ) 10 MG tablet Take 30 mg by mouth 2 (two) times daily.     ibuprofen  (ADVIL ) 200 MG tablet Take 400-800 mg by mouth every 8 (eight) hours as needed for headache or moderate pain.     Multiple Vitamin (MULTIVITAMIN WITH MINERALS) TABS tablet Take 1 tablet by mouth daily.     omeprazole (PRILOSEC OTC) 20 MG tablet Take 20 mg by mouth daily.     No current facility-administered medications for this visit.     Review of Systems Full ROS  was asked and was negative except for the information on the HPI  Physical Exam Blood pressure 125/67, pulse (!) 110, temperature 98.7 F (37.1 C), temperature source Oral, height 5' 6 (1.676 m), weight 172 lb 6.4 oz (78.2 kg), SpO2 97%. CONSTITUTIONAL: NAD. EYES: Pupils are equal, round, Sclera are non-icteric. EARS, NOSE, MOUTH AND THROAT: The oropharynx is clear. The oral mucosa is pink and moist. Hearing is intact to voice. LYMPH NODES:  Lymph nodes in the neck are normal. RESPIRATORY:  Lungs are clear. There is normal respiratory effort, with equal breath sounds bilaterally, and without pathologic use of accessory muscles. CARDIOVASCULAR: Heart is regular without murmurs, gallops, or rubs. GI: The abdomen is  soft, TTP RUQ w/o rebound or Murphy.  nondistended. There are no palpable masses. There is no hepatosplenomegaly. There are normal bowel sounds in all quadrants. GU: Rectal deferred.   MUSCULOSKELETAL: Normal muscle strength and  tone. No cyanosis or edema.   SKIN: Turgor is good and there are no pathologic skin lesions or ulcers. NEUROLOGIC: Motor and sensation is grossly normal. Cranial nerves are grossly intact. PSYCH:  Oriented to person, place and time. Affect is normal.  Data Reviewed I have personally reviewed the patient's imaging, laboratory findings and medical records.    Assessment/Plan 43 yo female w findings c/w biliary dyskinesia.  Does have a history of chronic adrenal insufficiency C and is on chronic steroids.  She will need perioperative hydrocortisone  to avoid any adrenal insufficiency crisis Options of continue w/u vs cholecystectomy were discussed with the patient in detail. Also discussed the differential diagnosis of intercostal neuralgia.  Overall not necessarily straightforward and might be a combination of multiple things.  She understands this in detail She wishes to have  cholecystectomy sooner rather than later. The risks, benefits, complications, treatment options, and expected outcomes were discussed with the patient. The possibilities of bleeding, recurrent infection, finding a normal gallbladder, perforation of viscus organs, damage to surrounding structures, bile leak, abscess formation, needing a drain placed, the need for additional procedures, reaction to medication, pulmonary aspiration,  failure to diagnose a condition, the possible need to convert to an open procedure, and creating a complication requiring transfusion or operation were discussed with the patient. The patient and/or family concurred with the proposed plan, giving informed consent.  I personally spent a total of 55 minutes in the care of the patient today including performing a medically appropriate exam/evaluation, counseling and educating, placing orders, referring and communicating with other health care professionals, documenting clinical information in the EHR, independently interpreting and reviewing images studies  and coordinating care.    Laneta Luna, MD FACS General Surgeon 10/21/2023, 2:49 PM

## 2023-10-21 NOTE — Telephone Encounter (Signed)
 Patient has been advised of Pre-Admission date/time, and Surgery date at [facility].  Surgery Date: 10/24/23 Preadmission Testing Date: 10/23/23 (phone 1p-4p)  Patient informed of the scheduling process and surgery information given at time of office visit.   Patient has been made aware to call [phone], between 1-3:00pm the day before surgery, to find out what time to arrive for surgery.

## 2023-10-21 NOTE — Progress Notes (Signed)
 Patient ID: Margaret Gates, female   DOB: 1980-11-07, 43 y.o.   MRN: [id number]  HPI Margaret Gates is a 43 y.o. female seen in consultation at the request of Mrs. Gareth FNP.  Here first subacute PE within the right upper quadrant pain is moderate worsening after heavy meals.  Seems to radiate to the mid back.  This has been present for several years and exacerbated over the last few months.  She also reports having some right-sided chest pain within the chest wall.  No fevers no chills. She Has episodes of diarrhea alternating with constipation. She denies melena, hematochezia, or weight loss  She does have a history of IBS. Already seen GI She is an active smoker She has had a CT scan of the abdomen and pelvis as well as a right upper quad ultrasound that have personally reviewed showing no evidence of acute intra-abdominal issues no evidence of gallstones.  She subsequently had a recent HIDA scan showing evidence of biliary dyskinesia with an ejection fraction of 29%. SHe does have a history of chronic adrenal insufficiency and is on chronic steroids Prior appendectomy, c section and tubal ligation.  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    Asthma    Bronchitis    GERD (gastroesophageal reflux disease)    Headache    Hiatal hernia    IBS (irritable bowel syndrome)    Severe sepsis (HCC) 01/26/2021   Tobacco use     Past Surgical History:  Procedure Laterality Date   APPENDECTOMY     CARPAL TUNNEL RELEASE Right 08/31/2020   Procedure: CARPAL TUNNEL RELEASE ENDOSCOPIC;  Surgeon: Edie Norleen PARAS, MD;  Location: [facility];  Service: Orthopedics;  Laterality: Right;   CARPAL TUNNEL RELEASE Left 10/26/2020   Procedure: CARPAL TUNNEL RELEASE ENDOSCOPIC;  Surgeon: Edie Norleen PARAS, MD;  Location: [facility];  Service: Orthopedics;  Laterality: Left;   CARPAL TUNNEL RELEASE Right 08/22/2021   Procedure: OPEN REVISION RIGHT CARPAL  TUNNEL RELEASE;  Surgeon: Edie Norleen PARAS, MD;  Location: [facility];  Service: Orthopedics;  Laterality: Right;   CARPAL TUNNEL RELEASE Left 11/15/2021   Procedure: Revision open left carpal tunnel release with subcutaneous transposition of ulnar nerve at left elbow;  Surgeon: Edie Norleen PARAS, MD;  Location: [facility];  Service: Orthopedics;  Laterality: Left;   CESAREAN SECTION     HIP SURGERY Bilateral    LAPROSCOPIC BONE SPURS   HIP SURGERY Bilateral    ULNAR NERVE TRANSPOSITION Right 08/22/2021   Procedure: SUBCUTANEOUS TRANSPOSITION OF THE ULNAR NERVE AT RIGHT ELBOW.;  Surgeon: Edie Norleen PARAS, MD;  Location: [facility];  Service: Orthopedics;  Laterality: Right;   ULNAR NERVE TRANSPOSITION Left 11/15/2021   Procedure: ULNAR NERVE DECOMPRESSION/TRANSPOSITION;  Surgeon: Edie Norleen PARAS, MD;  Location: [facility];  Service: Orthopedics;  Laterality: Left;   UPPER GI ENDOSCOPY  2020    Family History  Problem Relation Age of Onset   Scoliosis Mother    Hodgkin's lymphoma Father    ADD / ADHD Brother    Breast cancer Paternal Grandmother 47 - 84   Breast cancer Paternal Aunt 36 - 76   Colon cancer Neg Hx    Esophageal cancer Neg Hx    Stomach cancer Neg Hx     Social History Social History   Tobacco Use   Smoking status: Some Days    Types: Cigars   Smokeless tobacco: Never  Vaping Use  Vaping status: Some Days   Substances: Nicotine   Substance Use Topics   Alcohol use: Yes    Comment: Ocassionally   Drug use: Never    Allergies  Allergen Reactions   Penicillins     Unknown reaction (CHILDHOOD) Tolerated 1st generation cephalosporin (CEFAZOLIN ) on 03/23/2015, 06/20/2015, 08/31/2020 with no documented ADRs.    Tizanidine Nausea Only   Cyclobenzaprine Nausea Only    Current Outpatient Medications  Medication Sig Dispense Refill   acetaminophen  (TYLENOL ) 500 MG tablet Take 500-1,000 mg by mouth every 6 (six) hours as needed for moderate pain or headache.     albuterol  (VENTOLIN   HFA) 108 (90 Base) MCG/ACT inhaler Inhale 2 puffs into the lungs every 6 (six) hours as needed for wheezing or shortness of breath. 6.7 g 3   dicyclomine (BENTYL) 10 MG capsule Take 1 capsule (10 mg total) by mouth 3 (three) times daily before meals. 90 capsule 2   famotidine  (PEPCID ) 20 MG tablet Take 1 tablet (20 mg total) by mouth 2 (two) times daily. (Patient taking differently: Take 20 mg by mouth at bedtime as needed.)     fludrocortisone  (FLORINEF ) 0.1mg /mL SUSP Take 0.1 mg by mouth daily.     hydrocortisone  (CORTEF ) 10 MG tablet Take 30 mg by mouth 2 (two) times daily.     ibuprofen  (ADVIL ) 200 MG tablet Take 400-800 mg by mouth every 8 (eight) hours as needed for headache or moderate pain.     Multiple Vitamin (MULTIVITAMIN WITH MINERALS) TABS tablet Take 1 tablet by mouth daily.     omeprazole (PRILOSEC OTC) 20 MG tablet Take 20 mg by mouth daily.     No current facility-administered medications for this visit.     Review of Systems Full ROS  was asked and was negative except for the information on the HPI  Physical Exam Blood pressure 125/67, pulse (!) 110, temperature 98.7 F (37.1 C), temperature source Oral, height 5' 6 (1.676 m), weight 172 lb 6.4 oz (78.2 kg), SpO2 97%. CONSTITUTIONAL: NAD. EYES: Pupils are equal, round, Sclera are non-icteric. EARS, NOSE, MOUTH AND THROAT: The oropharynx is clear. The oral mucosa is pink and moist. Hearing is intact to voice. LYMPH NODES:  Lymph nodes in the neck are normal. RESPIRATORY:  Lungs are clear. There is normal respiratory effort, with equal breath sounds bilaterally, and without pathologic use of accessory muscles. CARDIOVASCULAR: Heart is regular without murmurs, gallops, or rubs. GI: The abdomen is  soft, TTP RUQ w/o rebound or Murphy.  nondistended. There are no palpable masses. There is no hepatosplenomegaly. There are normal bowel sounds in all quadrants. GU: Rectal deferred.   MUSCULOSKELETAL: Normal muscle strength and  tone. No cyanosis or edema.   SKIN: Turgor is good and there are no pathologic skin lesions or ulcers. NEUROLOGIC: Motor and sensation is grossly normal. Cranial nerves are grossly intact. PSYCH:  Oriented to person, place and time. Affect is normal.  Data Reviewed I have personally reviewed the patient's imaging, laboratory findings and medical records.    Assessment/Plan 43 yo female w findings c/w biliary dyskinesia.  Does have a history of chronic adrenal insufficiency C and is on chronic steroids.  She will need perioperative hydrocortisone  to avoid any adrenal insufficiency crisis Options of continue w/u vs cholecystectomy were discussed with the patient in detail. Also discussed the differential diagnosis of intercostal neuralgia.  Overall not necessarily straightforward and might be a combination of multiple things.  She understands this in detail She wishes to have  cholecystectomy sooner rather than later. The risks, benefits, complications, treatment options, and expected outcomes were discussed with the patient. The possibilities of bleeding, recurrent infection, finding a normal gallbladder, perforation of viscus organs, damage to surrounding structures, bile leak, abscess formation, needing a drain placed, the need for additional procedures, reaction to medication, pulmonary aspiration,  failure to diagnose a condition, the possible need to convert to an open procedure, and creating a complication requiring transfusion or operation were discussed with the patient. The patient and/or family concurred with the proposed plan, giving informed consent.  I personally spent a total of 55 minutes in the care of the patient today including performing a medically appropriate exam/evaluation, counseling and educating, placing orders, referring and communicating with other health care professionals, documenting clinical information in the EHR, independently interpreting and reviewing images studies  and coordinating care.    Laneta Luna, MD FACS General Surgeon 10/21/2023, 2:49 PM

## 2023-10-21 NOTE — Patient Instructions (Signed)
 You have requested to have your gallbladder removed. This will be done at [facility] with Dr. Everlene Farrier.  If you are on any injectable weight loss medication, you will need to stop taking your GLP-1 injectable (weight loss) medications 8 days before your surgery to avoid any complications with anesthesia.   You will most likely be out of work 1-2 weeks for this surgery.  If you have FMLA or disability paperwork that needs filled out you may drop this off at our office or this can be faxed to [phone].  You will return after your post-op appointment with a lifting restriction for approximately 4 more weeks.  You will be able to eat anything you would like to following surgery. But, start by eating a bland diet and advance this as tolerated. The Gallbladder diet is below, please go as closely by this diet as possible prior to surgery to avoid any further attacks.  Please see the (blue)pre-care form that you have been given today. Our surgery scheduler will call you to verify surgery date and to go over information.   If you have any questions, please call our office.  Laparoscopic Cholecystectomy Laparoscopic cholecystectomy is surgery to remove the gallbladder. The gallbladder is located in the upper right part of the abdomen, behind the liver. It is a storage sac for bile, which is produced in the liver. Bile aids in the digestion and absorption of fats. Cholecystectomy is often done for inflammation of the gallbladder (cholecystitis). This condition is usually caused by a buildup of gallstones (cholelithiasis) in the gallbladder. Gallstones can block the flow of bile, and that can result in inflammation and pain. In severe cases, emergency surgery may be required. If emergency surgery is not required, you will have time to prepare for the procedure. Laparoscopic surgery is an alternative to open surgery. Laparoscopic surgery has a shorter recovery time. Your common bile duct may also need  to be examined during the procedure. If stones are found in the common bile duct, they may be removed. LET [facility] CARE PROVIDER KNOW ABOUT: Any allergies you have. All medicines you are taking, including vitamins, herbs, eye drops, creams, and over-the-counter medicines. Previous problems you or members of your family have had with the use of anesthetics. Any blood disorders you have. Previous surgeries you have had.  Any medical conditions you have. RISKS AND COMPLICATIONS Generally, this is a safe procedure. However, problems may occur, including: Infection. Bleeding. Allergic reactions to medicines. Damage to other structures or organs. A stone remaining in the common bile duct. A bile leak from the cyst duct that is clipped when your gallbladder is removed. The need to convert to open surgery, which requires a larger incision in the abdomen. This may be necessary if your surgeon thinks that it is not safe to continue with a laparoscopic procedure. BEFORE THE PROCEDURE Ask your health care provider about: Changing or stopping your regular medicines. This is especially important if you are taking diabetes medicines or blood thinners. Taking medicines such as aspirin and ibuprofen. These medicines can thin your blood. Do not take these medicines before your procedure if your health care provider instructs you not to. Follow instructions from your health care provider about eating or drinking restrictions. Let your health care provider know if you develop a cold or an infection before surgery. Plan to have someone take you home after the procedure. Ask your health care provider how your surgical site will be marked or identified. You may  be given antibiotic medicine to help prevent infection. PROCEDURE To reduce your risk of infection: Your health care team will wash or sanitize their hands. Your skin will be washed with soap. An IV tube may be inserted into one of your  veins. You will be given a medicine to make you fall asleep (general anesthetic). A breathing tube will be placed in your mouth. The surgeon will make several small cuts (incisions) in your abdomen. A thin, lighted tube (laparoscope) that has a tiny camera on the end will be inserted through one of the small incisions. The camera on the laparoscope will send a picture to a TV screen (monitor) in the operating room. This will give the surgeon a good view inside your abdomen. A gas will be pumped into your abdomen. This will expand your abdomen to give the surgeon more room to perform the surgery. Other tools that are needed for the procedure will be inserted through the other incisions. The gallbladder will be removed through one of the incisions. After your gallbladder has been removed, the incisions will be closed with stitches (sutures), staples, or skin glue. Your incisions may be covered with a bandage (dressing). The procedure may vary among health care providers and hospitals. AFTER THE PROCEDURE Your blood pressure, heart rate, breathing rate, and blood oxygen level will be monitored often until the medicines you were given have worn off. You will be given medicines as needed to control your pain.   This information is not intended to replace advice given to you by your health care provider. Make sure you discuss any questions you have with your health care provider.   Document Released: 12/18/2004 Document Revised: 09/08/2014 Document Reviewed: 07/30/2012 Elsevier Interactive Patient Education 2016 [employer] Inc[employer]   Low-Fat Diet for Gallbladder Conditions A low-fat diet can be helpful if you have pancreatitis or a gallbladder condition. With these conditions, your pancreas and gallbladder have trouble digesting fats. A healthy eating plan with less fat will help rest your pancreas and gallbladder and reduce your symptoms. WHAT DO I NEED TO KNOW ABOUT THIS DIET? Eat a low-fat  diet. Reduce your fat intake to less than 20-30% of your total daily calories. This is less than 50-60 g of fat per day. Remember that you need some fat in your diet. Ask your dietician what your daily goal should be. Choose nonfat and low-fat healthy foods. Look for the words "nonfat," "low fat," or "fat free." As a guide, look on the label and choose foods with less than 3 g of fat per serving. Eat only one serving. Avoid alcohol. Do not smoke. If you need help quitting, talk with your health care provider. Eat small frequent meals instead of three large heavy meals. WHAT FOODS CAN I EAT? Grains Include healthy grains and starches such as potatoes, wheat bread, fiber-rich cereal, and brown rice. Choose whole grain options whenever possible. In adults, whole grains should account for 45-65% of your daily calories.  Fruits and Vegetables Eat plenty of fruits and vegetables. Fresh fruits and vegetables add fiber to your diet. Meats and Other Protein Sources Eat lean meat such as chicken and pork. Trim any fat off of meat before cooking it. Eggs, fish, and beans are other sources of protein. In adults, these foods should account for 10-35% of your daily calories. Dairy Choose low-fat milk and dairy options. Dairy includes fat and protein, as well as calcium.  Fats and Oils Limit high-fat foods such as fried foods, sweets, baked  goods, sugary drinks.  Other Creamy sauces and condiments, such as mayonnaise, can add extra fat. Think about whether or not you need to use them, or use smaller amounts or low fat options. WHAT FOODS ARE NOT RECOMMENDED? High fat foods, such as: [employer]. Ice cream. [place] toast. Sweet rolls. Pizza. Cheese bread. Foods covered with batter, butter, creamy sauces, or cheese. Fried foods. Sugary drinks and desserts. Foods that cause gas or bloating   This information is not intended to replace advice given to you by your health care provider. Make sure you  discuss any questions you have with your health care provider.   Document Released: 12/23/2012 Document Reviewed: 12/23/2012 Elsevier Interactive Patient Education [employer]

## 2023-10-23 ENCOUNTER — Other Ambulatory Visit: Payer: Self-pay

## 2023-10-23 ENCOUNTER — Encounter
Admission: RE | Admit: 2023-10-23 | Discharge: 2023-10-23 | Disposition: A | Discharge status: Home / Self Care | Source: Ambulatory Visit | Attending: Surgery | Admitting: Surgery

## 2023-10-23 DIAGNOSIS — Z01812 Encounter for preprocedural laboratory examination: Secondary | ICD-10-CM

## 2023-10-23 HISTORY — DX: Benign neoplasm of right adrenal gland: D35.01

## 2023-10-23 HISTORY — DX: Other disorders of pituitary gland: E23.6

## 2023-10-23 HISTORY — DX: Pneumonia, unspecified organism: J18.9

## 2023-10-23 HISTORY — DX: Adrenogenital disorder, unspecified: E25.9

## 2023-10-23 HISTORY — DX: Other complications of anesthesia, initial encounter: T88.59XA

## 2023-10-23 NOTE — Patient Instructions (Addendum)
 Your procedure is scheduled on: 10/24/23 - Thursday Report to the Registration Desk on the 1st floor of the [facility]. To find out your arrival time, please call [phone] between 1PM - 3PM on: 10/23/23 - Wednesday If your arrival time is 6:00 am, do not arrive before that time as the [facility] entrance doors do not open until 6:00 am.  REMEMBER: Instructions that are not followed completely may result in serious medical risk, up to and including death; or upon the discretion of your surgeon and anesthesiologist your surgery may need to be rescheduled.  Do not eat food after midnight the night before surgery.  No gum chewing or hard candies.  You may however, drink CLEAR liquids up to 2 hours before you are scheduled to arrive for your surgery. Do not drink anything within 2 hours of your scheduled arrival time.  Clear liquids include: - water  - apple juice without pulp - gatorade (not RED colors) - black coffee or tea (Do NOT add milk or creamers to the coffee or tea) Do NOT drink anything that is not on this list.  One week prior to surgery: Stop Anti-inflammatories (NSAIDS) such as Advil , Aleve , Ibuprofen , Motrin , Naproxen , Naprosyn  and Aspirin based products such as Excedrin, Goody's Powder, BC Powder. You may take Tylenol  if needed for pain up until the day of surgery.   Stop ANY OVER THE COUNTER supplements until after surgery.  ON THE DAY OF SURGERY ONLY TAKE THESE MEDICATIONS WITH SIPS OF WATER:  fludrocortisone  (FLORINEF )  hydrocortisone  (CORTEF )   Use inhaler albuterol  (VENTOLIN  HFA) on the day of surgery and bring to the hospital.   No Alcohol for 24 hours before or after surgery.  No Smoking including e-cigarettes for 24 hours before surgery.  No chewable tobacco products for at least 6 hours before surgery.  No nicotine  patches on the day of surgery.  Do not use any recreational drugs for at least a week (preferably 2 weeks) before your surgery.   Please be advised that the combination of cocaine and anesthesia may have negative outcomes, up to and including death. If you test positive for cocaine, your surgery will be cancelled.  On the morning of surgery brush your teeth with toothpaste and water, you may rinse your mouth with mouthwash if you wish. Do not swallow any toothpaste or mouthwash.  Do not wear jewelry, make-up, hairpins, clips or nail polish.  For welded (permanent) jewelry: bracelets, anklets, waist bands, etc.  Please have this removed prior to surgery.  If it is not removed, there is a chance that hospital personnel will need to cut it off on the day of surgery.  Do not wear lotions, powders, or perfumes.   Do not shave body hair from the neck down 48 hours before surgery.  Contact lenses, hearing aids and dentures may not be worn into surgery.  Do not bring valuables to the hospital. [facility] is not responsible for any missing/lost belongings or valuables.   Notify your doctor if there is any change in your medical condition (cold, fever, infection).  Wear comfortable clothing (specific to your surgery type) to the hospital.  After surgery, you can help prevent lung complications by doing breathing exercises.  Take deep breaths and cough every 1-2 hours. Your doctor may order a device called an Incentive Spirometer to help you take deep breaths.  When coughing or sneezing, hold a pillow firmly against your incision with both hands. This is called "splinting." Doing this  helps protect your incision. It also decreases belly discomfort.  If you are being admitted to the hospital overnight, leave your suitcase in the car. After surgery it may be brought to your room.  In case of increased patient census, it may be necessary for you, the patient, to continue your postoperative care in the Same Day Surgery department.  If you are being discharged the day of surgery, you will not be allowed to drive home. You  will need a responsible individual to drive you home and stay with you for 24 hours after surgery.   If you are taking public transportation, you will need to have a responsible individual with you.  Please call the Pre-admissions Testing Dept. at [phone] if you have any questions about these instructions.  Surgery Visitation Policy:  Patients having surgery or a procedure may have two visitors.  Children under the age of 40 must have an adult with them who is not the patient.  Inpatient Visitation:    Visiting hours are 7 a.m. to 8 p.m. Up to four visitors are allowed at one time in a patient room. The visitors may rotate out with other people during the day.  One visitor age 63 or older may stay with the patient overnight and must be in the room by 8 p.m.   Merchandiser, retail to address health-related social needs:  [email or web address]

## 2023-10-24 ENCOUNTER — Other Ambulatory Visit: Payer: Self-pay

## 2023-10-24 ENCOUNTER — Ambulatory Visit
Admission: RE | Admit: 2023-10-24 | Discharge: 2023-10-24 | Disposition: A | Discharge status: Home / Self Care | Attending: Surgery | Admitting: Surgery

## 2023-10-24 ENCOUNTER — Ambulatory Visit: Admitting: Certified Registered"

## 2023-10-24 ENCOUNTER — Encounter
Admission: RE | Disposition: A | Discharge status: Home / Self Care | Payer: Self-pay | Source: Home / Self Care | Attending: Surgery

## 2023-10-24 ENCOUNTER — Ambulatory Visit: Payer: Self-pay | Admitting: Urgent Care

## 2023-10-24 ENCOUNTER — Encounter: Payer: Self-pay | Admitting: Surgery

## 2023-10-24 DIAGNOSIS — Z9049 Acquired absence of other specified parts of digestive tract: Secondary | ICD-10-CM | POA: Diagnosis not present

## 2023-10-24 DIAGNOSIS — Z7952 Long term (current) use of systemic steroids: Secondary | ICD-10-CM | POA: Diagnosis not present

## 2023-10-24 DIAGNOSIS — E2740 Unspecified adrenocortical insufficiency: Secondary | ICD-10-CM | POA: Diagnosis not present

## 2023-10-24 DIAGNOSIS — K828 Other specified diseases of gallbladder: Secondary | ICD-10-CM | POA: Insufficient documentation

## 2023-10-24 DIAGNOSIS — Z01812 Encounter for preprocedural laboratory examination: Secondary | ICD-10-CM

## 2023-10-24 DIAGNOSIS — K811 Chronic cholecystitis: Secondary | ICD-10-CM | POA: Diagnosis not present

## 2023-10-24 DIAGNOSIS — K580 Irritable bowel syndrome with diarrhea: Secondary | ICD-10-CM | POA: Diagnosis not present

## 2023-10-24 HISTORY — PX: INDOCYANINE GREEN FLUORESCENCE IMAGING (ICG): SHX7595

## 2023-10-24 LAB — POCT PREGNANCY, URINE: Preg Test, Ur: NEGATIVE

## 2023-10-24 SURGERY — CHOLECYSTECTOMY, ROBOT-ASSISTED, LAPAROSCOPIC
Anesthesia: General

## 2023-10-24 MED ORDER — FENTANYL CITRATE (PF) 100 MCG/2ML IJ SOLN
25.0000 ug | INTRAMUSCULAR | Status: DC | PRN
  Administered 2023-10-24: 50 ug via INTRAVENOUS
  Administered 2023-10-24 (×2): 25 ug via INTRAVENOUS
  Administered 2023-10-24: 50 ug via INTRAVENOUS

## 2023-10-24 MED ORDER — GLYCOPYRROLATE 0.2 MG/ML IJ SOLN
INTRAMUSCULAR | Status: AC
  Filled 2023-10-24: qty 1

## 2023-10-24 MED ORDER — CELECOXIB 200 MG PO CAPS
200.0000 mg | ORAL_CAPSULE | ORAL | Status: AC
  Administered 2023-10-24: 200 mg via ORAL

## 2023-10-24 MED ORDER — PROPOFOL 10 MG/ML IV BOLUS
INTRAVENOUS | Status: DC | PRN
  Administered 2023-10-24: 150 mg via INTRAVENOUS

## 2023-10-24 MED ORDER — CHLORHEXIDINE GLUCONATE CLOTH 2 % EX PADS: 6.0000 | MEDICATED_PAD | Freq: Once | CUTANEOUS | Status: DC

## 2023-10-24 MED ORDER — HYDROCORTISONE SOD SUC (PF) 100 MG IJ SOLR
INTRAMUSCULAR | Status: DC | PRN
  Administered 2023-10-24: 100 mg via INTRAVENOUS

## 2023-10-24 MED ORDER — FAMOTIDINE 20 MG PO TABS
ORAL_TABLET | ORAL | Status: AC
  Filled 2023-10-24: qty 1

## 2023-10-24 MED ORDER — ROCURONIUM BROMIDE 100 MG/10ML IV SOLN
INTRAVENOUS | Status: DC | PRN
  Administered 2023-10-24: 60 mg via INTRAVENOUS

## 2023-10-24 MED ORDER — BUPIVACAINE-EPINEPHRINE (PF) 0.25% -1:200000 IJ SOLN
INTRAMUSCULAR | Status: AC
  Filled 2023-10-24: qty 30

## 2023-10-24 MED ORDER — SUGAMMADEX SODIUM 200 MG/2ML IV SOLN
INTRAVENOUS | Status: DC | PRN
Start: 2023-10-24 — End: 2023-10-24
  Administered 2023-10-24: 140 mg via INTRAVENOUS
  Administered 2023-10-24: 160 mg via INTRAVENOUS

## 2023-10-24 MED ORDER — PROPOFOL 10 MG/ML IV BOLUS
INTRAVENOUS | Status: AC
Start: 2023-10-24 — End: 2023-10-24
  Filled 2023-10-24: qty 20

## 2023-10-24 MED ORDER — CEFAZOLIN SODIUM-DEXTROSE 2-4 GM/100ML-% IV SOLN
2.0000 g | INTRAVENOUS | Status: AC
  Administered 2023-10-24: 2 g via INTRAVENOUS

## 2023-10-24 MED ORDER — INDOCYANINE GREEN 25 MG IV SOLR
1.2500 mg | Freq: Once | INTRAVENOUS | Status: AC
  Administered 2023-10-24: 1.25 mg via INTRAVENOUS

## 2023-10-24 MED ORDER — ACETAMINOPHEN 10 MG/ML IV SOLN: 1000.0000 mg | Freq: Once | INTRAVENOUS | Status: DC | PRN

## 2023-10-24 MED ORDER — ONDANSETRON HCL 4 MG/2ML IJ SOLN
INTRAMUSCULAR | Status: DC | PRN
  Administered 2023-10-24 (×2): 4 mg via INTRAVENOUS

## 2023-10-24 MED ORDER — HYDROCODONE-ACETAMINOPHEN 5-325 MG PO TABS: 1.0000 | ORAL_TABLET | ORAL | 0 refills | Status: DC | PRN

## 2023-10-24 MED ORDER — GABAPENTIN 300 MG PO CAPS
300.0000 mg | ORAL_CAPSULE | ORAL | Status: AC
  Administered 2023-10-24: 300 mg via ORAL

## 2023-10-24 MED ORDER — GABAPENTIN 300 MG PO CAPS
ORAL_CAPSULE | ORAL | Status: AC
Start: 2023-10-24 — End: 2023-10-24
  Filled 2023-10-24: qty 1

## 2023-10-24 MED ORDER — FENTANYL CITRATE (PF) 100 MCG/2ML IJ SOLN
INTRAMUSCULAR | Status: AC
  Filled 2023-10-24: qty 2

## 2023-10-24 MED ORDER — ACETAMINOPHEN 500 MG PO TABS
ORAL_TABLET | ORAL | Status: AC
Start: 2023-10-24 — End: 2023-10-24
  Filled 2023-10-24: qty 2

## 2023-10-24 MED ORDER — CELECOXIB 200 MG PO CAPS
ORAL_CAPSULE | ORAL | Status: AC
Start: 2023-10-24 — End: 2023-10-24
  Filled 2023-10-24: qty 1

## 2023-10-24 MED ORDER — DROPERIDOL 2.5 MG/ML IJ SOLN: 0.6250 mg | Freq: Once | INTRAMUSCULAR | Status: DC | PRN

## 2023-10-24 MED ORDER — CEFAZOLIN SODIUM-DEXTROSE 2-4 GM/100ML-% IV SOLN
INTRAVENOUS | Status: AC
Start: 2023-10-24 — End: 2023-10-24
  Filled 2023-10-24: qty 100

## 2023-10-24 MED ORDER — ROCURONIUM BROMIDE 10 MG/ML (PF) SYRINGE
PREFILLED_SYRINGE | INTRAVENOUS | Status: AC
  Filled 2023-10-24: qty 10

## 2023-10-24 MED ORDER — CHLORHEXIDINE GLUCONATE 0.12 % MT SOLN
15.0000 mL | Freq: Once | OROMUCOSAL | Status: AC
  Administered 2023-10-24: 15 mL via OROMUCOSAL

## 2023-10-24 MED ORDER — FAMOTIDINE 20 MG PO TABS
20.0000 mg | ORAL_TABLET | Freq: Once | ORAL | Status: AC
  Administered 2023-10-24: 20 mg via ORAL

## 2023-10-24 MED ORDER — FENTANYL CITRATE (PF) 100 MCG/2ML IJ SOLN
INTRAMUSCULAR | Status: DC | PRN
  Administered 2023-10-24: 100 ug via INTRAVENOUS

## 2023-10-24 MED ORDER — ONDANSETRON HCL 4 MG/2ML IJ SOLN
INTRAMUSCULAR | Status: AC
  Filled 2023-10-24: qty 2

## 2023-10-24 MED ORDER — CHLORHEXIDINE GLUCONATE 0.12 % MT SOLN
OROMUCOSAL | Status: AC
Start: 2023-10-24 — End: 2023-10-24
  Filled 2023-10-24: qty 15

## 2023-10-24 MED ORDER — LIDOCAINE HCL (CARDIAC) PF 100 MG/5ML IV SOSY
PREFILLED_SYRINGE | INTRAVENOUS | Status: DC | PRN
  Administered 2023-10-24: 100 mg via INTRAVENOUS

## 2023-10-24 MED ORDER — CHLORHEXIDINE GLUCONATE CLOTH 2 % EX PADS
6.0000 | MEDICATED_PAD | Freq: Once | CUTANEOUS | Status: AC
  Administered 2023-10-24: 6 via TOPICAL

## 2023-10-24 MED ORDER — OXYCODONE HCL 5 MG PO TABS
5.0000 mg | ORAL_TABLET | Freq: Once | ORAL | Status: AC | PRN
  Administered 2023-10-24: 5 mg via ORAL

## 2023-10-24 MED ORDER — GLYCOPYRROLATE 0.2 MG/ML IJ SOLN
INTRAMUSCULAR | Status: DC | PRN
  Administered 2023-10-24: .1 mg via INTRAVENOUS

## 2023-10-24 MED ORDER — BUPIVACAINE-EPINEPHRINE (PF) 0.25% -1:200000 IJ SOLN
INTRAMUSCULAR | Status: DC | PRN
  Administered 2023-10-24: 30 mL

## 2023-10-24 MED ORDER — DEXMEDETOMIDINE HCL IN NACL 80 MCG/20ML IV SOLN
INTRAVENOUS | Status: DC | PRN
  Administered 2023-10-24: 8 ug via INTRAVENOUS

## 2023-10-24 MED ORDER — DEXMEDETOMIDINE HCL IN NACL 80 MCG/20ML IV SOLN
INTRAVENOUS | Status: AC
Start: 2023-10-24 — End: 2023-10-24
  Filled 2023-10-24: qty 20

## 2023-10-24 MED ORDER — LACTATED RINGERS IV SOLN
INTRAVENOUS | Status: DC
Start: 2023-10-24 — End: 2023-10-24

## 2023-10-24 MED ORDER — ORAL CARE MOUTH RINSE: 15.0000 mL | Freq: Once | OROMUCOSAL | Status: AC

## 2023-10-24 MED ORDER — ACETAMINOPHEN 500 MG PO TABS
1000.0000 mg | ORAL_TABLET | ORAL | Status: AC
  Administered 2023-10-24: 1000 mg via ORAL

## 2023-10-24 MED ORDER — OXYCODONE HCL 5 MG PO TABS
ORAL_TABLET | ORAL | Status: AC
  Filled 2023-10-24: qty 1

## 2023-10-24 MED ORDER — MIDAZOLAM HCL 2 MG/2ML IJ SOLN
INTRAMUSCULAR | Status: AC
  Filled 2023-10-24: qty 2

## 2023-10-24 MED ORDER — MIDAZOLAM HCL (PF) 2 MG/2ML IJ SOLN
INTRAMUSCULAR | Status: DC | PRN
  Administered 2023-10-24: 2 mg via INTRAVENOUS

## 2023-10-24 MED ORDER — LIDOCAINE HCL (PF) 2 % IJ SOLN
INTRAMUSCULAR | Status: AC
  Filled 2023-10-24: qty 5

## 2023-10-24 MED ORDER — OXYCODONE HCL 5 MG/5ML PO SOLN: 5.0000 mg | Freq: Once | ORAL | Status: AC | PRN

## 2023-10-24 SURGICAL SUPPLY — 36 items
CANNULA REDUCER 12-8 DVNC XI (CANNULA) IMPLANT
CAUTERY HOOK MNPLR 1.6 DVNC XI (INSTRUMENTS) ×2 IMPLANT
CLIP LIGATING HEMO O LOK GREEN (MISCELLANEOUS) ×2 IMPLANT
DEFOGGER SCOPE WARM SEASHARP (MISCELLANEOUS) ×2 IMPLANT
DERMABOND ADVANCED .7 DNX12 (GAUZE/BANDAGES/DRESSINGS) ×2 IMPLANT
DRAPE ARM DVNC X/XI (DISPOSABLE) ×8 IMPLANT
DRAPE COLUMN DVNC XI (DISPOSABLE) ×2 IMPLANT
ELECTRODE REM PT RTRN 9FT ADLT (ELECTROSURGICAL) ×2 IMPLANT
FORCEPS BPLR R/ABLATION 8 DVNC (INSTRUMENTS) ×2 IMPLANT
FORCEPS PROGRASP DVNC XI (FORCEP) ×2 IMPLANT
GLOVE BIO SURGEON STRL SZ7 (GLOVE) ×4 IMPLANT
GOWN STRL REUS W/ TWL LRG LVL3 (GOWN DISPOSABLE) ×8 IMPLANT
IRRIGATION STRYKERFLOW (MISCELLANEOUS) IMPLANT
IV CATH ANGIO 12GX3 LT BLUE (NEEDLE) IMPLANT
KIT PINK PAD W/HEAD ARM REST (MISCELLANEOUS) ×2 IMPLANT
LABEL OR SOLS (LABEL) ×2 IMPLANT
MANIFOLD NEPTUNE II (INSTRUMENTS) ×2 IMPLANT
NDL HYPO 22X1.5 SAFETY MO (MISCELLANEOUS) ×2 IMPLANT
NEEDLE HYPO 22X1.5 SAFETY MO (MISCELLANEOUS) ×2 IMPLANT
NS IRRIG 500ML POUR BTL (IV SOLUTION) ×2 IMPLANT
OBTURATOR OPTICALSTD 8 DVNC (TROCAR) ×2 IMPLANT
PACK LAP CHOLECYSTECTOMY (MISCELLANEOUS) ×2 IMPLANT
SCISSORS ENDO CVD 5DCS (MISCELLANEOUS) IMPLANT
SEAL UNIV 5-12 XI (MISCELLANEOUS) ×8 IMPLANT
SET TUBE SMOKE EVAC HIGH FLOW (TUBING) ×2 IMPLANT
SOLUTION ELECTROSURG ANTI STCK (MISCELLANEOUS) ×2 IMPLANT
SPIKE FLUID TRANSFER (MISCELLANEOUS) ×2 IMPLANT
SPONGE T-LAP 18X18 ~~LOC~~+RFID (SPONGE) ×2 IMPLANT
SPONGE T-LAP 4X18 ~~LOC~~+RFID (SPONGE) IMPLANT
SUT MNCRL AB 4-0 PS2 18 (SUTURE) ×2 IMPLANT
SUT VICRYL 0 UR6 27IN ABS (SUTURE) ×4 IMPLANT
SYR 20ML LL LF (SYRINGE) IMPLANT
SYSTEM BAG RETRIEVAL 10MM (BASKET) ×2 IMPLANT
TRAP FLUID SMOKE EVACUATOR (MISCELLANEOUS) ×2 IMPLANT
WATER STERILE IRR 3000ML UROMA (IV SOLUTION) IMPLANT
WATER STERILE IRR 500ML POUR (IV SOLUTION) ×2 IMPLANT

## 2023-10-24 NOTE — Interval H&P Note (Signed)
 History and Physical Interval Note:  10/24/2023 8:49 AM  Margaret Gates  has presented today for surgery, with the diagnosis of Biliary dyskinesia.  The various methods of treatment have been discussed with the patient and family. After consideration of risks, benefits and other options for treatment, the patient has consented to  Procedure(s) with comments: CHOLECYSTECTOMY, ROBOT-ASSISTED, LAPAROSCOPIC (N/A) - w/ICG as a surgical intervention.  The patient's history has been reviewed, patient examined, no change in status, stable for surgery.  I have reviewed the patient's chart and labs.  Questions were answered to the patient's satisfaction.     [name] F [name]

## 2023-10-24 NOTE — Interval H&P Note (Signed)
 History and Physical Interval Note:  10/24/2023 7:38 AM  Margaret Gates  has presented today for surgery, with the diagnosis of Biliary dyskinesia.  The various methods of treatment have been discussed with the patient and family. After consideration of risks, benefits and other options for treatment, the patient has consented to  Procedure(s) with comments: CHOLECYSTECTOMY, ROBOT-ASSISTED, LAPAROSCOPIC (N/A) - w/ICG as a surgical intervention.  The patient's history has been reviewed, patient examined, no change in status, stable for surgery.  I have reviewed the patient's chart and labs.  Questions were answered to the patient's satisfaction.     Yerik Zeringue F [name]

## 2023-10-24 NOTE — Anesthesia Preprocedure Evaluation (Addendum)
 Anesthesia Evaluation  Patient identified by MRN, date of birth, ID band Patient awake    Reviewed: Allergy & Precautions, H&P , NPO status , Patient's Chart, lab work & pertinent test results  Airway Mallampati: II  TM Distance: >3 FB Neck ROM: full    Dental no notable dental hx.    Pulmonary asthma , Current Smoker and Patient abstained from smoking.   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 01/26/2021: Severe sepsis (HCC) No date: Tobacco use  Past Surgical History: No date: APPENDECTOMY 08/31/2020: CARPAL TUNNEL RELEASE; Right     Comment:  Procedure: CARPAL TUNNEL RELEASE ENDOSCOPIC;  Surgeon:               Edie Norleen PARAS, MD;  Location: [facility] ORS;  Service:               Orthopedics;  Laterality: Right; 10/26/2020: CARPAL TUNNEL RELEASE; Left     Comment:  Procedure: CARPAL TUNNEL RELEASE ENDOSCOPIC;   Surgeon:               Edie Norleen PARAS, MD;  Location: [facility] ORS;  Service:               Orthopedics;  Laterality: Left; 08/22/2021: CARPAL TUNNEL RELEASE; Right     Comment:  Procedure: OPEN REVISION RIGHT CARPAL TUNNEL RELEASE;                Surgeon: Edie Norleen PARAS, MD;  Location: [facility] ORS;                Service: Orthopedics;  Laterality: Right; 11/15/2021: CARPAL TUNNEL RELEASE; Left     Comment:  Procedure: Revision open left carpal tunnel release with              subcutaneous transposition of ulnar nerve at left elbow;               Surgeon: Edie Norleen PARAS, MD;  Location: [facility] ORS;                Service: Orthopedics;  Laterality: Left; No date: CESAREAN SECTION     Comment:  x 2 No date: HIP SURGERY; Bilateral     Comment:  LAPROSCOPIC BONE SPURS No date: HIP SURGERY; Bilateral 08/22/2021: ULNAR NERVE TRANSPOSITION; Right     Comment:  Procedure: SUBCUTANEOUS TRANSPOSITION OF THE ULNAR NERVE  AT RIGHT ELBOW.;  Surgeon: Edie Norleen PARAS, MD;  Location:               [facility] OR[facility];  Service: Orthopedics;  Laterality: Right; 11/15/2021: ULNAR NERVE TRANSPOSITION; Left     Comment:  Procedure: ULNAR NERVE DECOMPRESSION/TRANSPOSITION;                Surgeon: Edie Norleen PARAS, MD;  Location: [facility];                Service: Orthopedics;  Laterality: Left; 2020: UPPER GI ENDOSCOPY  BMI    Body Mass Index: 27.83 kg/m      Reproductive/Obstetrics negative OB ROS                              Anesthesia Physical Anesthesia Plan  ASA: 2  Anesthesia Plan: General ETT   Post-op Pain Management: Tylenol  PO (pre-op)*, Celebrex  PO (pre-op)* and Gabapentin  PO (pre-op)*   Induction: Intravenous  PONV Risk Score and Plan: 2 and Ondansetron , Dexamethasone , Midazolam  and Propofol  infusion  Airway Management Planned: Oral ETT  Additional Equipment:   Intra-op Plan:   Post-operative Plan: Extubation in OR  Informed Consent: I have reviewed the patients  History and Physical, chart, labs and discussed the procedure including the risks, benefits and alternatives for the proposed anesthesia with the patient or authorized representative who has indicated his/her understanding and acceptance.     Dental Advisory Given  Plan Discussed with: CRNA and Surgeon  Anesthesia Plan Comments: (Pre-op stress dose)         Anesthesia Quick Evaluation

## 2023-10-24 NOTE — Anesthesia Postprocedure Evaluation (Signed)
 Anesthesia Post Note  Patient: Hannalee Whitt Holten  Procedure(s) Performed: CHOLECYSTECTOMY, ROBOT-ASSISTED, LAPAROSCOPIC INDOCYANINE GREEN FLUORESCENCE IMAGING (ICG)  Patient location during evaluation: PACU Anesthesia Type: General Level of consciousness: awake and alert Pain management: pain level controlled Vital Signs Assessment: post-procedure vital signs reviewed and stable Respiratory status: spontaneous breathing, nonlabored ventilation, respiratory function stable and patient connected to nasal cannula oxygen Cardiovascular status: blood pressure returned to baseline and stable Postop Assessment: no apparent nausea or vomiting Anesthetic complications: no   No notable events documented.   Last Vitals:  Vitals:   10/24/23 1045 10/24/23 1050  BP: (!) 126/91   Pulse: 99 (!) 102  Resp: 17 16  Temp:    SpO2: 94% 93%    Last Pain:  Vitals:   10/24/23 1104  TempSrc:   PainSc: 7                  Lendia LITTIE Mae

## 2023-10-24 NOTE — Anesthesia Procedure Notes (Signed)
 Procedure Name: Intubation Date/Time: 10/24/2023 9:23 AM  Performed by: Jackye Spanner, CRNAPre-anesthesia Checklist: Patient identified, Patient being monitored, Timeout performed, Emergency Drugs available and Suction available Patient Re-evaluated:Patient Re-evaluated prior to induction Oxygen Delivery Method: Circle system utilized Preoxygenation: Pre-oxygenation with 100% oxygen Induction Type: IV induction Ventilation: Mask ventilation without difficulty Laryngoscope Size: 3 and McGrath Grade View: Grade I Tube type: Oral Tube size: 7.0 mm Number of attempts: 1 Airway Equipment and Method: Stylet Placement Confirmation: ETT inserted through vocal cords under direct vision, positive ETCO2 and breath sounds checked- equal and bilateral Secured at: 21 cm Tube secured with: Tape Dental Injury: Teeth and Oropharynx as per pre-operative assessment  Comments: Smooth atraumatic intubation, no complications noted.

## 2023-10-24 NOTE — Op Note (Signed)
 Robotic assisted laparoscopic Cholecystectomy  Pre-operative Diagnosis: biliary dyskinesia  Post-operative Diagnosis: same  Procedure:  Robotic assisted laparoscopic Cholecystectomy  Surgeon: Laneta Luna, MD FACS  Anesthesia: Gen. with endotracheal tube  Findings: Adhesion from omentum to abd wall Chronic mild Cholecystitis   Estimated Blood Loss: cc       Specimens: Gallbladder           Complications: none   Procedure Details  The patient was seen again in the Holding Room. The benefits, complications, treatment options, and expected outcomes were discussed with the patient. The risks of bleeding, infection, recurrence of symptoms, failure to resolve symptoms, bile duct damage, bile duct leak, retained common bile duct stone, bowel injury, any of which could require further surgery and/or ERCP, stent, or papillotomy were reviewed with the patient. The likelihood of improving the patient's symptoms with return to their baseline status is good.  The patient and/or family concurred with the proposed plan, giving informed consent.  The patient was taken to Operating Room, identified  and the procedure verified as Laparoscopic Cholecystectomy.  A Time Out was held and the above information confirmed.  Prior to the induction of general anesthesia, antibiotic prophylaxis was administered. VTE prophylaxis was in place. General endotracheal anesthesia was then administered and tolerated well. After the induction, the abdomen was prepped with Chloraprep and draped in the sterile fashion. The patient was positioned in the supine position.  Cut down technique was used to enter the abdominal cavity and a Hasson trochar was placed after two vicryl stitches were anchored to the fascia. Pneumoperitoneum was then created with CO2 and tolerated well without any adverse changes in the patient's vital signs.  Three 8-mm ports were placed under direct vision. All skin incisions  were infiltrated with a  local anesthetic agent before making the incision and placing the trocars.   The patient was positioned  in reverse Trendelenburg, robot was brought to the surgical field and docked in the standard fashion.  We made sure all the instrumentation was kept indirect view at all times and that there were no collision between the arms. There was adhesions and we performed lysis of adhesions with scissors sharply laparoscopically before docking, we had a good dissecting window. I scrubbed out and went to the console.  The gallbladder was identified, the fundus grasped and retracted cephalad. Adhesions were lysed bluntly. The infundibulum was grasped and retracted laterally, exposing the peritoneum overlying the triangle of Calot. This was then divided and exposed in a blunt fashion. An extended critical view of the cystic duct and cystic artery was obtained.  The cystic duct was clearly identified and bluntly dissected.   Artery and duct were double clipped and divided. Using ICG cholangiography we visualize the cystic duct and cbd w/o evidence of bile injuries. The gallbladder was taken from the gallbladder fossa in a retrograde fashion with the electrocautery.  Hemostasis was achieved with the electrocautery. nspection of the right upper quadrant was performed. No bleeding, bile duct injury or leak, or bowel injury was noted. Robotic instruments and robotic arms were undocked in the standard fashion.  I scrubbed back in.  The gallbladder was removed and placed in an Endocatch bag.   Pneumoperitoneum was released.  The periumbilical port site was closed with interrumpted 0 Vicryl sutures. 4-0 subcuticular Monocryl was used to close the skin. Dermabond was  applied.  The patient was then extubated and brought to the recovery room in stable condition. Sponge, lap, and needle counts were correct  at closure and at the conclusion of the case.               Laneta Luna, MD, FACS

## 2023-10-24 NOTE — Discharge Instructions (Addendum)

## 2023-10-24 NOTE — Transfer of Care (Signed)
 Immediate Anesthesia Transfer of Care Note  Patient: Margaret Gates  Procedure(s) Performed: CHOLECYSTECTOMY, ROBOT-ASSISTED, LAPAROSCOPIC INDOCYANINE GREEN FLUORESCENCE IMAGING (ICG)  Patient Location: PACU  Anesthesia Type:General  Level of Consciousness: drowsy  Airway & Oxygen Therapy: Patient Spontanous Breathing and Patient connected to face mask oxygen  Post-op Assessment: Report given to RN and Post -op Vital signs reviewed and stable  Post vital signs: Reviewed and stable  Last Vitals:  Vitals Value Taken Time  BP 110/73 10/24/23 10:15  Temp 36.2 C 10/24/23 10:15  Pulse 108 10/24/23 10:27  Resp 27 10/24/23 10:27  SpO2 100 % 10/24/23 10:27  Vitals shown include unfiled device data.  Last Pain:  Vitals:   10/24/23 0753  TempSrc: Temporal  PainSc: 0-No pain         Complications: No notable events documented.

## 2023-10-25 ENCOUNTER — Telehealth: Payer: Self-pay | Admitting: Surgery

## 2023-10-25 ENCOUNTER — Encounter: Payer: Self-pay | Admitting: Surgery

## 2023-10-25 NOTE — Telephone Encounter (Signed)
 Patient calls, she had robotic cholecystectomy done on 10/24/23 Dr. Jordis. Patient states the Hydrocodone  not working at all, wants to know what else she can do?  Please call patient

## 2023-10-29 ENCOUNTER — Encounter: Payer: Self-pay | Admitting: Nurse Practitioner

## 2023-10-29 ENCOUNTER — Ambulatory Visit: Admitting: Gastroenterology

## 2023-10-29 LAB — SURGICAL PATHOLOGY

## 2023-10-31 ENCOUNTER — Encounter: Payer: Self-pay | Admitting: *Deleted

## 2023-11-04 ENCOUNTER — Other Ambulatory Visit: Payer: Self-pay

## 2023-11-04 ENCOUNTER — Telehealth: Payer: Self-pay | Admitting: Surgery

## 2023-11-04 DIAGNOSIS — Z09 Encounter for follow-up examination after completed treatment for conditions other than malignant neoplasm: Secondary | ICD-10-CM

## 2023-11-04 MED ORDER — GABAPENTIN 300 MG PO CAPS: 300.0000 mg | ORAL_CAPSULE | Freq: Three times a day (TID) | ORAL | 0 refills | Status: DC

## 2023-11-04 NOTE — Telephone Encounter (Signed)
 Pt said that she needs to talk with the dr instead of the nurse about she does not think that she can go back to work according to the note that she has. She says that she is very tired with no energy. She feels sick to her stomach when she has been up and down and she has to do this a lot at her job. She does not feel that she can go back to work tomorrow. Thinks that she would rather wait till her post op appt before going back to work after seeing the dr. Please ask the Dr to give her call. Thinks that she needs a different note till after post op. Pt# is [phone]

## 2023-11-04 NOTE — Telephone Encounter (Signed)
 I called pt since she had concerns about pain and constipation. 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 I added gabapentin  for neuralgia. A well.

## 2023-11-13 ENCOUNTER — Encounter: Payer: Self-pay | Admitting: Nurse Practitioner

## 2023-11-13 ENCOUNTER — Ambulatory Visit (INDEPENDENT_AMBULATORY_CARE_PROVIDER_SITE_OTHER): Admitting: Physician Assistant

## 2023-11-13 ENCOUNTER — Encounter: Payer: Self-pay | Admitting: Physician Assistant

## 2023-11-13 VITALS — BP 119/84 | HR 84 | Ht 66.0 in | Wt 172.0 lb

## 2023-11-13 DIAGNOSIS — K828 Other specified diseases of gallbladder: Secondary | ICD-10-CM

## 2023-11-13 DIAGNOSIS — Z09 Encounter for follow-up examination after completed treatment for conditions other than malignant neoplasm: Secondary | ICD-10-CM

## 2023-11-13 NOTE — Progress Notes (Signed)
 [facility] POST-OP OFFICE VISIT  11/13/2023  HPI: Margaret Gates is a 43 y.o. female 20 days s/p robotic assisted laparoscopic cholecystectomy for biliary dyskinesia with Dr Jordis  She is doing alright Her abdominal pain is improving; mostly to the right lateral incision from time to time No fever, chills, nausea, emesis She reports more frequent diarrhea now; has been taking stool softeners. Of note, she reports history of IBS and previously with constipation She is tolerating PO Incisions are well healed   Vital signs: BP 119/84   Pulse 84   Ht 5' 6 (1.676 m)   Wt 172 lb (78 kg)   SpO2 99%   BMI 27.76 kg/m    Physical Exam: Constitutional: Well appearing female, NAD Abdomen: Soft, non-tender, non-distended, no rebound/guarding Skin: Laparoscopic incisions are healing well, no erythema or drainage   Assessment/Plan: This is a 43 y.o. female 20 days s/p robotic assisted laparoscopic cholecystectomy for biliary dyskinesia with Dr Jordis   - Encouraged Imodium if she is having diarrhea daily; Stop stool softeners. She understands it may be hard to determine if diarrhea is secondary to her IBS or her cholecystectomy.   - Pain control prn; OTC medications from a surgical perspective at this point  - Reviewed wound care recommendation  - Reviewed lifting restrictions; 4 weeks total; return to work note given   - Reviewed surgical pathology; Chronic Cholecystitis   - She can follow up on as needed basis; She understands to call with questions/concerns  -- Arthea Platt, PA-C [facility] 11/13/2023, 2:05 PM M-F: 7am - 4pm

## 2023-11-13 NOTE — Patient Instructions (Signed)

## 2023-11-22 ENCOUNTER — Ambulatory Visit: Admitting: Physician Assistant

## 2023-12-04 ENCOUNTER — Other Ambulatory Visit: Payer: Self-pay

## 2023-12-06 ENCOUNTER — Other Ambulatory Visit: Payer: Self-pay

## 2023-12-24 ENCOUNTER — Encounter: Payer: Self-pay | Admitting: Nurse Practitioner

## 2023-12-24 ENCOUNTER — Ambulatory Visit: Admitting: Nurse Practitioner

## 2023-12-24 VITALS — BP 130/88 | HR 91 | Temp 98.3°F | Ht 66.0 in | Wt 178.0 lb

## 2023-12-24 DIAGNOSIS — M25511 Pain in right shoulder: Secondary | ICD-10-CM | POA: Diagnosis not present

## 2023-12-24 DIAGNOSIS — Z23 Encounter for immunization: Secondary | ICD-10-CM

## 2023-12-24 MED ORDER — CELECOXIB 100 MG PO CAPS: 100.0000 mg | ORAL_CAPSULE | Freq: Two times a day (BID) | ORAL | 0 refills | Status: AC

## 2023-12-24 NOTE — Progress Notes (Signed)
 "  BP 130/88   Pulse 91   Temp 98.3 F (36.8 C)   Ht 5' 6 (1.676 m)   Wt 178 lb (80.7 kg)   SpO2 98%   BMI 28.73 kg/m    Subjective:    Patient ID: Margaret Gates, female    DOB: 05-31-1980, 43 y.o.   MRN: [id number]  HPI: Margaret Gates is a 43 y.o. female  Chief Complaint  Patient presents with   Shoulder Pain    Pt c/o right shoulder pain x2 days. Pain located in scapular region.    Discussed the use of AI scribe software for clinical note transcription with the patient, who gave verbal consent to proceed.  History of Present Illness Margaret Gates is a 43 year old female who presents with right shoulder pain. Her husband is present at the clinic.  Right shoulder pain - Onset two days ago - Burning sensation radiating up the back, localized around the right shoulder blade - Aggravated by sensation of shoulder 'popping in and out' - Ibuprofen  1000 mcg provided some relief - Currently taking steroids - Previous trial of gabapentin  without significant improvement - Muscle relaxers not tolerated due to nausea - Heat application worsened symptoms - No trial of ice therapy  Immunization request - Requesting influenza and pneumonia vaccinations during this visit  Emotional distress - Experiencing emotional distress due to the recent passing of a friend from cancer         05/16/2023   10:05 AM 12/10/2022   11:41 AM 09/19/2022    9:03 AM  Depression screen PHQ 2/9  Decreased Interest 0 0 0  Down, Depressed, Hopeless 0 0 0  PHQ - 2 Score 0 0 0  Altered sleeping 0  0  Tired, decreased energy 0  0  Change in appetite 0  0  Feeling bad or failure about yourself  0  0  Trouble concentrating 0  0  Moving slowly or fidgety/restless 0  0  Suicidal thoughts 0  0  PHQ-9 Score 0   0   Difficult doing work/chores Not difficult at all  Not difficult at all     Data saved with a previous flowsheet row definition    Relevant past medical, surgical,  family and social history reviewed and updated as indicated. Interim medical history since our last visit reviewed. Allergies and medications reviewed and updated.  Review of Systems  Ten systems reviewed and is negative except as mentioned in HPI      Objective:      BP 130/88   Pulse 91   Temp 98.3 F (36.8 C)   Ht 5' 6 (1.676 m)   Wt 178 lb (80.7 kg)   SpO2 98%   BMI 28.73 kg/m    Wt Readings from Last 3 Encounters:  12/24/23 178 lb (80.7 kg)  11/13/23 172 lb (78 kg)  10/24/23 172 lb 6.4 oz (78.2 kg)    Physical Exam GENERAL: Alert, cooperative, well developed, no acute distress HEENT: Normocephalic, normal oropharynx, moist mucous membranes CHEST: Clear to auscultation bilaterally, no wheezes, rhonchi, or crackles CARDIOVASCULAR: Normal heart rate and rhythm, S1 and S2 normal without murmurs ABDOMEN: Soft, non-tender, non-distended, without organomegaly, normal bowel sounds EXTREMITIES: No cyanosis or edema MUSCULOSKELETAL: Right shoulder blade pain on palpation. No pain in joint NEUROLOGICAL: Cranial nerves grossly intact, moves all extremities without gross motor or sensory deficit  Results for orders placed or performed during the hospital encounter of 10/24/23  Surgical  pathology   Collection Time: 10/24/23 12:00 AM  Result Value Ref Range   SURGICAL PATHOLOGY      SURGICAL PATHOLOGY [facility] [address], [address] [place], [place] [address] Telephone [phone] or [phone] Fax [phone]  REPORT OF SURGICAL PATHOLOGY   Accession #: [id number] Patient Name: Margaret Gates, Margaret Gates Visit # : [id number]  MRN: [id number] Physician: Jordis Cliche DOB/Age [birth year] (Age: 3) Gender: F Collected Date: 10/24/2023 Received Date: 10/24/2023  FINAL DIAGNOSIS       1. Gallbladder,  :       - MILD CHRONIC CHOLECYSTITIS.       DATE SIGNED OUT: 10/29/2023 ELECTRONIC SIGNATURE : Coronel Md, Misti, Sports Administrator, Copywriter, Advertising  MICROSCOPIC DESCRIPTION  CASE COMMENTS STAINS USED IN DIAGNOSIS: H&E    CLINICAL HISTORY  SPECIMEN(S) OBTAINED 1. Gallbladder,  SPECIMEN COMMENTS: SPECIMEN CLINICAL INFORMATION: 1. Biliary dyskinesia    Gross Description 1. Size/?Intact: 8.2 x 3.3 x 3.0 cm, intact      Serosal surface: Green-purple, roughened with tan-yellow adhesions      Mucosa/Wall: G reen-brown, attenuated, wall thickness - <0.1cm      Contents: Green bile, no stones      Cystic duct: No stones present      Block Summary: 1A - cystic duct margin, en face, representative gallbladder      ks/mb 10/25/23        Report signed out from the following location(s) [facility] [address] [place], [place] [address] CLIA #: [id number]  [facility] [address] [place], [place] [address] CLIA #: [id number]   Pregnancy, urine POC   Collection Time: 10/24/23  7:47 AM  Result Value Ref Range   Preg Test, Ur NEGATIVE NEGATIVE          Assessment & Plan:   Problem List Items Addressed This Visit   None Visit Diagnoses       Immunization due    -  Primary   Relevant Orders   Flu vaccine trivalent PF, 6mos and older(Flulaval,Afluria,Fluarix,Fluzone) (Completed)   Pneumococcal conjugate vaccine 20-valent (Prevnar 20) (Completed)     Acute pain of right shoulder       Relevant Medications   celecoxib  (CELEBREX ) 100 MG capsule        Assessment and Plan Assessment & Plan Acute pain of right shoulder Acute right shoulder pain for two days, described as a popping sensation and burning sensation radiating to the back. Pain is localized to the shoulder blade area. Previous use of ibuprofen  provided some relief. Gabapentin  was ineffective. Muscle relaxers are not tolerated due to nausea. Heat application caused nausea, suggesting a possible exacerbation of symptoms. - Prescribed Celebrex  to be taken twice daily, avoiding concurrent use with  ibuprofen . - Advised use of ice instead of heat for pain management. - Instructed to report back on the effectiveness of Celebrex   General Health Maintenance She expressed interest in receiving flu and pneumonia vaccinations during the visit. - Administered flu and pneumonia vaccinations.        Follow up plan: Return if symptoms worsen or fail to improve. "

## 2024-01-01 ENCOUNTER — Other Ambulatory Visit: Payer: Self-pay | Admitting: Physician Assistant

## 2024-01-01 DIAGNOSIS — K582 Mixed irritable bowel syndrome: Secondary | ICD-10-CM

## 2024-01-01 DIAGNOSIS — R1011 Right upper quadrant pain: Secondary | ICD-10-CM

## 2024-01-03 ENCOUNTER — Other Ambulatory Visit: Payer: Self-pay

## 2024-01-13 ENCOUNTER — Ambulatory Visit: Admitting: Physician Assistant

## 2024-01-13 ENCOUNTER — Encounter: Payer: Self-pay | Admitting: Physician Assistant

## 2024-01-13 VITALS — BP 114/76 | HR 118 | Ht 66.0 in | Wt 178.1 lb

## 2024-01-13 DIAGNOSIS — K9089 Other intestinal malabsorption: Secondary | ICD-10-CM | POA: Diagnosis not present

## 2024-01-13 DIAGNOSIS — K219 Gastro-esophageal reflux disease without esophagitis: Secondary | ICD-10-CM | POA: Diagnosis not present

## 2024-01-13 MED ORDER — CHOLESTYRAMINE 4 G PO PACK: 4.0000 g | PACK | Freq: Every day | ORAL | 5 refills | Status: AC

## 2024-01-13 NOTE — Patient Instructions (Signed)
 We have sent the following medications to your pharmacy for you to pick up at your convenience: Cholestyramine  4g packet daily.   Please mix 1 4g packet in 4oz of clear liquids of your choice once daily. DO NOT take this within 4 hours of any other medication.  Please follow up sooner if symptoms increase or worsen  Due to recent changes in healthcare laws, you may see the results of your imaging and laboratory studies on MyChart before your provider has had a chance to review them.  We understand that in some cases there may be results that are confusing or concerning to you. Not all laboratory results come back in the same time frame and the provider may be waiting for multiple results in order to interpret others.  Please give us  48 hours in order for your provider to thoroughly review all the results before contacting the office for clarification of your results.   Thank you for trusting me with your gastrointestinal care!   Ellouise Console, PA-C _______________________________________________________  If your blood pressure at your visit was 140/90 or greater, please contact your primary care physician to follow up on this.  _______________________________________________________  If you are age 45 or older, your body mass index should be between 23-30. Your Body mass index is 28.75 kg/m. If this is out of the aforementioned range listed, please consider follow up with your Primary Care Provider.  If you are age 27 or younger, your body mass index should be between 19-25. Your Body mass index is 28.75 kg/m. If this is out of the aformentioned range listed, please consider follow up with your Primary Care Provider.   ________________________________________________________  The [facility] providers would like to encourage you to use MYCHART to communicate with providers for non-urgent requests or questions.  Due to long hold times on the telephone, sending your provider a message by [facility]  may be a faster and more efficient way to get a response.  Please allow 48 business hours for a response.  Please remember that this is for non-urgent requests.  _______________________________________________________

## 2024-01-13 NOTE — Progress Notes (Signed)
 "     Margaret Console, PA-C [address] [place], [place]  [address] Phone: [phone]   Primary Care Physician: Gareth Mliss FALCON, FNP  Primary Gastroenterologist:  Margaret Console, PA-C / Glendia Holt, MD   Chief Complaint: Follow-up GERD, IBS, chronic abdominal pain       HPI:   Discussed the use of AI scribe software for clinical note transcription with the patient, who gave verbal consent to proceed.  I last saw patient 10/07/2023 to evaluate 5-year history of RUQ pain, IBS, GERD.  Was having constipation and diarrhea.  Continued on omeprazole 20 mg twice daily for GERD.  Continue on dicyclomine  10 mg 3 times daily for IBS.  10/17/2023 HIDA scan: Decreased gallbladder ejection fraction 29%, consistent with chronic cholecystitis or biliary dyskinesia.  She was referred to general surgery for cholecystectomy.  She underwent cholecystectomy 10/24/2023 by Dr. Jordis.  Gallbladder pathology showed mild chronic cholecystitis.  History of Present Illness Margaret Gates is a 44 year old female status post cholecystectomy who presents for follow-up of post-surgical diarrhea.  Post-Cholecystectomy Gastrointestinal Symptoms: - Underwent cholecystectomy two months ago for five years of right upper quadrant abdominal pain - Right upper quadrant pain has completely resolved since surgery - No recurrence of right upper quadrant pain - Experiences intermittent diarrhea, particularly after eating certain foods, especially fatty foods - Diarrhea described as the runs and sometimes mucousy - No blood in stool observed, though she has not specifically looked for it - Some foods cause rapid transit through the gastrointestinal tract - Reports upper abdominal discomfort when wearing a bra, prefers loose clothing for comfort  Medication Use and Dietary Modifications: - Initially took dicyclomine  for post-surgical abdominal pain and cramping; reduced frequency to once daily or less as  pain resolved; no longer feels it is necessary - Continues omeprazole for gastroesophageal reflux - Works in education officer, environmental and avoids fried foods, as these previously worsened symptoms  Other Gastrointestinal and Systemic Symptoms: - No other gastrointestinal or systemic complaints  10/01/2023 RUQ ultrasound: Normal liver and gallbladder.  No gallstones.  Normal CBD 3 mm.   05/2023 CT abdomen pelvis with contrast: No acute abnormality.  Mildly dilated pelvic vessels, possible pelvic congestion syndrome.  Aortic atherosclerosis.  No other abnormality.   08/2018 EGD: Done at [facility].  Report not available.  Pathology report showed biopsies negative for celiac and H. pylori.  Esophageal biopsies positive for Candida esophagitis.  Normal stomach and duodenal biopsies.  Patient not denies dysphagia.   Past medical history significant for adrenal insufficiency followed by endocrinologist at [facility].  Patient requires hydrocortisone  100 mg prior to sedation for surgery or procedures.  Patient has history of asthma, current smoker.  Does not have pulmonologist.  Current Outpatient Medications  Medication Sig Dispense Refill   albuterol  (VENTOLIN  HFA) 108 (90 Base) MCG/ACT inhaler Inhale 2 puffs into the lungs every 6 (six) hours as needed for wheezing or shortness of breath. 6.7 g 3   celecoxib  (CELEBREX ) 100 MG capsule Take 1 capsule (100 mg total) by mouth 2 (two) times daily. 60 capsule 0   cholestyramine  (QUESTRAN ) 4 g packet Take 1 packet (4 g total) by mouth daily. 30 packet 5   dicyclomine  (BENTYL ) 10 MG capsule Take 1 capsule (10 mg total) by mouth 3 (three) times daily before meals. 90 capsule 3   fludrocortisone  (FLORINEF ) 0.1 MG tablet Take 0.1 mg by mouth daily.     hydrocortisone  (CORTEF ) 10 MG tablet Take 30 mg by  mouth 2 (two) times daily. 20 mg in am and 10 in pm     omeprazole (PRILOSEC) 20 MG capsule Take 20 mg by mouth daily.     No current facility-administered medications for this  visit.    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   GERD (gastroesophageal reflux disease)    Headache    Hiatal hernia    History of 2019 novel coronavirus disease (COVID-19) 02/08/2022   a.) PCR (+) 02/08/2022, 12/10/2022   IBS (irritable bowel syndrome)    Pneumonia    Rathke's cleft cyst    Severe sepsis (HCC) 01/26/2021   Tobacco use     Past Surgical History:  Procedure Laterality Date   APPENDECTOMY     CARPAL TUNNEL RELEASE Right 08/31/2020   Procedure: CARPAL TUNNEL RELEASE ENDOSCOPIC;  Surgeon: Edie Norleen PARAS, MD;  Location: [facility];  Service: Orthopedics;  Laterality: Right;   CARPAL TUNNEL RELEASE Left 10/26/2020   Procedure: CARPAL TUNNEL RELEASE ENDOSCOPIC;  Surgeon: Edie Norleen PARAS, MD;  Location: [facility];  Service: Orthopedics;  Laterality: Left;   CARPAL TUNNEL RELEASE Right 08/22/2021   Procedure: OPEN REVISION RIGHT CARPAL TUNNEL RELEASE;  Surgeon: Edie Norleen PARAS, MD;  Location: [facility];  Service: Orthopedics;  Laterality: Right;   CARPAL TUNNEL RELEASE Left 11/15/2021   Procedure: Revision open left carpal tunnel release with subcutaneous transposition of ulnar nerve at left elbow;  Surgeon: Edie Norleen PARAS, MD;  Location: [facility];  Service: Orthopedics;  Laterality: Left;   CESAREAN SECTION     x 2   CHOLECYSTECTOMY     HIP SURGERY Bilateral    LAPROSCOPIC BONE SPURS   HIP SURGERY Bilateral    INDOCYANINE GREEN  FLUORESCENCE IMAGING (ICG)  10/24/2023   Procedure: INDOCYANINE GREEN  FLUORESCENCE IMAGING (ICG);   Surgeon: Jordis Laneta FALCON, MD;  Location: [facility];  Service: General;;   TUBAL LIGATION     ULNAR NERVE TRANSPOSITION Right 08/22/2021   Procedure: SUBCUTANEOUS TRANSPOSITION OF THE ULNAR NERVE AT RIGHT ELBOW.;  Surgeon: Edie Norleen PARAS, MD;  Location: [facility];  Service: Orthopedics;  Laterality: Right;   ULNAR NERVE TRANSPOSITION Left 11/15/2021   Procedure: ULNAR NERVE DECOMPRESSION/TRANSPOSITION;  Surgeon: Edie Norleen PARAS, MD;  Location: [facility];  Service: Orthopedics;  Laterality: Left;   UPPER GI ENDOSCOPY  2020    Review of Systems:    All systems reviewed and negative except where noted in HPI.    Physical Exam:  BP 114/76   Pulse (!) 118   Ht 5' 6 (1.676 m)   Wt 178 lb 2 oz (80.8 kg)   BMI 28.75 kg/m  No LMP recorded. Patient is perimenopausal.  General: Well-nourished, well-developed in no acute distress.  Lungs: Clear to auscultation bilaterally. Non-labored. Heart: Regular rate and rhythm, no murmurs rubs or gallops.  Abdomen: Bowel sounds are normal; Abdomen is Soft; No hepatosplenomegaly, masses or hernias;  No Abdominal Tenderness; No guarding or rebound tenderness. Neuro: Alert and oriented x 3.  Grossly intact.  Psych: Alert and cooperative, normal mood and affect.   Imaging Studies: No results found.  Labs: CBC    Component Value Date/Time   WBC 10.3 10/01/2023 0843   RBC 4.66 10/01/2023 0843   HGB 15.3 10/01/2023 0843   HGB 14.7 07/22/2013 0940   HCT 47.2 (H) 10/01/2023 0843   HCT 45.1 07/22/2013 0940   PLT 377 10/01/2023 0843   PLT 253 07/22/2013 0940   MCV 101.3 (H) 10/01/2023 0843   MCV 100 07/22/2013 0940   MCH 32.8 10/01/2023 0843   MCHC 32.4 10/01/2023 0843   RDW 11.5 10/01/2023 0843   RDW 12.2 07/22/2013 0940   LYMPHSABS 2.0 05/16/2023 1055   LYMPHSABS 2.8 07/22/2013 0940   MONOABS 0.7 05/16/2023 1055   MONOABS 0.7 07/22/2013 0940   EOSABS 968 (H) 10/01/2023 0843   EOSABS 0.5 07/22/2013 0940   BASOSABS 103 10/01/2023 0843   BASOSABS 0.0  07/22/2013 0940    CMP     Component Value Date/Time   NA 139 10/01/2023 0843   K 4.2 10/01/2023 0843   CL 103 10/01/2023 0843   CO2 28 10/01/2023 0843   GLUCOSE 90 10/01/2023 0843   BUN 8 10/01/2023 0843   CREATININE 0.82 10/01/2023 0843   CALCIUM 10.2 10/01/2023 0843   PROT 7.1 10/01/2023 0843   ALBUMIN 4.5 05/16/2023 1055   AST 9 (L) 10/01/2023 0843   ALT 14 10/01/2023 0843   ALKPHOS 53 05/16/2023 1055   BILITOT 0.6 10/01/2023 0843   GFRNONAA >60 05/16/2023 1055   GFRAA >60 01/03/2017 1416       Assessment and Plan:   Margaret Gates is a 44 y.o. y/o female   RUQ pain resolved s/p cholecystectomy - Discontinue dicyclomine  as she is not having pain any longer.  Bile Salt diarrhea postcholecystectomy - Rx cholestyramine  powder 1 packet daily, #30, 5 refills  3.  Colon Cancer Screening - First Screening Colonoscopy will be due 08/2024 at age 13.  4.  GERD - Continue Prilosec 20mg  daily. Assessment & Plan Bile salt-induced diarrhea Intermittent post-cholecystectomy diarrhea affecting quality of life. Expected improvement within months. Discussed cholestyramine  for symptom management. - Prescribed cholestyramine  powder, one packet daily with breakfast. - Advised dose titration to twice daily or decrease to half a packet based on bowel habits. - Instructed to discontinue if constipation occurs. - Provided guidance on expected resolution within months post-cholecystectomy. - Cholestyramine  for symptomatic control, use as needed.  Gastroesophageal reflux disease Continue omeprazole as prescribed.    Margaret Console, PA-C  Follow up 08/2024 for for screening colonoscopy.   "

## 2024-01-17 ENCOUNTER — Encounter: Payer: Self-pay | Admitting: Nurse Practitioner

## 2024-01-19 NOTE — Progress Notes (Signed)
 Agree with the assessment and plan as outlined by Brigitte Canard, PA-C.
